# Patient Record
Sex: Male | Born: 1950 | Race: Black or African American | Hispanic: No | Marital: Single | Smoking: Former smoker
Health system: Southern US, Community
[De-identification: ages and names within clinical notes are randomized; demographics above are authoritative.]

## PROBLEM LIST (undated history)

## (undated) DIAGNOSIS — J302 Other seasonal allergic rhinitis: Secondary | ICD-10-CM

## (undated) DIAGNOSIS — R208 Other disturbances of skin sensation: Secondary | ICD-10-CM

## (undated) DIAGNOSIS — J301 Allergic rhinitis due to pollen: Secondary | ICD-10-CM

## (undated) DIAGNOSIS — M069 Rheumatoid arthritis, unspecified: Secondary | ICD-10-CM

## (undated) DIAGNOSIS — J939 Pneumothorax, unspecified: Secondary | ICD-10-CM

## (undated) DIAGNOSIS — Z823 Family history of stroke: Secondary | ICD-10-CM

## (undated) DIAGNOSIS — J3089 Other allergic rhinitis: Secondary | ICD-10-CM

## (undated) DIAGNOSIS — Z833 Family history of diabetes mellitus: Secondary | ICD-10-CM

## (undated) DIAGNOSIS — F17220 Nicotine dependence, chewing tobacco, uncomplicated: Secondary | ICD-10-CM

## (undated) DIAGNOSIS — E538 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: Secondary | ICD-10-CM

## (undated) DIAGNOSIS — J449 Chronic obstructive pulmonary disease, unspecified: Secondary | ICD-10-CM

## (undated) DIAGNOSIS — I509 Heart failure, unspecified: Secondary | ICD-10-CM

## (undated) HISTORY — DX: Family history of diabetes mellitus: Z83.3

## (undated) HISTORY — DX: Family history of stroke: Z82.3

## (undated) HISTORY — DX: Nicotine dependence, chewing tobacco, uncomplicated: F17.220

## (undated) HISTORY — DX: Rheumatoid arthritis, unspecified: M06.9

## (undated) HISTORY — DX: Other disturbances of skin sensation: R20.8

## (undated) HISTORY — DX: Other allergic rhinitis: J30.89

## (undated) HISTORY — DX: Allergic rhinitis due to pollen: J30.1

## (undated) HISTORY — DX: 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: E53.8

## (undated) HISTORY — DX: Chronic obstructive pulmonary disease, unspecified: J44.9

## (undated) HISTORY — DX: Other seasonal allergic rhinitis: J30.2

## (undated) HISTORY — PX: DENTAL SURGERY: SHX609

---
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
## 2007-01-12 ENCOUNTER — Emergency Department (HOSPITAL_COMMUNITY): Admission: EM | Admit: 2007-01-12 | Discharge: 2007-01-12 | Payer: Self-pay | Admitting: Emergency Medicine

## 2009-09-08 DIAGNOSIS — Z833 Family history of diabetes mellitus: Secondary | ICD-10-CM

## 2009-09-08 DIAGNOSIS — M069 Rheumatoid arthritis, unspecified: Secondary | ICD-10-CM

## 2009-09-08 DIAGNOSIS — Z823 Family history of stroke: Secondary | ICD-10-CM

## 2009-09-08 DIAGNOSIS — J301 Allergic rhinitis due to pollen: Secondary | ICD-10-CM

## 2009-09-08 HISTORY — DX: Rheumatoid arthritis, unspecified: M06.9

## 2009-09-08 HISTORY — DX: Family history of stroke: Z82.3

## 2009-09-08 HISTORY — DX: Allergic rhinitis due to pollen: J30.1

## 2009-09-08 HISTORY — DX: Family history of diabetes mellitus: Z83.3

## 2011-11-07 DIAGNOSIS — E538 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: Secondary | ICD-10-CM

## 2011-11-07 HISTORY — DX: 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: E53.8

## 2012-05-10 ENCOUNTER — Emergency Department (HOSPITAL_COMMUNITY)
Admission: EM | Admit: 2012-05-10 | Discharge: 2012-05-10 | Disposition: A | Discharge status: Home / Self Care | Payer: Managed Care, Other (non HMO) | Attending: Emergency Medicine | Admitting: Emergency Medicine

## 2012-05-10 ENCOUNTER — Emergency Department (HOSPITAL_COMMUNITY): Payer: Managed Care, Other (non HMO)

## 2012-05-10 ENCOUNTER — Encounter (HOSPITAL_COMMUNITY): Payer: Self-pay | Admitting: *Deleted

## 2012-05-10 DIAGNOSIS — S61412A Laceration without foreign body of left hand, initial encounter: Secondary | ICD-10-CM

## 2012-05-10 DIAGNOSIS — S61409A Unspecified open wound of unspecified hand, initial encounter: Secondary | ICD-10-CM | POA: Insufficient documentation

## 2012-05-10 DIAGNOSIS — W298XXA Contact with other powered powered hand tools and household machinery, initial encounter: Secondary | ICD-10-CM | POA: Insufficient documentation

## 2012-05-10 DIAGNOSIS — Z23 Encounter for immunization: Secondary | ICD-10-CM | POA: Insufficient documentation

## 2012-05-10 MED ORDER — TETANUS-DIPHTH-ACELL PERTUSSIS 5-2.5-18.5 LF-MCG/0.5 IM SUSP
0.5000 mL | Freq: Once | INTRAMUSCULAR | Status: AC
  Administered 2012-05-10: 0.5 mL via INTRAMUSCULAR

## 2012-05-10 MED ORDER — TETANUS-DIPHTH-ACELL PERTUSSIS 5-2.5-18.5 LF-MCG/0.5 IM SUSP
INTRAMUSCULAR | Status: AC
  Filled 2012-05-10: qty 0.5

## 2012-05-10 MED ORDER — LIDOCAINE HCL (PF) 1 % IJ SOLN
5.0000 mL | Freq: Once | INTRAMUSCULAR | Status: AC
  Administered 2012-05-10: 20:00:00

## 2012-05-10 MED ORDER — LIDOCAINE HCL (PF) 1 % IJ SOLN
INTRAMUSCULAR | Status: AC
  Filled 2012-05-10: qty 5

## 2012-05-10 MED ORDER — DOUBLE ANTIBIOTIC 500-10000 UNIT/GM EX OINT: TOPICAL_OINTMENT | Freq: Once | CUTANEOUS | Status: DC

## 2012-05-10 MED ORDER — BACITRACIN ZINC 500 UNIT/GM EX OINT
TOPICAL_OINTMENT | CUTANEOUS | Status: AC
  Administered 2012-05-10: 1
  Filled 2012-05-10: qty 0.9

## 2012-05-10 NOTE — ED Notes (Signed)
Lt hand cut with circular saw.

## 2012-05-10 NOTE — ED Provider Notes (Signed)
History     CSN: [id number]  Arrival date & time 05/10/12  4696   None     Chief Complaint  Patient presents with  . Hand Injury    (Consider location/radiation/quality/duration/timing/severity/associated sxs/prior treatment) HPI Comments: R hand dominant.  ? Last dT.  No other injuries or complaints.  Patient is a 61 y.o. male presenting with hand injury. The history is provided by the patient. No language interpreter was used.  Hand Injury  The incident occurred 1 to 2 hours ago. The incident occurred at home. Injury mechanism: fine-toothed circular saw. The pain is present in the left hand. The quality of the pain is described as sharp. The pain is at a severity of 4/10. The pain has been constant since the incident. Pertinent negatives include no fever. He reports no foreign bodies present. The symptoms are aggravated by movement and palpation. He has tried nothing for the symptoms.    History reviewed. No pertinent past medical history.  History reviewed. No pertinent past surgical history.  History reviewed. No pertinent family history.  History  Substance Use Topics  . Smoking status: Never Smoker   . Smokeless tobacco: Not on file  . Alcohol Use: No      Review of Systems  Constitutional: Negative for fever.  Skin: Positive for wound.  Neurological: Negative for weakness and numbness.  All other systems reviewed and are negative.    Allergies  Review of patient's allergies indicates no known allergies.  Home Medications   Current Outpatient Rx  Name Route Sig Dispense Refill  . CHLORPHEN-PHENYLEPH-IBUPROFEN 4-10-200 MG PO TABS Oral Take 1 tablet by mouth daily as needed. For congestion    . DAYQUIL PO Oral Take 1 capsule by mouth daily as needed. For congestion      BP 165/98  Pulse 90  Temp 98.4 F (36.9 C) (Oral)  Resp 20  Ht 5' 11.5" (1.816 m)  Wt 171 lb (77.565 kg)  BMI 23.52 kg/m2  SpO2 97%  Physical Exam  Nursing note and vitals  reviewed. Constitutional: He is oriented to person, place, and time. He appears well-developed and well-nourished.  HENT:  Head: Normocephalic and atraumatic.  Eyes: EOM are normal.  Neck: Normal range of motion.  Cardiovascular: Normal rate, regular rhythm, normal heart sounds and intact distal pulses.   Pulmonary/Chest: Effort normal and breath sounds normal. No respiratory distress.  Abdominal: Soft. He exhibits no distension. There is no tenderness.  Musculoskeletal: Normal range of motion. He exhibits tenderness.       Left hand: He exhibits tenderness and laceration. He exhibits normal range of motion, no bony tenderness, normal capillary refill, no deformity and no swelling. normal sensation noted. Normal strength noted.       Hands: Neurological: He is alert and oriented to person, place, and time.  Skin: Skin is warm and dry.  Psychiatric: He has a normal mood and affect. Judgment normal.    ED Course  LACERATION REPAIR Date/Time: 05/10/2012 8:15 PM Performed by: Evalina Field Authorized by: Evalina Field Consent: Verbal consent obtained. Written consent not obtained. Risks and benefits: risks, benefits and alternatives were discussed Consent given by: patient Patient understanding: patient states understanding of the procedure being performed Patient consent: the patient's understanding of the procedure matches consent given Site marked: the operative site was not marked Imaging studies: imaging studies available Patient identity confirmed: verbally with patient Body area: upper extremity Location details: left hand Laceration length: 3 cm Foreign bodies: no foreign bodies Tendon  involvement: none Nerve involvement: none Vascular damage: no Anesthesia: local infiltration Local anesthetic: lidocaine 1% without epinephrine Anesthetic total: 3 ml Patient sedated: no Irrigation solution: saline Irrigation method: syringe Amount of cleaning: standard Debridement:  none Degree of undermining: none Skin closure: 4-0 Prolene Number of sutures: 6 Technique: simple Approximation: close Approximation difficulty: simple Dressing: antibiotic ointment Patient tolerance: Patient tolerated the procedure well with no immediate complications.   (including critical care time)  Labs Reviewed - No data to display Dg Hand Complete Left  05/10/2012  *RADIOLOGY REPORT*  Clinical Data: Laceration.  Pain in second finger.  LEFT HAND - COMPLETE 3+ VIEW  Comparison: None.  Findings: Bandaging noted.  Degenerative arthropathy noted at the first carpometacarpal articulation.  Mild spurring along the distal ulna noted.  No fracture or metal foreign body observed.  IMPRESSION:  1.  No acute bony findings are metal foreign body observed. 2.  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   Original Report Authenticated By: Dellia Cloud, M.D.      1. Laceration of left hand       MDM  No fx's  Suture removal in 8-10 days.        Evalina Field, [place] 05/10/12 2045

## 2012-05-10 NOTE — ED Notes (Signed)
Pt with app. Inch laceration to left hand from circular saw, unknown of tetanus shot, sterile gauze soaked with saline to wound and wrapped with kling

## 2012-05-11 NOTE — ED Provider Notes (Signed)
Medical screening examination/treatment/procedure(s) were performed by non-physician practitioner and as supervising physician I was immediately available for consultation/collaboration.  Brittnei Jagiello, MD 05/11/12 0107 

## 2012-11-08 DIAGNOSIS — R208 Other disturbances of skin sensation: Secondary | ICD-10-CM

## 2012-11-08 DIAGNOSIS — J3089 Other allergic rhinitis: Secondary | ICD-10-CM

## 2012-11-08 DIAGNOSIS — J302 Other seasonal allergic rhinitis: Secondary | ICD-10-CM

## 2012-11-08 HISTORY — DX: Other disturbances of skin sensation: R20.8

## 2012-11-08 HISTORY — DX: Other seasonal allergic rhinitis: J30.2

## 2012-11-08 HISTORY — DX: Other allergic rhinitis: J30.89

## 2012-12-03 ENCOUNTER — Ambulatory Visit (HOSPITAL_COMMUNITY)
Admission: RE | Admit: 2012-12-03 | Discharge: 2012-12-03 | Disposition: A | Discharge status: Home / Self Care | Payer: 59 | Source: Ambulatory Visit | Attending: Family Medicine | Admitting: Family Medicine

## 2012-12-03 ENCOUNTER — Other Ambulatory Visit (HOSPITAL_COMMUNITY): Payer: Self-pay | Admitting: Family Medicine

## 2012-12-03 DIAGNOSIS — Z8701 Personal history of pneumonia (recurrent): Secondary | ICD-10-CM | POA: Insufficient documentation

## 2012-12-03 DIAGNOSIS — R0602 Shortness of breath: Secondary | ICD-10-CM

## 2013-03-14 DIAGNOSIS — J449 Chronic obstructive pulmonary disease, unspecified: Secondary | ICD-10-CM | POA: Diagnosis present

## 2013-03-14 HISTORY — DX: Chronic obstructive pulmonary disease, unspecified: J44.9

## 2015-10-29 ENCOUNTER — Encounter (HOSPITAL_COMMUNITY): Payer: Self-pay | Admitting: Emergency Medicine

## 2015-10-29 ENCOUNTER — Emergency Department (HOSPITAL_COMMUNITY)
Admission: EM | Admit: 2015-10-29 | Discharge: 2015-10-29 | Disposition: A | Discharge status: Home / Self Care | Payer: Worker's Compensation | Attending: Emergency Medicine | Admitting: Emergency Medicine

## 2015-10-29 DIAGNOSIS — Y999 Unspecified external cause status: Secondary | ICD-10-CM | POA: Insufficient documentation

## 2015-10-29 DIAGNOSIS — Z79899 Other long term (current) drug therapy: Secondary | ICD-10-CM | POA: Insufficient documentation

## 2015-10-29 DIAGNOSIS — Z23 Encounter for immunization: Secondary | ICD-10-CM | POA: Diagnosis not present

## 2015-10-29 DIAGNOSIS — Y9389 Activity, other specified: Secondary | ICD-10-CM | POA: Diagnosis not present

## 2015-10-29 DIAGNOSIS — IMO0002 Reserved for concepts with insufficient information to code with codable children: Secondary | ICD-10-CM

## 2015-10-29 DIAGNOSIS — Y9289 Other specified places as the place of occurrence of the external cause: Secondary | ICD-10-CM | POA: Diagnosis not present

## 2015-10-29 DIAGNOSIS — W268XXA Contact with other sharp object(s), not elsewhere classified, initial encounter: Secondary | ICD-10-CM | POA: Diagnosis not present

## 2015-10-29 DIAGNOSIS — S61215A Laceration without foreign body of left ring finger without damage to nail, initial encounter: Secondary | ICD-10-CM | POA: Diagnosis not present

## 2015-10-29 MED ORDER — LIDOCAINE HCL (PF) 2 % IJ SOLN
10.0000 mL | Freq: Once | INTRAMUSCULAR | Status: DC
  Filled 2015-10-29 (×2): qty 10

## 2015-10-29 MED ORDER — TETANUS-DIPHTH-ACELL PERTUSSIS 5-2.5-18.5 LF-MCG/0.5 IM SUSP
0.5000 mL | Freq: Once | INTRAMUSCULAR | Status: AC
  Administered 2015-10-29: 0.5 mL via INTRAMUSCULAR
  Filled 2015-10-29: qty 0.5

## 2015-10-29 NOTE — ED Notes (Signed)
PT c/o laceration to left hand ring finger on a metal broom handle per pt. Bleeding controlled at this time and pad of finger noted to have laceration.

## 2015-10-31 NOTE — ED Provider Notes (Signed)
CSN: [id number]     Arrival date & time 10/29/15  1059 History   First MD Initiated Contact with Patient 10/29/15 1231     Chief Complaint  Patient presents with  . Extremity Laceration     (Consider location/radiation/quality/duration/timing/severity/associated sxs/prior Treatment) The history is provided by the patient.   Derrick [name] is a 65 y.o. male presenting with laceration to his left ring finger, distal volar phalanx which he caught on a sharp metal edge of a broom he was using to sweep at work this am.  He has applied direct pressure to the wound which continues to bleed when not held.  He denies significant pain at the site.  He is not on any blood thinning medicines.  His tetanus is out of date.    History reviewed. No pertinent past medical history. Past Surgical History  Procedure Laterality Date  . Dental surgery     History reviewed. No pertinent family history. Social History  Substance Use Topics  . Smoking status: Never Smoker   . Smokeless tobacco: None  . Alcohol Use: No    Review of Systems  Constitutional: Negative for fever and chills.  Respiratory: Negative.   Cardiovascular: Negative.   Skin: Positive for wound.  Neurological: Negative for numbness.      Allergies  Review of patient's allergies indicates no known allergies.  Home Medications   Prior to Admission medications   Medication Sig Start Date End Date Taking? Authorizing Provider  furosemide (LASIX) 40 MG tablet TAKE ONE TABLET BY MOUTH DAILY IF NEEDED FOR FLUID. 09/28/15  Yes Historical Provider, MD  KLOR-CON M20 20 MEQ tablet TAKE ONE TABLET BY MOUTH DAILY TO REPLACE POTASSIUM. 09/28/15  Yes Historical Provider, MD  Pseudoephedrine-APAP-DM (DAYQUIL PO) Take 1 capsule by mouth daily.   Yes Historical Provider, MD   BP 148/104 mmHg  Pulse 84  Temp(Src) 98.7 F (37.1 C) (Temporal)  Resp 18  SpO2 94% Physical Exam  Constitutional: He is oriented to person, place, and time. He  appears well-developed and well-nourished.  HENT:  Head: Normocephalic.  Cardiovascular: Normal rate.   Pulmonary/Chest: Effort normal.  Neurological: He is alert and oriented to person, place, and time. No sensory deficit.  Skin: Laceration noted.  2 cm laceration left distal ring finger, irregular wound, not hemostatic, no arterial bleeding.  Clean edges.     ED Course  Procedures (including critical care time)  LACERATION REPAIR Performed by: Burgess Amor Authorized by: Burgess Amor Consent: Verbal consent obtained. Risks and benefits: risks, benefits and alternatives were discussed Consent given by: patient Patient identity confirmed: provided demographic data Prepped and Draped in normal sterile fashion Wound explored - no fb, ring tourniquet used to visualize base,  No deep structures involved, lac through fatty layer only.  Laceration Location: left ring finger  Laceration Length: 2cm  No Foreign Bodies seen or palpated  Anesthesia: digital block  Local anesthetic: lidocaine 2% without epinephrine  Anesthetic total: 2 ml  Irrigation method: syringe using saline after saf clens wound spray Amount of cleaning: standard  Skin closure: ethilon 4-0  Number of sutures: 5  Technique: simple interrupted.  Patient tolerance: Patient tolerated the procedure well with no immediate complications.  Labs Review Labs Reviewed - No data to display  Imaging Review No results found. I have personally reviewed and evaluated these images and lab results as part of my medical decision-making.   EKG Interpretation None      MDM   Final diagnoses:  Laceration    Wound care instructions given.  Pt advised to have sutures removed in 10 days,  Return here sooner for any signs of infection including redness, swelling, worse pain or drainage of pus. Tetanus updated.       Burgess Amor, PA-C 10/31/15 1610  Azalia Bilis, MD 10/31/15 [phone]

## 2016-05-30 ENCOUNTER — Ambulatory Visit (HOSPITAL_COMMUNITY)
Admission: RE | Admit: 2016-05-30 | Discharge: 2016-05-30 | Disposition: A | Discharge status: Home / Self Care | Payer: Self-pay | Source: Ambulatory Visit | Attending: Family Medicine | Admitting: Family Medicine

## 2016-05-30 ENCOUNTER — Other Ambulatory Visit (HOSPITAL_COMMUNITY): Payer: Self-pay | Admitting: Family Medicine

## 2016-05-30 DIAGNOSIS — J449 Chronic obstructive pulmonary disease, unspecified: Secondary | ICD-10-CM

## 2016-05-30 DIAGNOSIS — I517 Cardiomegaly: Secondary | ICD-10-CM | POA: Insufficient documentation

## 2016-06-14 ENCOUNTER — Other Ambulatory Visit: Payer: Self-pay

## 2016-06-15 ENCOUNTER — Ambulatory Visit (HOSPITAL_COMMUNITY)
Admission: RE | Admit: 2016-06-15 | Discharge: 2016-06-15 | Disposition: A | Discharge status: Home / Self Care | Payer: BLUE CROSS/BLUE SHIELD | Source: Ambulatory Visit | Attending: Family Medicine | Admitting: Family Medicine

## 2016-06-19 NOTE — Progress Notes (Signed)
Cardiology Office Note   Date:  06/20/2016   ID:  Magnum, Lunde 1951/02/10, MRN [id number]  PCP:  Milana Obey, MD  Cardiologist:   [name], MD   No chief complaint on file.     History of Present Illness: Derrick [name] is a 65 y.o. male who presents for evaluation of dyspnea . History of COPD.  Seen by Dr Sudie Bailey 05/29/16 complained of more dyspnea and LE edema Had been on PRN lasix 40 mg  Some improvement when started taking it He continues to work at [employer] 7 days a week Diet Way too much salt including barbecue Not much change in weight 155-158 but 145 range a year ago  Also given augmentin with diuretic   CXR done 05/30/16: FINDINGS: Extensive bullous emphysematous disease is present. There is marked cardiomegaly without edema. No pneumothorax or pleural effusion. Aortic atherosclerosis is noted.  Labs Reviewed:  05/30/16  LDL 77 CR .93 BNP 2724.  Hct 50.7 Hb 17.2 TSH 1.96  ANA positive speckled 1:80   Apparently he has been [facility] twice to get echo but they told him he did n't have appt I arranged for him to have one now   We were able to get echo today and EF 45-50% severe LVH Moderate AR and mild MR Some concern for amyloid.   Patient was very frustrated getting echo. Discussed possible need for MRI  For now will optimize meds  Past Medical History:  Diagnosis Date  . Allergic rhinitis due to pollen 09/08/2009  . COPD (chronic obstructive pulmonary disease) (HCC) 03/14/2013  . 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 Family history of diabetes mellitus 09/08/2009   father  . Family history of stroke (cerebrovascular) 09/08/2009   father  . Nicotine dependence, chewing tobacco, uncomplicated 08/07/1968  . Other disturbances of skin sensation 11/08/2012  . Perennial allergic rhinitis with seasonal variation 11/08/2012  . RA (rheumatoid arthritis) (HCC) 09/08/2009    Past Surgical History:  Procedure  Laterality Date  . DENTAL SURGERY       Current Outpatient Prescriptions  Medication Sig Dispense Refill  . albuterol (PROVENTIL) (2.5 MG/3ML) 0.083% nebulizer solution INHALATION USE ONE AMPULE IN YOUR NEBULIZER UP TO 4 TIMES A DAY FOR BREATHING.  11  . [facility] VITAMIN B12 1000 MCG tablet TAKE 2 TABLETS EACH DAY FOR VITAMIN B12 DEFICIENCY.  11  . fluticasone (FLONASE) 50 MCG/ACT nasal spray USE 2 SPRAYS IN EACH NOSTRIL ONCE A DAY TO PREVENT NASAL ALLERGIES.  11  . furosemide (LASIX) 40 MG tablet TAKE ONE TABLET BY MOUTH DAILY IF NEEDED FOR FLUID. 30 tablet 3  . KLOR-CON M20 20 MEQ tablet TAKE ONE TABLET BY MOUTH DAILY TO REPLACE POTASSIUM. 30 tablet 3  . Pseudoephedrine-APAP-DM (DAYQUIL PO) Take 1 capsule by mouth daily.    Marland Kitchen QVAR 80 MCG/ACT inhaler INHALE 2 PUFFS ONCE A DAY TO PREVENT BREATHING PROBLEMS.  11  . carvedilol (COREG) 3.125 MG tablet Take 1 tablet (3.125 mg total) by mouth 2 (two) times daily. 60 tablet 3  . lisinopril (PRINIVIL,ZESTRIL) 2.5 MG tablet Take 1 tablet (2.5 mg total) by mouth daily. 90 tablet 3   No current facility-administered medications for this visit.     Allergies:   Patient has no known allergies.    Social History:  The patient  reports that he has never smoked. He has never used smokeless tobacco. He reports that he does not drink alcohol or  use drugs.   Family History:  The patient's Family history is unknown by patient.    ROS:  Please see the history of present illness.   Otherwise, review of systems are positive for none.   All other systems are reviewed and negative.    PHYSICAL EXAM: VS:  BP 122/64   Pulse (!) 58   Ht 5\' 9"  (1.753 m)   Wt 68 kg (150 lb)   SpO2 93%   BMI 22.15 kg/m  , BMI Body mass index is 22.15 kg/m. Affect appropriate Thin black male  HEENT: normal Neck supple with no adenopathy JVP normal no bruits no thyromegaly Lungs clear with no wheezing and good diaphragmatic motion Heart:  S1/S2 AR  murmur, no rub, gallop or  click PMI normal Abdomen: benighn, BS positve, no tenderness, no AAA no bruit.  No HSM or HJR Distal pulses intact with no bruits Plus one bilateral  edema Neuro non-focal Skin warm and dry No muscular weakness    EKG:      Recent Labs: No results found for requested labs within last 8760 hours.    Lipid Panel No results found for: CHOL, TRIG, HDL, CHOLHDL, VLDL, LDLCALC, LDLDIRECT    Wt Readings from Last 3 Encounters:  06/20/16 68 kg (150 lb)  05/10/12 77.6 kg (171 lb)      Other studies Reviewed: Additional studies/ records that were reviewed today include: Echo notes primary CXR labs .    ASSESSMENT AND PLAN:  1. CHF:  Echo with EF 45050% severe LVH cannot r/o infiltrative DCM like amyloid. Add coreg 3.125 bid and lisinopril 2.5 Refill lasix and K Don't have much BP to work with. F/U BMET BNP in 3 weeks f/u clinic 6-8 weeks further discuss cardiac MRI r/o amyloid / HOCM since he has severe septal thickness and not severe HTN in setting of new onset CHF 2. 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  The patient does not have concerns regarding medicines.  The following changes have been made:  Coreg 3.125 bid lisinoprol 2.5 daily   Labs/ tests ordered today include: BMET BNP   Orders Placed This Encounter  Procedures  . Basic Metabolic Panel (BMET)  . Basic Metabolic Panel (BMET)  . ECHOCARDIOGRAM COMPLETE     Disposition:   FU with 6-8 weeks      Signed, 07/10/12, MD  06/20/2016 5:00 PM    [facility] [address] [place], [place], [place]  [place] Phone: [phone]; Fax: [phone]

## 2016-06-20 ENCOUNTER — Ambulatory Visit (INDEPENDENT_AMBULATORY_CARE_PROVIDER_SITE_OTHER): Payer: BLUE CROSS/BLUE SHIELD | Admitting: Cardiovascular Disease

## 2016-06-20 ENCOUNTER — Ambulatory Visit (HOSPITAL_COMMUNITY)
Admission: RE | Admit: 2016-06-20 | Discharge: 2016-06-20 | Disposition: A | Discharge status: Home / Self Care | Payer: BLUE CROSS/BLUE SHIELD | Source: Ambulatory Visit | Attending: Cardiovascular Disease | Admitting: Cardiovascular Disease

## 2016-06-20 ENCOUNTER — Encounter: Payer: Self-pay | Admitting: *Deleted

## 2016-06-20 ENCOUNTER — Encounter: Payer: Self-pay | Admitting: Cardiovascular Disease

## 2016-06-20 VITALS — BP 122/64 | HR 58 | Ht 69.0 in | Wt 150.0 lb

## 2016-06-20 DIAGNOSIS — I340 Nonrheumatic mitral (valve) insufficiency: Secondary | ICD-10-CM | POA: Diagnosis not present

## 2016-06-20 DIAGNOSIS — I351 Nonrheumatic aortic (valve) insufficiency: Secondary | ICD-10-CM | POA: Insufficient documentation

## 2016-06-20 DIAGNOSIS — Z79899 Other long term (current) drug therapy: Secondary | ICD-10-CM

## 2016-06-20 DIAGNOSIS — R079 Chest pain, unspecified: Secondary | ICD-10-CM | POA: Insufficient documentation

## 2016-06-20 DIAGNOSIS — I517 Cardiomegaly: Secondary | ICD-10-CM | POA: Insufficient documentation

## 2016-06-20 MED ORDER — KLOR-CON M20 20 MEQ PO TBCR: EXTENDED_RELEASE_TABLET | ORAL | 3 refills | Status: DC

## 2016-06-20 MED ORDER — LISINOPRIL 2.5 MG PO TABS: 2.5000 mg | ORAL_TABLET | Freq: Every day | ORAL | 3 refills | Status: DC

## 2016-06-20 MED ORDER — FUROSEMIDE 40 MG PO TABS: ORAL_TABLET | ORAL | 3 refills | Status: DC

## 2016-06-20 MED ORDER — CARVEDILOL 3.125 MG PO TABS: 3.1250 mg | ORAL_TABLET | Freq: Two times a day (BID) | ORAL | 3 refills | Status: DC

## 2016-06-20 NOTE — Progress Notes (Signed)
*  PRELIMINARY RESULTS* Echocardiogram 2D Echocardiogram has been performed.  Stacey Drain 06/20/2016, 4:02 PM

## 2016-06-20 NOTE — Patient Instructions (Signed)
Medication Instructions:  START LISINOPRIL 2.5 MG DAILY START COREG 3.125 MG - TAKE 2 TIMES DAILY  I REFILLED LASIX AND POTASSIUM   Labwork: Your physician recommends that you return for lab work in: 3 WEEKS BMET BMP   Testing/Procedures: NONE  Follow-Up: Your physician recommends that you schedule a follow-up appointment in: 6-8 WEEKS    Any Other Special Instructions Will Be Listed Below (If Applicable).     If you need a refill on your cardiac medications before your next appointment, please call your pharmacy.

## 2016-06-28 ENCOUNTER — Other Ambulatory Visit (HOSPITAL_COMMUNITY): Payer: Self-pay

## 2016-07-04 LAB — BASIC METABOLIC PANEL
BUN: 22 mg/dL (ref 7–25)
CHLORIDE: 100 mmol/L (ref 98–110)
CO2: 34 mmol/L — AB (ref 20–31)
CREATININE: 1.1 mg/dL (ref 0.70–1.25)
Calcium: 8.9 mg/dL (ref 8.6–10.3)
Glucose, Bld: 88 mg/dL (ref 65–99)
Potassium: 4.9 mmol/L (ref 3.5–5.3)
Sodium: 141 mmol/L (ref 135–146)

## 2016-07-05 ENCOUNTER — Other Ambulatory Visit (HOSPITAL_COMMUNITY): Payer: Self-pay

## 2016-07-18 LAB — BASIC METABOLIC PANEL
BUN: 27 mg/dL — AB (ref 7–25)
CALCIUM: 9.1 mg/dL (ref 8.6–10.3)
CO2: 33 mmol/L — ABNORMAL HIGH (ref 20–31)
CREATININE: 1.24 mg/dL (ref 0.70–1.25)
Chloride: 100 mmol/L (ref 98–110)
GLUCOSE: 73 mg/dL (ref 65–99)
POTASSIUM: 4.9 mmol/L (ref 3.5–5.3)
Sodium: 144 mmol/L (ref 135–146)

## 2016-07-19 ENCOUNTER — Telehealth: Payer: Self-pay | Admitting: Cardiovascular Disease

## 2016-07-19 NOTE — Telephone Encounter (Signed)
Follow-up   Pt returning call, if not at home call [phone]

## 2016-07-20 NOTE — Telephone Encounter (Signed)
Left message for patient to call back  

## 2016-07-21 NOTE — Telephone Encounter (Signed)
Derrick [name] is returning a call .Marland Kitchen Thanks

## 2016-07-21 NOTE — Telephone Encounter (Signed)
Called patient back with lab results. 

## 2016-08-10 ENCOUNTER — Ambulatory Visit: Payer: BLUE CROSS/BLUE SHIELD | Admitting: Cardiology

## 2016-09-18 ENCOUNTER — Other Ambulatory Visit: Payer: Self-pay | Admitting: *Deleted

## 2016-09-18 MED ORDER — CARVEDILOL 3.125 MG PO TABS: 3.1250 mg | ORAL_TABLET | Freq: Two times a day (BID) | ORAL | 2 refills | Status: DC

## 2016-09-29 ENCOUNTER — Ambulatory Visit (INDEPENDENT_AMBULATORY_CARE_PROVIDER_SITE_OTHER): Payer: BLUE CROSS/BLUE SHIELD | Admitting: Cardiology

## 2016-09-29 ENCOUNTER — Encounter: Payer: Self-pay | Admitting: Cardiology

## 2016-09-29 VITALS — BP 104/60 | HR 75 | Ht 71.0 in | Wt 145.0 lb

## 2016-09-29 DIAGNOSIS — I517 Cardiomegaly: Secondary | ICD-10-CM

## 2016-09-29 DIAGNOSIS — R0600 Dyspnea, unspecified: Secondary | ICD-10-CM | POA: Diagnosis not present

## 2016-09-29 DIAGNOSIS — I5022 Chronic systolic (congestive) heart failure: Secondary | ICD-10-CM

## 2016-09-29 NOTE — Progress Notes (Signed)
Clinical Summary Mr. Derrick [name] is a 66 y.o.male last seen by Dr Eden Emms, this is our first visit together.  1. Dyspnea - echo 06/2016 LVEF 45-50%, diffuse hypokinesis, cannot eval diastolic function. Moderate AI, mild MR. PASP 50 (not 150) - severe LVH. Septum 20 mm, posterior wall 18 mm - DOE with 1 flight of stairs. Varaible depending on his feeling of chest congestions.  - mixed compliance with Qvar - no FH of heart conditions.  - no chest pain. No palpitations.     Past Medical History:  Diagnosis Date  . Allergic rhinitis due to pollen 09/08/2009  . COPD (chronic obstructive pulmonary disease) (HCC) 03/14/2013  . 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 Family history of diabetes mellitus 09/08/2009   father  . Family history of stroke (cerebrovascular) 09/08/2009   father  . Nicotine dependence, chewing tobacco, uncomplicated 08/07/1968  . Other disturbances of skin sensation 11/08/2012  . Perennial allergic rhinitis with seasonal variation 11/08/2012  . RA (rheumatoid arthritis) (HCC) 09/08/2009     No Known Allergies   Current Outpatient Prescriptions  Medication Sig Dispense Refill  . albuterol (PROVENTIL) (2.5 MG/3ML) 0.083% nebulizer solution INHALATION USE ONE AMPULE IN YOUR NEBULIZER UP TO 4 TIMES A DAY FOR BREATHING.  11  . carvedilol (COREG) 3.125 MG tablet Take 1 tablet (3.125 mg total) by mouth 2 (two) times daily. 180 tablet 2  . [facility]S VITAMIN B12 1000 MCG tablet TAKE 2 TABLETS EACH DAY FOR VITAMIN B12 DEFICIENCY.  11  . fluticasone (FLONASE) 50 MCG/ACT nasal spray USE 2 SPRAYS IN EACH NOSTRIL ONCE A DAY TO PREVENT NASAL ALLERGIES.  11  . furosemide (LASIX) 40 MG tablet TAKE ONE TABLET BY MOUTH DAILY IF NEEDED FOR FLUID. 30 tablet 3  . KLOR-CON M20 20 MEQ tablet TAKE ONE TABLET BY MOUTH DAILY TO REPLACE POTASSIUM. 30 tablet 3  . lisinopril (PRINIVIL,ZESTRIL) 2.5 MG tablet Take 1 tablet (2.5 mg total) by mouth daily. 90 tablet 3  .  Pseudoephedrine-APAP-DM (DAYQUIL PO) Take 1 capsule by mouth daily.    Marland Kitchen QVAR 80 MCG/ACT inhaler INHALE 2 PUFFS ONCE A DAY TO PREVENT BREATHING PROBLEMS.  11   No current facility-administered medications for this visit.      Past Surgical History:  Procedure Laterality Date  . DENTAL SURGERY       No Known Allergies    Family History  Problem Relation Age of Onset  . Family history unknown: Yes     Social History Mr. Tubbs reports that he has never smoked. He has never used smokeless tobacco. Mr. Warwick reports that he does not drink alcohol.   Review of Systems CONSTITUTIONAL: No weight loss, fever, chills, weakness or fatigue.  HEENT: Eyes: No visual loss, blurred vision, double vision or yellow sclerae.No hearing loss, sneezing, congestion, runny nose or sore throat.  SKIN: No rash or itching.  CARDIOVASCULAR: per HPI RESPIRATORY: No shortness of breath, cough or sputum.  GASTROINTESTINAL: No anorexia, nausea, vomiting or diarrhea. No abdominal pain or blood.  GENITOURINARY: No burning on urination, no polyuria NEUROLOGICAL: No headache, dizziness, syncope, paralysis, ataxia, numbness or tingling in the extremities. No change in bowel or bladder control.  MUSCULOSKELETAL: No muscle, back pain, joint pain or stiffness.  LYMPHATICS: No enlarged nodes. No history of splenectomy.  PSYCHIATRIC: No history of depression or anxiety.  ENDOCRINOLOGIC: No reports of sweating, cold or heat intolerance. No polyuria or polydipsia.  Marland Kitchen   Physical Examination Vitals:  09/29/16 1620  BP: 104/60  Pulse: 75   Vitals:   09/29/16 1620  Weight: 145 lb (65.8 kg)  Height: 5\' 11"  (1.803 m)    Gen: resting comfortably, no acute distress HEENT: no scleral icterus, pupils equal round and reactive, no palptable cervical adenopathy,  CV: RRR, no m/r/g, no jvd Resp: Clear to auscultation bilaterally GI: abdomen is soft, non-tender, non-distended, normal bowel sounds, no  hepatosplenomegaly MSK: extremities are warm, no edema.  Skin: warm, no rash Neuro:  no focal deficits Psych: appropriate affect    Assessment and Plan  1. LVH/Mild LV systolic dysfunction/Chronic systolic HF - echo with severe LVH septal 20 mm and posterior wall 18 mm in the absence of history of HTN along with LVH by EKG - concern for possible hypertrophic CM vs other infiltrative disease given degree of hypertrophy without underlying HTN. Will obtain cardiac MRI - continue medical therapy, limited by soft bp's  2. SOB - likely multifactorial. Encouraged increased compliance with inhalers. Furtther cardaic workup as described above  3. Pulmonary HTN - typo on echo, PASP should be reported as . I think the RV looks to be enlarged with dysfunction as well. This will be better evaluated by MRI as described above - may warrant RHC in the future depending on initial workup and symptoms. PMH history mentions history of rheumatoid arthritis that is unclear. Other causes for pulm HTN would include COPD, left sided heart disease.       , M.D.

## 2016-09-29 NOTE — Patient Instructions (Signed)
Your physician recommends that you schedule a follow-up appointment in: 1 month   Your physician recommends that you continue on your current medications as directed. Please refer to the Current Medication list given to you today.    Your physician has requested that you have a cardiac MRI. Cardiac MRI uses a computer to create images of your heart as its beating, producing both still and moving pictures of your heart and major blood vessels. For further information please visit [email or web address]. Please follow the instruction sheet given to you today for more information.       Thank you for choosing [facility] !

## 2016-10-05 ENCOUNTER — Encounter: Payer: Self-pay | Admitting: Cardiology

## 2016-10-10 ENCOUNTER — Ambulatory Visit (HOSPITAL_COMMUNITY): Admission: RE | Admit: 2016-10-10 | Payer: BLUE CROSS/BLUE SHIELD | Source: Ambulatory Visit

## 2016-10-13 ENCOUNTER — Other Ambulatory Visit: Payer: Self-pay | Admitting: Cardiovascular Disease

## 2016-11-07 ENCOUNTER — Ambulatory Visit (HOSPITAL_COMMUNITY): Admission: RE | Admit: 2016-11-07 | Payer: BLUE CROSS/BLUE SHIELD | Source: Ambulatory Visit

## 2016-11-17 ENCOUNTER — Ambulatory Visit: Payer: BLUE CROSS/BLUE SHIELD | Admitting: Cardiology

## 2016-11-17 NOTE — Progress Notes (Deleted)
Clinical Summary Derrick [name] is a 66 y.o.male last seen by Dr Eden Emms, this is our first visit together.  1. Dyspnea - echo 06/2016 LVEF 45-50%, diffuse hypokinesis, cannot eval diastolic function. Moderate AI, mild MR. PASP 50 (not 150) - severe LVH. Septum 20 mm, posterior wall 18 mm - DOE with 1 flight of stairs. Varaible depending on his feeling of chest congestions.  - mixed compliance with Qvar - no FH of heart conditions.  - no chest pain. No palpitations.    Past Medical History:  Diagnosis Date  . Allergic rhinitis due to pollen 09/08/2009  . COPD (chronic obstructive pulmonary disease) (HCC) 03/14/2013  . 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 Family history of diabetes mellitus 09/08/2009   father  . Family history of stroke (cerebrovascular) 09/08/2009   father  . Nicotine dependence, chewing tobacco, uncomplicated 08/07/1968  . Other disturbances of skin sensation 11/08/2012  . Perennial allergic rhinitis with seasonal variation 11/08/2012  . RA (rheumatoid arthritis) (HCC) 09/08/2009     No Known Allergies   Current Outpatient Prescriptions  Medication Sig Dispense Refill  . albuterol (PROVENTIL) (2.5 MG/3ML) 0.083% nebulizer solution INHALATION USE ONE AMPULE IN YOUR NEBULIZER UP TO 4 TIMES A DAY FOR BREATHING.  11  . carvedilol (COREG) 3.125 MG tablet Take 1 tablet (3.125 mg total) by mouth 2 (two) times daily. 180 tablet 2  . [facility] VITAMIN B12 1000 MCG tablet TAKE 2 TABLETS EACH DAY FOR VITAMIN B12 DEFICIENCY.  11  . fluticasone (FLONASE) 50 MCG/ACT nasal spray USE 2 SPRAYS IN EACH NOSTRIL ONCE A DAY TO PREVENT NASAL ALLERGIES.  11  . furosemide (LASIX) 40 MG tablet TAKE ONE TABLET BY MOUTH DAILY IF NEEDED FOR FLUID. 30 tablet 3  . KLOR-CON M20 20 MEQ tablet TAKE ONE TABLET BY MOUTH DAILY TO REPLACE POTASSIUM. 30 tablet 3  . lisinopril (PRINIVIL,ZESTRIL) 2.5 MG tablet Take 2.5 mg by mouth daily.    . Pseudoephedrine-APAP-DM  (DAYQUIL PO) Take 1 capsule by mouth daily.    Marland Kitchen QVAR 80 MCG/ACT inhaler INHALE 2 PUFFS ONCE A DAY TO PREVENT BREATHING PROBLEMS.  11   No current facility-administered medications for this visit.      Past Surgical History:  Procedure Laterality Date  . DENTAL SURGERY       No Known Allergies    Family History  Problem Relation Age of Onset  . Hypertension Mother   . CVA Father   . Heart attack Brother      Social History Mr. Lina reports that he has never smoked. He has never used smokeless tobacco. Mr. Heckmann reports that he does not drink alcohol.   Review of Systems CONSTITUTIONAL: No weight loss, fever, chills, weakness or fatigue.  HEENT: Eyes: No visual loss, blurred vision, double vision or yellow sclerae.No hearing loss, sneezing, congestion, runny nose or sore throat.  SKIN: No rash or itching.  CARDIOVASCULAR:  RESPIRATORY: No shortness of breath, cough or sputum.  GASTROINTESTINAL: No anorexia, nausea, vomiting or diarrhea. No abdominal pain or blood.  GENITOURINARY: No burning on urination, no polyuria NEUROLOGICAL: No headache, dizziness, syncope, paralysis, ataxia, numbness or tingling in the extremities. No change in bowel or bladder control.  MUSCULOSKELETAL: No muscle, back pain, joint pain or stiffness.  LYMPHATICS: No enlarged nodes. No history of splenectomy.  PSYCHIATRIC: No history of depression or anxiety.  ENDOCRINOLOGIC: No reports of sweating, cold or heat intolerance. No polyuria or polydipsia.  Marland Kitchen  Physical Examination There were no vitals filed for this visit. There were no vitals filed for this visit.  Gen: resting comfortably, no acute distress HEENT: no scleral icterus, pupils equal round and reactive, no palptable cervical adenopathy,  CV Resp: Clear to auscultation bilaterally GI: abdomen is soft, non-tender, non-distended, normal bowel sounds, no hepatosplenomegaly MSK: extremities are warm, no edema.  Skin: warm, no  rash Neuro:  no focal deficits Psych: appropriate affect   Diagnostic Studies     Assessment and Plan  1. LVH/Mild LV systolic dysfunction/Chronic systolic HF - echo with severe LVH septal 20 mm and posterior wall 18 mm in the absence of history of HTN along with LVH by EKG - concern for possible hypertrophic CM vs other infiltrative disease given degree of hypertrophy without underlying HTN. Will obtain cardiac MRI - continue medical therapy, limited by soft bp's  2. SOB - likely multifactorial. Encouraged increased compliance with inhalers. Furtther cardaic workup as described above  3. Pulmonary HTN - typo on echo, PASP should be reported as . I think the RV looks to be enlarged with dysfunction as well. This will be better evaluated by MRI as described above - may warrant RHC in the future depending on initial workup and symptoms. PMH history mentions history of rheumatoid arthritis that is unclear. Other causes for pulm HTN would include COPD, left sided heart disease.      Antoine Poche, M.D., F.A.C.C.

## 2016-11-24 ENCOUNTER — Other Ambulatory Visit: Payer: Self-pay

## 2016-11-24 MED ORDER — FUROSEMIDE 40 MG PO TABS: ORAL_TABLET | ORAL | 3 refills | Status: DC

## 2016-11-24 MED ORDER — KLOR-CON M20 20 MEQ PO TBCR: EXTENDED_RELEASE_TABLET | ORAL | 3 refills | Status: DC

## 2016-12-05 ENCOUNTER — Ambulatory Visit (HOSPITAL_COMMUNITY)
Admission: RE | Admit: 2016-12-05 | Discharge: 2016-12-05 | Disposition: A | Discharge status: Home / Self Care | Payer: BLUE CROSS/BLUE SHIELD | Source: Ambulatory Visit | Attending: Cardiology | Admitting: Cardiology

## 2016-12-05 DIAGNOSIS — I517 Cardiomegaly: Secondary | ICD-10-CM | POA: Diagnosis not present

## 2016-12-05 DIAGNOSIS — I7789 Other specified disorders of arteries and arterioles: Secondary | ICD-10-CM | POA: Insufficient documentation

## 2016-12-05 DIAGNOSIS — R0600 Dyspnea, unspecified: Secondary | ICD-10-CM | POA: Diagnosis not present

## 2016-12-05 DIAGNOSIS — I421 Obstructive hypertrophic cardiomyopathy: Secondary | ICD-10-CM | POA: Diagnosis not present

## 2016-12-05 DIAGNOSIS — I351 Nonrheumatic aortic (valve) insufficiency: Secondary | ICD-10-CM | POA: Insufficient documentation

## 2016-12-05 LAB — ECHOCARDIOGRAM COMPLETE
AV Mean grad: 7 mmHg
AV Peak grad: 13 mmHg
AV VEL mean LVOT/AV: 0.71
AV pk vel: 180 cm/s
AV vel: 3.13
AVAREAMEANV: 2.94 cm2
AVAREAMEANVIN: 1.62 cm2/m2
AVAREAVTI: 2.91 cm2
AVAREAVTIIND: 1.72 cm2/m2
AVLVOTPG: 6 mmHg
Ao pk vel: 0.7 m/s
CHL CUP AV PEAK INDEX: 1.6
CHL CUP DOP CALC LVOT VTI: 20 cm
CHL CUP MV DEC (S): 109
DOP CAL AO MEAN VELOCITY: 116 cm/s
EWDT: 109 ms
FS: 26 % — AB (ref 28–44)
Height: 69 in
IV/PV OW: 1.1
LA ID, A-P, ES: 38 mm
LA diam end sys: 38 mm
LA diam index: 2.09 cm/m2
LA vol A4C: 46.4 ml
LA vol index: 24.3 mL/m2
LAVOL: 44.2 mL
LDCA: 4.15 cm2
LV PW d: 18.1 mm — AB (ref 0.6–1.1)
LV SIMPSON'S DISK: 43
LV dias vol index: 80 mL/m2
LV dias vol: 145 mL (ref 62–150)
LV sys vol index: 46 mL/m2
LVOT SV: 83 mL
LVOT peak VTI: 0.75 cm
LVOTD: 23 mm
LVOTPV: 126 cm/s
LVSYSVOL: 83 mL — AB (ref 21–61)
MV pk E vel: 116 m/s
MVPG: 5 mmHg
P 1/2 time: 556 ms
RV LATERAL S' VELOCITY: 8.7 cm/s
RV sys press: 150 mmHg
Reg peak vel: 341 cm/s
Stroke v: 62 ml
TAPSE: 15.1 mm
TRMAXVEL: 341 cm/s
VTI: 26.5 cm
Valve area index: 1.72
Valve area: 3.13 cm2
Weight: 2400 oz

## 2016-12-05 LAB — CREATININE, SERUM
Creatinine, Ser: 1.01 mg/dL (ref 0.61–1.24)
GFR calc Af Amer: >60 mL/min (ref >60)

## 2016-12-05 MED ORDER — GADOBENATE DIMEGLUMINE 529 MG/ML IV SOLN
21.0000 mL | Freq: Once | INTRAVENOUS | Status: AC | PRN
  Administered 2016-12-05: 21 mL via INTRAVENOUS

## 2016-12-11 ENCOUNTER — Telehealth: Payer: Self-pay

## 2016-12-11 DIAGNOSIS — R9389 Abnormal findings on diagnostic imaging of other specified body structures: Secondary | ICD-10-CM

## 2016-12-11 NOTE — Telephone Encounter (Signed)
Labs ordered,lm for pt to cb-cc

## 2016-12-11 NOTE — Telephone Encounter (Signed)
-----   Message from Albertine Patricia, CMA sent at 12/11/2016  3:34 PM EDT -----   ----- Message ----- From: Antoine Poche, MD Sent: 12/11/2016   3:25 PM To: Staci T Ashworth, CMA  Cardiac MRI showed normal pumping function, along with thickened heart muscle. As additional workup please order a SPEP (serum protein electophoresis) and UPEP (urine protein electrophoresis).    Dina Rich MD

## 2016-12-12 ENCOUNTER — Telehealth: Payer: Self-pay

## 2016-12-12 NOTE — Telephone Encounter (Signed)
Called pt., no answer. Left message for pt to return call.  

## 2016-12-20 LAB — PROTEIN ELECTROPHORESIS, SERUM
ALBUMIN ELP: 3.7 g/dL — AB (ref 3.8–4.8)
ALPHA-1-GLOBULIN: 0.4 g/dL — AB (ref 0.2–0.3)
ALPHA-2-GLOBULIN: 0.7 g/dL (ref 0.5–0.9)
BETA GLOBULIN: 0.5 g/dL (ref 0.4–0.6)
Beta 2: 0.4 g/dL (ref 0.2–0.5)
Gamma Globulin: 0.9 g/dL (ref 0.8–1.7)
Total Protein, Serum Electrophoresis: 6.5 g/dL (ref 6.1–8.1)

## 2016-12-26 ENCOUNTER — Ambulatory Visit (INDEPENDENT_AMBULATORY_CARE_PROVIDER_SITE_OTHER): Payer: BLUE CROSS/BLUE SHIELD | Admitting: Cardiology

## 2016-12-26 ENCOUNTER — Encounter: Payer: Self-pay | Admitting: Cardiology

## 2016-12-26 VITALS — BP 130/80 | HR 72 | Ht 69.5 in | Wt 141.0 lb

## 2016-12-26 DIAGNOSIS — R0600 Dyspnea, unspecified: Secondary | ICD-10-CM | POA: Diagnosis not present

## 2016-12-26 DIAGNOSIS — I272 Pulmonary hypertension, unspecified: Secondary | ICD-10-CM | POA: Diagnosis not present

## 2016-12-26 DIAGNOSIS — I5022 Chronic systolic (congestive) heart failure: Secondary | ICD-10-CM | POA: Diagnosis not present

## 2016-12-26 DIAGNOSIS — I517 Cardiomegaly: Secondary | ICD-10-CM | POA: Diagnosis not present

## 2016-12-26 NOTE — Progress Notes (Signed)
Clinical Summary Derrick [name] is a 66 y.o.male seen today for follow up of the following medical problems.   1. Dyspnea - echo 06/2016 LVEF 45-50%, diffuse hypokinesis, cannot eval diastolic function. Moderate AI, mild MR. PASP 50 (not 150) - severe LVH. Septum 20 mm, posterior wall 18 mm - DOE with 1 flight of stairs. Varaible depending on his feeling of chest congestions.  - mixed compliance with Qvar - no FH of heart conditions.  - no chest pain. No palpitations.   - cardiac MRI 12/2016: moderate LV septal thickness 15 mm, posterior wall 9 mm. No SAM or LVOT gradient, gadolinium pattern not consistent with hypertrophic CM but consider amyloid. Moderate AI. Normal RV size and function. Normal atria.    - breathing is improved. No recent edema. Takes lasix 40mg  daily.   2. COPD - compliant with inhalers   Past Medical History:  Diagnosis Date  . Allergic rhinitis due to pollen 09/08/2009  . COPD (chronic obstructive pulmonary disease) (HCC) 03/14/2013  . 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 Family history of diabetes mellitus 09/08/2009   father  . Family history of stroke (cerebrovascular) 09/08/2009   father  . Nicotine dependence, chewing tobacco, uncomplicated 08/07/1968  . Other disturbances of skin sensation 11/08/2012  . Perennial allergic rhinitis with seasonal variation 11/08/2012  . RA (rheumatoid arthritis) (HCC) 09/08/2009     No Known Allergies   Current Outpatient Prescriptions  Medication Sig Dispense Refill  . albuterol (PROVENTIL) (2.5 MG/3ML) 0.083% nebulizer solution INHALATION USE ONE AMPULE IN YOUR NEBULIZER UP TO 4 TIMES A DAY FOR BREATHING.  11  . carvedilol (COREG) 3.125 MG tablet Take 1 tablet (3.125 mg total) by mouth 2 (two) times daily. 180 tablet 2  . CVS VITAMIN B12 1000 MCG tablet TAKE 2 TABLETS EACH DAY FOR VITAMIN B12 DEFICIENCY.  11  . fluticasone (FLONASE) 50 MCG/ACT nasal spray USE 2 SPRAYS IN EACH  NOSTRIL ONCE A DAY TO PREVENT NASAL ALLERGIES.  11  . furosemide (LASIX) 40 MG tablet TAKE ONE TABLET BY MOUTH DAILY IF NEEDED FOR FLUID. 30 tablet 3  . KLOR-CON M20 20 MEQ tablet TAKE ONE TABLET BY MOUTH DAILY TO REPLACE POTASSIUM. 30 tablet 3  . lisinopril (PRINIVIL,ZESTRIL) 2.5 MG tablet Take 2.5 mg by mouth daily.    . Pseudoephedrine-APAP-DM (DAYQUIL PO) Take 1 capsule by mouth daily.    11/06/2009 QVAR 80 MCG/ACT inhaler INHALE 2 PUFFS ONCE A DAY TO PREVENT BREATHING PROBLEMS.  11   No current facility-administered medications for this visit.      Past Surgical History:  Procedure Laterality Date  . DENTAL SURGERY       No Known Allergies    Family History  Problem Relation Age of Onset  . Hypertension Mother   . CVA Father   . Heart attack Brother      Social History Derrick [name] reports that he has never smoked. He has never used smokeless tobacco. Derrick [name] reports that he does not drink alcohol.   Review of Systems CONSTITUTIONAL: No weight loss, fever, chills, weakness or fatigue.  HEENT: Eyes: No visual loss, blurred vision, double vision or yellow sclerae.No hearing loss, sneezing, congestion, runny nose or sore throat.  SKIN: No rash or itching.  CARDIOVASCULAR: per HPI RESPIRATORY: No shortness of breath, cough or sputum.  GASTROINTESTINAL: No anorexia, nausea, vomiting or diarrhea. No abdominal pain or blood.  GENITOURINARY: No burning on urination, no polyuria NEUROLOGICAL: No  headache, dizziness, syncope, paralysis, ataxia, numbness or tingling in the extremities. No change in bowel or bladder control.  MUSCULOSKELETAL: No muscle, back pain, joint pain or stiffness.  LYMPHATICS: No enlarged nodes. No history of splenectomy.  PSYCHIATRIC: No history of depression or anxiety.  ENDOCRINOLOGIC: No reports of sweating, cold or heat intolerance. No polyuria or polydipsia.  Marland Kitchen   Physical Examination Vitals:   12/26/16 1618  BP: 130/80  Pulse: 72   Vitals:    12/26/16 1618  Weight: 141 lb (64 kg)  Height: 5' 9.5" (1.765 m)    Gen: resting comfortably, no acute distress HEENT: no scleral icterus, pupils equal round and reactive, no palptable cervical adenopathy,  CV: RRR, no m/r/g, no jvd Resp: Clear to auscultation bilaterally GI: abdomen is soft, non-tender, non-distended, normal bowel sounds, no hepatosplenomegaly MSK: extremities are warm, no edema.  Skin: warm, no rash Neuro:  no focal deficits Psych: appropriate affect   Diagnostic Studies  12/2016 Cardiac MRI FINDINGS: Both atria were normal in size. RV was normal in size and function. There was no ASD/VSD or pericardial effusion. The aortic root was mildly dilatated at 3.9 cm The LV was normal in size The septum was 15 mm in thickness with the posterior wall being 9 mm. There was no SAM or LVOT gradient. There was moderate appearing AR. The RV was normal in size and function. The quantitative EF was 61% (EDV 120 cc ESV 46 cc SV 74 cc) Delayed enhancement images showed poor nulling of the myocardium suggesting possibility of amyloid or infiltrative DCM. There was no scar.  Note poor quality study due to motion and ectopy Most sequences done with free breathing  IMPRESSION: 1) Moderate LVH septal thickness 15 mm compared to posterior wall 9 mm. No SAM or LVOT gradient. No delayed hyperenhancement to suggest HOCM  2) Failure to null myocardium post gadolinium suggesting possibility of amyloid  3) Tri-leaflet aortic valve with moderate appearing AR  4) Mild aortic root enlargement 3.9 cm  5) Normal RV size and function  6) No pericardial effusion  70 Normal LV size quantitative EF 61% no infarct or scar on delayed post gadolinium inversion recovery sequences  Derrick [name]  06/2017 echo Study Conclusions  - Left ventricle: Abnormal septal motion The cavity size was mildly   dilated. Wall thickness was increased in a pattern of severe LVH.   Systolic  function was mildly reduced. The estimated ejection   fraction was in the range of 45% to 50%. Diffuse hypokinesis. The   study is not technically sufficient to allow evaluation of LV   diastolic function. - Aortic valve: There was moderate regurgitation. Valve area (VTI):   3.13 cm^2. Valve area (Vmax): 2.91 cm^2. Valve area (Vmean): 2.94   cm^2. - Mitral valve: There was mild regurgitation. - Left atrium: The atrium was mildly dilated. - Right atrium: The atrium was mildly dilated. - Atrial septum: No defect or patent foramen ovale was identified. - Pulmonary arteries: PA peak pressure: 62 mm Hg (S). - Impressions: Tech entered wrong estimate of CVP   Estimated PA pressure only 62 mmHg.  Impressions:  - Tech entered wrong estimate of CVP   Estimated PA pressure only 62 mmHg.  Assessment and Plan  1. LVH/Mild LV systolic dysfunction/Chronic systolic HF - moderate asymmetric hypertrophy, MRI findings not consistent with hypertrophic CM. Pattern questionable for amyloid, SPEP/UPEP are pending. I think unlikely, EKG with typical LVH patter, I suspect he just has asymmetric hypertophy - contniue diuretics,  symptoms significantly improved.    2. Pulmonary HTN -I suspect related to COPD as well as left sided heart disease - continue diuretics at this time.  - do not see strong indication for RHC at this time.     Antoine Poche, M.D.

## 2016-12-26 NOTE — Patient Instructions (Signed)
Medication Instructions:  Your physician recommends that you continue on your current medications as directed. Please refer to the Current Medication list given to you today.   Labwork: URINE ELECTROPHORESIS   Testing/Procedures: NONE Follow-Up: Your physician recommends that you schedule a follow-up appointment in: 3 MONTHS   Any Other Special Instructions Will Be Listed Below (If Applicable).     If you need a refill on your cardiac medications before your next appointment, please call your pharmacy.

## 2017-04-04 ENCOUNTER — Ambulatory Visit: Payer: BLUE CROSS/BLUE SHIELD | Admitting: Cardiology

## 2017-05-22 ENCOUNTER — Ambulatory Visit (INDEPENDENT_AMBULATORY_CARE_PROVIDER_SITE_OTHER): Payer: BLUE CROSS/BLUE SHIELD | Admitting: Cardiology

## 2017-05-22 ENCOUNTER — Encounter: Payer: Self-pay | Admitting: Cardiology

## 2017-05-22 VITALS — BP 140/84 | HR 67 | Ht 69.5 in | Wt 140.0 lb

## 2017-05-22 DIAGNOSIS — I5022 Chronic systolic (congestive) heart failure: Secondary | ICD-10-CM

## 2017-05-22 DIAGNOSIS — I272 Pulmonary hypertension, unspecified: Secondary | ICD-10-CM

## 2017-05-22 DIAGNOSIS — I517 Cardiomegaly: Secondary | ICD-10-CM | POA: Diagnosis not present

## 2017-05-22 NOTE — Patient Instructions (Signed)
Medication Instructions:  Your physician recommends that you continue on your current medications as directed. Please refer to the Current Medication list given to you today.   Labwork: ASAP  Testing/Procedures: Your physician has requested that you have an echocardiogram. Echocardiography is a painless test that uses sound waves to create images of your heart. It provides your doctor with information about the size and shape of your heart and how well your heart's chambers and valves are working. This procedure takes approximately one hour. There are no restrictions for this procedure.    Follow-Up: Your physician recommends that you schedule a follow-up appointment in: 4 MONTHS    Any Other Special Instructions Will Be Listed Below (If Applicable).     If you need a refill on your cardiac medications before your next appointment, please call your pharmacy.

## 2017-05-22 NOTE — Progress Notes (Signed)
Clinical Summary Mr. Seaney is a 66 y.o.male seen today for follow up of the following medical problems.   1. Dyspnea - echo 06/2016 LVEF 45-50%, diffuse hypokinesis, cannot eval diastolic function. Moderate AI, mild MR. PASP 50 (not 150) - severe LVH. Septum 20 mm, posterior wall 18 mm - DOE with 1 flight of stairs. Varaible depending on his feeling of chest congestions.  - mixed compliance with Qvar - no FH of heart conditions.  - no chest pain. No palpitations.   - cardiac MRI 12/2016: moderate LV septal thickness 15 mm, posterior wall 9 mm. No SAM or LVOT gradient, gadolinium pattern not consistent with hypertrophic CM but consider amyloid. Moderate AI. Normal RV size and function. Normal atria.    - breathing is improving, but still with symptoms at times - No recent LE edema, no abdominal distension. Takes lasix prn.   2. COPD - mixed compliance with inhalers.    Past Medical History:  Diagnosis Date  . Allergic rhinitis due to pollen 09/08/2009  . COPD (chronic obstructive pulmonary disease) (HCC) 03/14/2013  . 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 Family history of diabetes mellitus 09/08/2009   father  . Family history of stroke (cerebrovascular) 09/08/2009   father  . Nicotine dependence, chewing tobacco, uncomplicated 08/07/1968  . Other disturbances of skin sensation 11/08/2012  . Perennial allergic rhinitis with seasonal variation 11/08/2012  . RA (rheumatoid arthritis) (HCC) 09/08/2009     No Known Allergies   Current Outpatient Prescriptions  Medication Sig Dispense Refill  . albuterol (PROVENTIL) (2.5 MG/3ML) 0.083% nebulizer solution INHALATION USE ONE AMPULE IN YOUR NEBULIZER UP TO 4 TIMES A DAY FOR BREATHING.  11  . carvedilol (COREG) 3.125 MG tablet Take 1 tablet (3.125 mg total) by mouth 2 (two) times daily. 180 tablet 2  . CVS VITAMIN B12 1000 MCG tablet TAKE 2 TABLETS EACH DAY FOR VITAMIN B12 DEFICIENCY.  11  .  fluticasone (FLONASE) 50 MCG/ACT nasal spray USE 2 SPRAYS IN EACH NOSTRIL ONCE A DAY TO PREVENT NASAL ALLERGIES.  11  . furosemide (LASIX) 40 MG tablet TAKE ONE TABLET BY MOUTH DAILY IF NEEDED FOR FLUID. 30 tablet 3  . KLOR-CON M20 20 MEQ tablet TAKE ONE TABLET BY MOUTH DAILY TO REPLACE POTASSIUM. 30 tablet 3  . lisinopril (PRINIVIL,ZESTRIL) 2.5 MG tablet Take 2.5 mg by mouth daily.    . Pseudoephedrine-APAP-DM (DAYQUIL PO) Take 1 capsule by mouth daily.    Marland Kitchen QVAR 80 MCG/ACT inhaler INHALE 2 PUFFS ONCE A DAY TO PREVENT BREATHING PROBLEMS.  11   No current facility-administered medications for this visit.      Past Surgical History:  Procedure Laterality Date  . DENTAL SURGERY       No Known Allergies    Family History  Problem Relation Age of Onset  . Hypertension Mother   . CVA Father   . Heart attack Brother      Social History Mr. Woodhull reports that he has never smoked. He has never used smokeless tobacco. Mr. Laducer reports that he does not drink alcohol.   Review of Systems CONSTITUTIONAL: No weight loss, fever, chills, weakness or fatigue.  HEENT: Eyes: No visual loss, blurred vision, double vision or yellow sclerae.No hearing loss, sneezing, congestion, runny nose or sore throat.  SKIN: No rash or itching.  CARDIOVASCULAR: per hpi RESPIRATORY: per hpi GASTROINTESTINAL: No anorexia, nausea, vomiting or diarrhea. No abdominal pain or blood.  GENITOURINARY: No burning  on urination, no polyuria NEUROLOGICAL: No headache, dizziness, syncope, paralysis, ataxia, numbness or tingling in the extremities. No change in bowel or bladder control.  MUSCULOSKELETAL: No muscle, back pain, joint pain or stiffness.  LYMPHATICS: No enlarged nodes. No history of splenectomy.  PSYCHIATRIC: No history of depression or anxiety.  ENDOCRINOLOGIC: No reports of sweating, cold or heat intolerance. No polyuria or polydipsia.  Marland Kitchen   Physical Examination Vitals:   05/22/17 1619  BP: 140/84    Pulse: 67  SpO2: 97%   Vitals:   05/22/17 1619  Weight: 140 lb (63.5 kg)  Height: 5' 9.5" (1.765 m)    Gen: resting comfortably, no acute distress HEENT: no scleral icterus, pupils equal round and reactive, no palptable cervical adenopathy,  CV: RRR, no m/r/g, no jvd Resp: Clear to auscultation bilaterally GI: abdomen is soft, non-tender, non-distended, normal bowel sounds, no hepatosplenomegaly MSK: extremities are warm, no edema.  Skin: warm, no rash Neuro:  no focal deficits Psych: appropriate affect   Diagnostic Studies 12/2016 Cardiac MRI FINDINGS: Both atria were normal in size. RV was normal in size and function. There was no ASD/VSD or pericardial effusion. The aortic root was mildly dilatated at 3.9 cm The LV was normal in size The septum was 15 mm in thickness with the posterior wall being 9 mm. There was no SAM or LVOT gradient. There was moderate appearing AR. The RV was normal in size and function. The quantitative EF was 61% (EDV 120 cc ESV 46 cc SV 74 cc) Delayed enhancement images showed poor nulling of the myocardium suggesting possibility of amyloid or infiltrative DCM. There was no scar.  Note poor quality study due to motion and ectopy Most sequences done with free breathing  IMPRESSION: 1) Moderate LVH septal thickness 15 mm compared to posterior wall 9 mm. No SAM or LVOT gradient. 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 Wall thickness was increased in  a pattern of severe LVH. Systolic function was mildly reduced. The estimated ejection fraction was in the range of 45% to 50%. Diffuse hypokinesis. The study is not technically sufficient to allow evaluation of LV diastolic function. - Aortic valve: There was moderate regurgitation. Valve area (VTI): 3.13 cm^2. Valve area (Vmax): 2.91 cm^2. Valve area (Vmean): 2.94 cm^2. - Mitral valve: There was mild regurgitation. - Left atrium: The atrium was mildly dilated. - Right atrium: The atrium was mildly dilated. - Atrial septum: No defect or patent foramen ovale was identified. - Pulmonary arteries: PA peak pressure: 62 mm Hg (S). - Impressions: Tech entered wrong estimate of CVP Estimated PA pressure only 62 mmHg.  Impressions:  - Tech entered wrong estimate of CVP Estimated PA pressure only 62 mmHg.    Assessment and Plan   1. LVH/Mild LV systolic dysfunction/Chronic systolic HF - moderate asymmetric hypertrophy, MRI findings not consistent with hypertrophic CM. Pattern questionable for amyloid, SPEP normal with upep pending - symptoms improving, continue to monitor at this time. Continue current meds.  - repeat echo to reevaluate LVEF, if remains low  may need further medication titration.   2. Pulmonary HTN -I suspect related to COPD as well as left sided heart disease - repeat echo now that he is more euvolemic to reevaluate pulmonary pressures   F/u 6 months  Antoine Poche, M.D

## 2017-06-01 ENCOUNTER — Ambulatory Visit (HOSPITAL_COMMUNITY)
Admission: RE | Admit: 2017-06-01 | Discharge: 2017-06-01 | Disposition: A | Discharge status: Home / Self Care | Payer: BLUE CROSS/BLUE SHIELD | Source: Ambulatory Visit | Attending: Cardiology | Admitting: Cardiology

## 2017-06-01 ENCOUNTER — Other Ambulatory Visit: Payer: Self-pay | Admitting: Cardiology

## 2017-06-01 DIAGNOSIS — I272 Pulmonary hypertension, unspecified: Secondary | ICD-10-CM | POA: Diagnosis not present

## 2017-06-01 DIAGNOSIS — I083 Combined rheumatic disorders of mitral, aortic and tricuspid valves: Secondary | ICD-10-CM | POA: Diagnosis not present

## 2017-06-01 DIAGNOSIS — R0600 Dyspnea, unspecified: Secondary | ICD-10-CM | POA: Insufficient documentation

## 2017-06-01 DIAGNOSIS — J449 Chronic obstructive pulmonary disease, unspecified: Secondary | ICD-10-CM | POA: Diagnosis not present

## 2017-06-01 DIAGNOSIS — I5022 Chronic systolic (congestive) heart failure: Secondary | ICD-10-CM

## 2017-06-01 DIAGNOSIS — M069 Rheumatoid arthritis, unspecified: Secondary | ICD-10-CM | POA: Diagnosis not present

## 2017-06-01 NOTE — Progress Notes (Signed)
*  PRELIMINARY RESULTS* Echocardiogram 2D Echocardiogram has been performed.  Derrick [name] 06/01/2017, 3:29 PM

## 2017-06-04 LAB — PROTEIN ELECTROPHORESIS, URINE REFLEX
Albumin ELP, Urine: 32.7 %
Alpha-1-Globulin, U: 7.9 %
Alpha-2-Globulin, U: 9.4 %
BETA GLOBULIN, U: 37.9 %
GAMMA GLOBULIN, U: 12.2 %
Protein, Ur: 19 mg/dL

## 2017-06-06 ENCOUNTER — Telehealth: Payer: Self-pay

## 2017-06-06 NOTE — Telephone Encounter (Signed)
-----   Message from Jonathan F Branch, MD sent at 06/06/2017  1:13 PM EDT ----- Echo shows some pressures in his heart remain quite high. Id like to discuss some possible additional testing with him. Is he able to see me Fri Nov 9 at 940?   J BrancH MD 

## 2017-06-06 NOTE — Telephone Encounter (Signed)
Called pt. His mobile phone number is no longer in service, I left a message at his home number for him to return call.

## 2017-06-10 ENCOUNTER — Other Ambulatory Visit: Payer: Self-pay | Admitting: Cardiology

## 2017-06-10 ENCOUNTER — Other Ambulatory Visit: Payer: Self-pay | Admitting: Cardiovascular Disease

## 2017-06-12 ENCOUNTER — Telehealth: Payer: Self-pay

## 2017-06-12 NOTE — Telephone Encounter (Signed)
-----   Message from Antoine Poche, MD sent at 06/06/2017  1:13 PM EDT ----- Echo shows some pressures in his heart remain quite high. Id like to discuss some possible additional testing with him. Is he able to see me Fri Nov 9 at 940?   J BrancH MD

## 2017-06-12 NOTE — Telephone Encounter (Signed)
Called pt. No answer on mobile, left message for pt. to return call. Home phone is not in service at this time.

## 2017-06-13 ENCOUNTER — Telehealth: Payer: Self-pay

## 2017-06-13 NOTE — Telephone Encounter (Signed)
Pt finally came in office. He is scheduled for Tuesday November 13 @ 9:40 to see Dr. Wyline Mood.

## 2017-06-25 ENCOUNTER — Other Ambulatory Visit: Payer: Self-pay

## 2017-06-25 ENCOUNTER — Encounter: Payer: Self-pay | Admitting: Cardiology

## 2017-06-25 ENCOUNTER — Ambulatory Visit: Payer: BLUE CROSS/BLUE SHIELD | Admitting: Cardiology

## 2017-06-25 ENCOUNTER — Emergency Department (HOSPITAL_COMMUNITY): Payer: BLUE CROSS/BLUE SHIELD

## 2017-06-25 ENCOUNTER — Encounter (HOSPITAL_COMMUNITY): Payer: Self-pay | Admitting: Emergency Medicine

## 2017-06-25 ENCOUNTER — Emergency Department (HOSPITAL_COMMUNITY)
Admission: EM | Admit: 2017-06-25 | Discharge: 2017-06-25 | Disposition: A | Discharge status: Home / Self Care | Payer: BLUE CROSS/BLUE SHIELD | Attending: Emergency Medicine | Admitting: Emergency Medicine

## 2017-06-25 VITALS — BP 146/90 | HR 83 | Ht 69.5 in | Wt 140.0 lb

## 2017-06-25 DIAGNOSIS — I5022 Chronic systolic (congestive) heart failure: Secondary | ICD-10-CM

## 2017-06-25 DIAGNOSIS — R0902 Hypoxemia: Secondary | ICD-10-CM

## 2017-06-25 DIAGNOSIS — J449 Chronic obstructive pulmonary disease, unspecified: Secondary | ICD-10-CM

## 2017-06-25 DIAGNOSIS — I272 Pulmonary hypertension, unspecified: Secondary | ICD-10-CM | POA: Diagnosis not present

## 2017-06-25 DIAGNOSIS — I517 Cardiomegaly: Secondary | ICD-10-CM

## 2017-06-25 DIAGNOSIS — I509 Heart failure, unspecified: Secondary | ICD-10-CM | POA: Insufficient documentation

## 2017-06-25 DIAGNOSIS — R0602 Shortness of breath: Secondary | ICD-10-CM | POA: Diagnosis present

## 2017-06-25 DIAGNOSIS — J189 Pneumonia, unspecified organism: Secondary | ICD-10-CM | POA: Diagnosis not present

## 2017-06-25 DIAGNOSIS — Z87891 Personal history of nicotine dependence: Secondary | ICD-10-CM | POA: Insufficient documentation

## 2017-06-25 DIAGNOSIS — J9621 Acute and chronic respiratory failure with hypoxia: Secondary | ICD-10-CM

## 2017-06-25 DIAGNOSIS — J441 Chronic obstructive pulmonary disease with (acute) exacerbation: Secondary | ICD-10-CM | POA: Diagnosis not present

## 2017-06-25 LAB — CBC WITH DIFFERENTIAL/PLATELET
BASOS ABS: 0 10*3/uL (ref 0.0–0.1)
BASOS PCT: 1 %
EOS ABS: 0.1 10*3/uL (ref 0.0–0.7)
EOS PCT: 1 %
HCT: 55 % — ABNORMAL HIGH (ref 39.0–52.0)
Hemoglobin: 17.3 g/dL — ABNORMAL HIGH (ref 13.0–17.0)
Lymphocytes Relative: 19 %
Lymphs Abs: 0.7 10*3/uL (ref 0.7–4.0)
MCH: 33.4 pg (ref 26.0–34.0)
MCHC: 31.5 g/dL (ref 30.0–36.0)
MCV: 106.2 fL — ABNORMAL HIGH (ref 78.0–100.0)
MONO ABS: 0.5 10*3/uL (ref 0.1–1.0)
MONOS PCT: 12 %
Neutro Abs: 2.6 10*3/uL (ref 1.7–7.7)
Neutrophils Relative %: 68 %
PLATELETS: 153 10*3/uL (ref 150–400)
RBC: 5.18 MIL/uL (ref 4.22–5.81)
RDW: 13.3 % (ref 11.5–15.5)
WBC: 3.8 10*3/uL — ABNORMAL LOW (ref 4.0–10.5)

## 2017-06-25 LAB — BASIC METABOLIC PANEL
Anion gap: 4 — ABNORMAL LOW (ref 5–15)
BUN: 12 mg/dL (ref 6–20)
CALCIUM: 8.8 mg/dL — AB (ref 8.9–10.3)
CO2: 38 mmol/L — AB (ref 22–32)
CREATININE: 1.07 mg/dL (ref 0.61–1.24)
Chloride: 97 mmol/L — ABNORMAL LOW (ref 101–111)
Glucose, Bld: 81 mg/dL (ref 65–99)
Potassium: 4.1 mmol/L (ref 3.5–5.1)
Sodium: 139 mmol/L (ref 135–145)

## 2017-06-25 LAB — BRAIN NATRIURETIC PEPTIDE: B NATRIURETIC PEPTIDE 5: 799 pg/mL — AB (ref 0.0–100.0)

## 2017-06-25 LAB — TROPONIN I: Troponin I: 0.06 ng/mL (ref <0.03)

## 2017-06-25 LAB — D-DIMER, QUANTITATIVE: D-Dimer, Quant: 0.54 ug/mL-FEU — ABNORMAL HIGH (ref 0.00–0.50)

## 2017-06-25 MED ORDER — PREDNISONE 50 MG PO TABS
60.0000 mg | ORAL_TABLET | Freq: Once | ORAL | Status: AC
  Administered 2017-06-25: 60 mg via ORAL
  Filled 2017-06-25: qty 1

## 2017-06-25 MED ORDER — PREDNISONE 20 MG PO TABS: ORAL_TABLET | ORAL | 0 refills | Status: DC

## 2017-06-25 MED ORDER — ALBUTEROL (5 MG/ML) CONTINUOUS INHALATION SOLN
15.0000 mg/h | INHALATION_SOLUTION | RESPIRATORY_TRACT | Status: DC
  Administered 2017-06-25: 15 mg/h via RESPIRATORY_TRACT
  Filled 2017-06-25: qty 20

## 2017-06-25 MED ORDER — DOXYCYCLINE HYCLATE 100 MG PO TABS
100.0000 mg | ORAL_TABLET | Freq: Once | ORAL | Status: AC
  Administered 2017-06-25: 100 mg via ORAL
  Filled 2017-06-25: qty 1

## 2017-06-25 MED ORDER — DOXYCYCLINE HYCLATE 100 MG PO CAPS: 100.0000 mg | ORAL_CAPSULE | Freq: Two times a day (BID) | ORAL | 0 refills | Status: DC

## 2017-06-25 MED ORDER — FUROSEMIDE 10 MG/ML IJ SOLN
40.0000 mg | Freq: Once | INTRAMUSCULAR | Status: AC
  Administered 2017-06-25: 40 mg via INTRAVENOUS
  Filled 2017-06-25: qty 4

## 2017-06-25 MED ORDER — IPRATROPIUM BROMIDE 0.02 % IN SOLN
1.0000 mg | Freq: Once | RESPIRATORY_TRACT | Status: AC
  Administered 2017-06-25: 1 mg via RESPIRATORY_TRACT
  Filled 2017-06-25: qty 5

## 2017-06-25 NOTE — Progress Notes (Signed)
Talked with patient in the ER after discussing with Dr Clayborne Dana, patient wishes to leave the ER. I told the patient specifically that it appears he has a severe pneumonia that is causing his oxygen levels to be very low, and I speficically told him if he leaves the hospital he is putting himself in significant danger, including the risk of death. He acknowledges this risk, he is fully alert and oriented and fully competent. He refuses to stay in the hospital despite our discussion. I would give patient course of oral antibiotic and oral prednisone for suspected COPD exacerbation and pneumonia.    Dina Rich MD

## 2017-06-25 NOTE — ED Triage Notes (Signed)
Pt brought over from [place]. Per pt, pt was seen today to review results of recent tests. Per [place], pt o2 saturation 76% on room air. Pt denies pain. Pt alert and oriented. No dyspnea noted. Pt o2 saturation on room air at time of arrival to ED is 86%. Pt placed on 2liters. While obtaining EKG, pt saturation decreased to 76%. Pt now on 3 liters.

## 2017-06-25 NOTE — Discharge Instructions (Signed)
Please come back immediately if you change your mind about admission and further management. If not, please see one of your doctors tomorrow for recheck.  Please double your lasix (take 40 mg twice a day) for the next three days.

## 2017-06-25 NOTE — ED Notes (Signed)
Date and time results received: 06/25/17  (use smartphrase ".now" to insert current time)  Test: Troponin Critical Value: 0.06  Name of Provider Notified: Dr. Deretha Emory  Orders Received? Or Actions Taken?: No new orders at this time

## 2017-06-25 NOTE — ED Provider Notes (Signed)
Emergency Department Provider Note   I have reviewed the triage vital signs and the nursing notes.   HISTORY  Chief Complaint Shortness of Breath   HPI Derrick [name] is a 66 y.o. male with a history of COPD and CHF who presents the emergency department from cardiology clinic secondary to hypoxia.  Patient states he feels like he is in his normal state of health.  He says he does get dyspneic when going up the stairs but has been going on for quite some time he also states he does have a clear cough in the morning.  States his shortness of breath is no different than normal.  No fevers, chest pain, back pain.  Intermittently has some lower extremity swelling but that resolves on its own and he is on a diuretic of some sort.  That he went for a scheduled appointment with his cardiologist today where he was found to have sats in the mid 70s so was sent here for further evaluation.  Here he adamantly denies any change in his health status and really wants to go home.   Past Medical History:  Diagnosis Date  . Allergic rhinitis due to pollen 09/08/2009  . COPD (chronic obstructive pulmonary disease) (HCC) 03/14/2013  . 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 Family history of diabetes mellitus 09/08/2009   father  . Family history of stroke (cerebrovascular) 09/08/2009   father  . Nicotine dependence, chewing tobacco, uncomplicated 08/07/1968  . Other disturbances of skin sensation 11/08/2012  . Perennial allergic rhinitis with seasonal variation 11/08/2012  . RA (rheumatoid arthritis) (HCC) 09/08/2009    There are no active problems to display for this patient.   Past Surgical History:  Procedure Laterality Date  . DENTAL SURGERY      Current Outpatient Rx  . Order #: [id number] Class: Historical Med  . Order #: [id number] Class: Normal  . Order #: [id number] Class: Historical Med  . Order #: [id number] Class: Historical Med  . Order #: [id number] Class:  Normal  . Order #: [id number] Class: Normal  . Order #: [id number] Class: Normal  . Order #: [id number] Class: Historical Med  . Order #: [id number] Class: Print  . Order #: [id number] Class: Print    Allergies Patient has no known allergies.  Family History  Problem Relation Age of Onset  . Hypertension Mother   . CVA Father   . Heart attack Brother     Social History Social History   Tobacco Use  . Smoking status: Former Smoker    Types: Cigarettes  . Smokeless tobacco: Current User    Types: Chew  . Tobacco comment: has chewed tobacco for 40+ years  Substance Use Topics  . Alcohol use: No  . Drug use: No    Review of Systems  All other systems negative except as documented in the HPI. All pertinent positives and negatives as reviewed in the HPI. ____________________________________________   PHYSICAL EXAM:  VITAL SIGNS: ED Triage Vitals [06/25/17 1025]  Enc Vitals Group     BP (!) 164/93     Pulse Rate (!) 57     Resp 20     Temp 97.6 F (36.4 C)     Temp Source Oral     SpO2 96 %     Weight      Height      Head Circumference      Peak Flow      Pain Score      Pain Loc  Pain Edu?      Excl. in GC?     Constitutional: Alert and oriented. Well appearing and in no acute distress. Eyes: Conjunctivae are normal. PERRL. EOMI. Head: Atraumatic. Nose: No congestion/rhinnorhea. Mouth/Throat: Mucous membranes are moist.  Oropharynx non-erythematous. Neck: No stridor.  No meningeal signs.   Cardiovascular: Normal rate, regular rhythm. Good peripheral circulation. Grossly normal heart sounds.   Respiratory: tachypneic respiratory effort with hypoxia on room air.  No retractions. Lungs significantly diminished with wheezing. Gastrointestinal: Soft and nontender. No distention.  Musculoskeletal: No lower extremity tenderness nor edema. No gross deformities of extremities. Neurologic:  Normal speech and language. No gross focal neurologic deficits are appreciated.   Skin:  Skin is warm, dry and intact. No rash noted.   ____________________________________________   LABS (all labs ordered are listed, but only abnormal results are displayed)  Labs Reviewed  CBC WITH DIFFERENTIAL/PLATELET - Abnormal; Notable for the following components:      Result Value   WBC 3.8 (*)    Hemoglobin 17.3 (*)    HCT 55.0 (*)    MCV 106.2 (*)    All other components within normal limits  BASIC METABOLIC PANEL - Abnormal; Notable for the following components:   Chloride 97 (*)    CO2 38 (*)    Calcium 8.8 (*)    Anion gap 4 (*)    All other components within normal limits  TROPONIN I - Abnormal; Notable for the following components:   Troponin I 0.06 (*)    All other components within normal limits  BRAIN NATRIURETIC PEPTIDE - Abnormal; Notable for the following components:   B Natriuretic Peptide 799.0 (*)    All other components within normal limits  D-DIMER, QUANTITATIVE (NOT AT [facility]) - Abnormal; Notable for the following components:   D-Dimer, Quant 0.54 (*)    All other components within normal limits   ____________________________________________  EKG   EKG Interpretation  Date/Time:  Monday June 25 2017 10:24:07 EST Ventricular Rate:  59 PR Interval:  154 QRS Duration: 86 QT Interval:  428 QTC Calculation: 423 R Axis:   58 Text Interpretation:  Sinus bradycardia with marked sinus arrhythmia Anterior infarct , age undetermined T wave abnormality, consider inferior ischemia Abnormal ECG No previous ECGs available Confirmed by Vanetta Mulders [phone]) on 06/25/2017 11:23:11 AM       ____________________________________________  RADIOLOGY  Dg Chest 2 View  Result Date: 06/25/2017 CLINICAL DATA:  Cough, chest congestion, and shortness of breath. Low O2 saturation. COPD. EXAM: CHEST  2 VIEW COMPARISON:  05/30/2016 and 12/03/2012 FINDINGS: There is chronic cardiomegaly with aortic atherosclerosis. There is slight haziness in the left  perihilar region which could represent early infiltrate. Emphysematous changes bilaterally. No effusions. No acute bone abnormality. IMPRESSION: Emphysema with a faint area of haziness in the left perihilar region which may represent an early infiltrate. Electronically Signed   By: Francene Boyers M.D.   On: 06/25/2017 11:33    ____________________________________________   PROCEDURES  Procedure(s) performed:   Procedures  CRITICAL CARE Performed by: Marily Memos Total critical care time: 45 minutes Critical care time was exclusive of separately billable procedures and treating other patients. Critical care was necessary to treat or prevent imminent or life-threatening deterioration. Critical care was time spent personally by me on the following activities: development of treatment plan with patient and/or surrogate as well as nursing, discussions with consultants, evaluation of patient's response to treatment, examination of patient, obtaining history from patient or surrogate, ordering  and performing treatments and interventions, ordering and review of laboratory studies, ordering and review of radiographic studies, pulse oximetry and re-evaluation of patient's condition.  ____________________________________________   INITIAL IMPRESSION / ASSESSMENT AND PLAN / ED COURSE  Pertinent labs & imaging results that were available during my care of the patient were reviewed by me and considered in my medical decision making (see chart for details).   Here with concern for possible COPD exacerbation versus CHF versus pneumonia.  He is hypoxic with T wave inversions and a positive troponin and his EKG.  I discussed the need to stay in the hospital however he refuses.  He is alert and oriented and understands that this is going to get worse before he gets better.  He feels like he is at his baseline and he wants to go home.  I did compromise with him and he is going to stay for some breathing  treatments and repeat testing and I will attempt to convince him to stay at that time.  Multiple re-evaluations the patient still adamantly refuses admission.  Advised him of his high risk of death or worsening patient understands and comprehends that risk and still wants to leave.  The nurse also reiterated these risks and the patient still wants to leave.  I discussed with Dr. Wyline Mood to see if he can get him to be agreeable to admission however patient wants to leave AGAINST MEDICAL ADVICE.  He is conscious, alert and oriented understands risks and benefits of admission versus discharge and is competent to make this decision.  Patient will be started on antibiotics and asked to increase his Lasix and continue taken breathing treatments and steroids and to come back immediately if he changes his mind about admission.  Otherwise I recommend he follows up with his doctor as soon as possible, tomorrow if possible, for reevaluation and reconsideration of admission to the hospital.  ____________________________________________  FINAL CLINICAL IMPRESSION(S) / ED DIAGNOSES  Final diagnoses:  Acute on chronic respiratory failure with hypoxia (HCC)  Community acquired pneumonia of left lung, unspecified part of lung  COPD exacerbation (HCC)     MEDICATIONS GIVEN DURING THIS VISIT:  Medications  albuterol (PROVENTIL,VENTOLIN) solution continuous neb (15 mg/hr Nebulization New Bag/Given 06/25/17 1259)  predniSONE (DELTASONE) tablet 60 mg (60 mg Oral Given 06/25/17 1236)  ipratropium (ATROVENT) nebulizer solution 1 mg (1 mg Nebulization Given 06/25/17 1259)  doxycycline (VIBRA-TABS) tablet 100 mg (100 mg Oral Given 06/25/17 1236)  furosemide (LASIX) injection 40 mg (40 mg Intravenous Given 06/25/17 1410)     NEW OUTPATIENT MEDICATIONS STARTED DURING THIS VISIT:  This SmartLink is deprecated. Use AVSMEDLIST instead to display the medication list for a patient.  Note:  This document was prepared  using Dragon voice recognition software and may include unintentional dictation errors.   Marily Memos, MD 06/25/17 [phone]

## 2017-06-25 NOTE — ED Notes (Signed)
Patient stated he was ready to leave. Pt advised that MD planned to admit pt for further treatment.  Pt stated that he was not staying and wanted to leave.  Risks of leaving AMA was discussed with patient and pt verbalized understanding of risks.  Pt left AMA.

## 2017-06-25 NOTE — Progress Notes (Signed)
Clinical Summary Derrick [name] is a 66 y.o.male  1. Dyspnea - echo 06/2016 LVEF 45-50%, diffuse hypokinesis, cannot eval diastolic function. Moderate AI, mild MR. PASP 50 (not 150) - severe LVH. Septum 20 mm, posterior wall 18 mm - DOE with 1 flight of stairs. Varaible depending on his feeling of chest congestions.  - mixed compliance with Qvar - no FH of heart conditions.  - no chest pain. No palpitations.   - cardiac MRI 12/2016: moderate LV septal thickness 15 mm, posterior wall 9 mm. No SAM or LVOT gradient, gadolinium pattern not consistent with hypertrophic CM but consider amyloid. Moderate AI. Normal RV size and function. Normal atria.  - UPEP/SPEP overall unremarkable.   - no recent edema, no weight gain. No orthopnea or PND.  - compliant with meds   - breathing is improving, but still with symptoms at times - No recent LE edema, no abdominal distension. Takes lasix prn.   2. COPD - mixed compliance with inhalers.  - some increased SOB and cough this AM  3. Pulmonary hypertension - repeat echo 05/2017 with PASP 88. Could not evaluate diastolic function, normal LA size would argue against significant dysfunction - he does have history of COPD, rheumatoid arthritis  as well. +ANA previously  Past Medical History:  Diagnosis Date  . Allergic rhinitis due to pollen 09/08/2009  . COPD (chronic obstructive pulmonary disease) (HCC) 03/14/2013  . 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 Family history of diabetes mellitus 09/08/2009   father  . Family history of stroke (cerebrovascular) 09/08/2009   father  . Nicotine dependence, chewing tobacco, uncomplicated 08/07/1968  . Other disturbances of skin sensation 11/08/2012  . Perennial allergic rhinitis with seasonal variation 11/08/2012  . RA (rheumatoid arthritis) (HCC) 09/08/2009     No Known Allergies   Current Outpatient Medications  Medication Sig Dispense Refill  . albuterol  (PROVENTIL) (2.5 MG/3ML) 0.083% nebulizer solution INHALATION USE ONE AMPULE IN YOUR NEBULIZER UP TO 4 TIMES A DAY FOR BREATHING.  11  . carvedilol (COREG) 3.125 MG tablet Take 1 tablet (3.125 mg total) by mouth 2 (two) times daily. 180 tablet 2  . [employer] VITAMIN B12 1000 MCG tablet TAKE 2 TABLETS EACH DAY FOR VITAMIN B12 DEFICIENCY.  11  . fluticasone (FLONASE) 50 MCG/ACT nasal spray USE 2 SPRAYS IN EACH NOSTRIL ONCE A DAY TO PREVENT NASAL ALLERGIES.  11  . furosemide (LASIX) 40 MG tablet TAKE ONE TABLET BY MOUTH DAILY IF NEEDED FOR FLUID. 30 tablet 3  . KLOR-CON M20 20 MEQ tablet TAKE 1 TABLET BY MOUTH EVERY DAY 90 tablet 3  . lisinopril (PRINIVIL,ZESTRIL) 2.5 MG tablet Take 2.5 mg by mouth daily.    Marland Kitchen lisinopril (PRINIVIL,ZESTRIL) 2.5 MG tablet TAKE 1 TABLET (2.5 MG TOTAL) BY MOUTH DAILY. 90 tablet 1  . Pseudoephedrine-APAP-DM (DAYQUIL PO) Take 1 capsule by mouth daily.    Marland Kitchen QVAR 80 MCG/ACT inhaler INHALE 2 PUFFS ONCE A DAY TO PREVENT BREATHING PROBLEMS.  11   No current facility-administered medications for this visit.      Past Surgical History:  Procedure Laterality Date  . DENTAL SURGERY       No Known Allergies    Family History  Problem Relation Age of Onset  . Hypertension Mother   . CVA Father   . Heart attack Brother      Social History Mr. Kilcullen reports that  has never smoked. he has never used smokeless tobacco. Mr.  Wah reports that he does not drink alcohol.   Review of Systems CONSTITUTIONAL: No weight loss, fever, chills, weakness or fatigue.  HEENT: Eyes: No visual loss, blurred vision, double vision or yellow sclerae.No hearing loss, sneezing, congestion, runny nose or sore throat.  SKIN: No rash or itching.  CARDIOVASCULAR: per HPI RESPIRATORY: + SOB GASTROINTESTINAL: No anorexia, nausea, vomiting or diarrhea. No abdominal pain or blood.  GENITOURINARY: No burning on urination, no polyuria NEUROLOGICAL: No headache, dizziness, syncope, paralysis, ataxia,  numbness or tingling in the extremities. No change in bowel or bladder control.  MUSCULOSKELETAL: No muscle, back pain, joint pain or stiffness.  LYMPHATICS: No enlarged nodes. No history of splenectomy.  PSYCHIATRIC: No history of depression or anxiety.  ENDOCRINOLOGIC: No reports of sweating, cold or heat intolerance. No polyuria or polydipsia.  Marland Kitchen   Physical Examination Vitals:   06/25/17 0930  BP: (!) 146/90  Pulse: 83  SpO2: (!) 74%   Vitals:   06/25/17 0930  Weight: 140 lb (63.5 kg)  Height: 5' 9.5" (1.765 m)    Gen: resting comfortably, no acute distress HEENT: no scleral icterus, pupils equal round and reactive, no palptable cervical adenopathy,  CV: RRR, no m/r/g, no jvd Resp: Clear to auscultation bilaterally GI: abdomen is soft, non-tender, non-distended, normal bowel sounds, no hepatosplenomegaly MSK: extremities are warm, no edema.  Skin: warm, no rash Neuro:  no focal deficits Psych: appropriate affect   Diagnostic Studies 12/2016 Cardiac MRI FINDINGS: Both atria were normal in size. RV was normal in size and function. There was no ASD/VSD or pericardial effusion. The aortic root was mildly dilatated at 3.9 cm The LV was normal in size The septum was 15 mm in thickness with the posterior wall being 9 mm. There was no SAM or LVOT gradient. There was moderate appearing AR. The RV was normal in size and function. The quantitative EF was 61% (EDV 120 cc ESV 46 cc SV 74 cc) Delayed enhancement images showed poor nulling of the myocardium suggesting possibility of amyloid or infiltrative DCM. There was no scar.  Note poor quality study due to motion and ectopy Most sequences done with free breathing  IMPRESSION: 1) Moderate LVH septal thickness 15 mm compared to posterior wall 9 mm. No SAM or LVOT gradient. No delayed hyperenhancement to suggest HOCM  2) Failure to null myocardium post gadolinium suggesting possibility of amyloid  3) Tri-leaflet  aortic valve with moderate appearing AR  4) Mild aortic root enlargement 3.9 cm  5) Normal RV size and function  6) No pericardial effusion  70 Normal LV size quantitative EF 61% no infarct or scar on delayed post gadolinium inversion recovery sequences  [name]  06/2017 echo Study Conclusions  - Left ventricle: Abnormal septal motion The cavity size was mildly dilated. Wall thickness was increased in a pattern of severe LVH. Systolic function was mildly reduced. The estimated ejection fraction was in the range of 45% to 50%. Diffuse hypokinesis. The study is not technically sufficient to allow evaluation of LV diastolic function. - Aortic valve: There was moderate regurgitation. Valve area (VTI): 3.13 cm^2. Valve area (Vmax): 2.91 cm^2. Valve area (Vmean): 2.94 cm^2. - Mitral valve: There was mild regurgitation. - Left atrium: The atrium was mildly dilated. - Right atrium: The atrium was mildly dilated. - Atrial septum: No defect or patent foramen ovale was identified. - Pulmonary arteries: PA peak pressure: 62 mm Hg (S). - Impressions: Tech entered wrong estimate of CVP Estimated PA pressure only 62  mmHg.  Impressions:  - Tech entered wrong estimate of CVP Estimated PA pressure only 62 mmHg.    05/2017 echo Study Conclusions  - Left ventricle: The cavity size was normal. Wall thickness was   increased in a pattern of severe LVH. Systolic function was   normal. The estimated ejection fraction was in the range of 60%   to 65%. Wall motion was normal; there were no regional wall   motion abnormalities. Diastolic dysfunction, grade indeterminate.   Indeterminate filling pressures. - Aortic valve: Trileaflet; normal thickness leaflets. There was   moderate regurgitation. - Aorta: Mild aortic root dilatation. Aortic root dimension: 42 mm   (ED). - Mitral valve: There was mild regurgitation. - Right ventricle: The cavity size was  moderately dilated. Systolic   function was moderately reduced. - Right atrium: The atrium was moderately to severely dilated. - Atrial septum: No defect or patent foramen ovale was identified. - Tricuspid valve: There was mild-moderate regurgitation. - Pulmonary arteries: Systolic pressure was severely increased. PA   peak pressure: 88 mm Hg (S).   Assessment and Plan   1. LVH/Mild LV systolic dysfunction/Chronic systolic HF - moderate asymmetric hypertrophy, MRI findings not consistent with hypertrophic CM. Pattern questionable for amyloid, SPEP and UPEP overall unremarkable.  - by repeat echo his heart function has normalized.  - appears euvoelmic, continue current meds  2. Pulmonary HTN -severe by echo. History of COPD and rheumatoid arthtitis - I have strongly recommended RHC for patient, he is not in favor at this time  3. COPD - significant hypoxia in clinic today. Has some SOB and cough this AM - due to degree of hypoxia checked on multiple oximeters, we will refer to ER for evaluation.      Antoine Poche, M.D

## 2017-06-25 NOTE — Patient Instructions (Signed)
Medication Instructions:  Your physician recommends that you continue on your current medications as directed. Please refer to the Current Medication list given to you today.   Labwork: NONE  Testing/Procedures: NONE  Follow-Up: Your physician recommends that you schedule a follow-up appointment in: 4 MONTHS ( MARCH)    Any Other Special Instructions Will Be Listed Below (If Applicable).  DR. Wyline Mood ADVISES THAT YOU GO TO THE ED FOR EVALUATION FOR SOB   If you need a refill on your cardiac medications before your next appointment, please call your pharmacy.

## 2017-06-25 NOTE — ED Notes (Signed)
Pt taken to xray 

## 2017-07-06 ENCOUNTER — Observation Stay (HOSPITAL_COMMUNITY)
Admission: EM | Admit: 2017-07-06 | Discharge: 2017-07-08 | DRG: 190 | Disposition: A | Discharge status: Home / Self Care | Payer: BLUE CROSS/BLUE SHIELD | Attending: Internal Medicine | Admitting: Internal Medicine

## 2017-07-06 ENCOUNTER — Encounter (HOSPITAL_COMMUNITY): Payer: Self-pay | Admitting: Emergency Medicine

## 2017-07-06 ENCOUNTER — Emergency Department (HOSPITAL_COMMUNITY): Payer: BLUE CROSS/BLUE SHIELD

## 2017-07-06 ENCOUNTER — Other Ambulatory Visit: Payer: Self-pay

## 2017-07-06 DIAGNOSIS — J441 Chronic obstructive pulmonary disease with (acute) exacerbation: Principal | ICD-10-CM | POA: Diagnosis present

## 2017-07-06 DIAGNOSIS — J440 Chronic obstructive pulmonary disease with acute lower respiratory infection: Secondary | ICD-10-CM | POA: Diagnosis present

## 2017-07-06 DIAGNOSIS — I272 Pulmonary hypertension, unspecified: Secondary | ICD-10-CM | POA: Diagnosis present

## 2017-07-06 DIAGNOSIS — I472 Ventricular tachycardia: Secondary | ICD-10-CM | POA: Diagnosis not present

## 2017-07-06 DIAGNOSIS — I248 Other forms of acute ischemic heart disease: Secondary | ICD-10-CM | POA: Diagnosis not present

## 2017-07-06 DIAGNOSIS — J189 Pneumonia, unspecified organism: Secondary | ICD-10-CM | POA: Diagnosis not present

## 2017-07-06 DIAGNOSIS — R0602 Shortness of breath: Secondary | ICD-10-CM | POA: Diagnosis not present

## 2017-07-06 DIAGNOSIS — Z87891 Personal history of nicotine dependence: Secondary | ICD-10-CM

## 2017-07-06 DIAGNOSIS — M069 Rheumatoid arthritis, unspecified: Secondary | ICD-10-CM | POA: Diagnosis not present

## 2017-07-06 DIAGNOSIS — J9601 Acute respiratory failure with hypoxia: Secondary | ICD-10-CM | POA: Diagnosis present

## 2017-07-06 DIAGNOSIS — I4729 Other ventricular tachycardia: Secondary | ICD-10-CM

## 2017-07-06 LAB — COMPREHENSIVE METABOLIC PANEL
ALBUMIN: 4.3 g/dL (ref 3.5–5.0)
ALT: 17 U/L (ref 17–63)
ANION GAP: 9 (ref 5–15)
AST: 29 U/L (ref 15–41)
Alkaline Phosphatase: 78 U/L (ref 38–126)
BILIRUBIN TOTAL: 1.3 mg/dL — AB (ref 0.3–1.2)
BUN: 14 mg/dL (ref 6–20)
CO2: 33 mmol/L — AB (ref 22–32)
Calcium: 9 mg/dL (ref 8.9–10.3)
Chloride: 98 mmol/L — ABNORMAL LOW (ref 101–111)
Creatinine, Ser: 0.99 mg/dL (ref 0.61–1.24)
GFR calc Af Amer: >60 mL/min (ref >60)
GFR calc non Af Amer: >60 mL/min (ref >60)
GLUCOSE: 95 mg/dL (ref 65–99)
POTASSIUM: 4.4 mmol/L (ref 3.5–5.1)
SODIUM: 140 mmol/L (ref 135–145)
TOTAL PROTEIN: 7.8 g/dL (ref 6.5–8.1)

## 2017-07-06 LAB — CBC WITH DIFFERENTIAL/PLATELET
BASOS ABS: 0 10*3/uL (ref 0.0–0.1)
BASOS PCT: 1 %
EOS PCT: 1 %
Eosinophils Absolute: 0 10*3/uL (ref 0.0–0.7)
HEMATOCRIT: 60.5 % — AB (ref 39.0–52.0)
Hemoglobin: 19.3 g/dL — ABNORMAL HIGH (ref 13.0–17.0)
LYMPHS PCT: 12 %
Lymphs Abs: 0.7 10*3/uL (ref 0.7–4.0)
MCH: 33.6 pg (ref 26.0–34.0)
MCHC: 31.9 g/dL (ref 30.0–36.0)
MCV: 105.2 fL — AB (ref 78.0–100.0)
Monocytes Absolute: 0.7 10*3/uL (ref 0.1–1.0)
Monocytes Relative: 13 %
NEUTROS ABS: 4.1 10*3/uL (ref 1.7–7.7)
Neutrophils Relative %: 74 %
PLATELETS: 129 10*3/uL — AB (ref 150–400)
RBC: 5.75 MIL/uL (ref 4.22–5.81)
RDW: 13.2 % (ref 11.5–15.5)
WBC: 5.6 10*3/uL (ref 4.0–10.5)

## 2017-07-06 LAB — TROPONIN I
Troponin I: 0.06 ng/mL (ref <0.03)
Troponin I: 0.05 ng/mL (ref <0.03)

## 2017-07-06 LAB — PHOSPHORUS: Phosphorus: 3.6 mg/dL (ref 2.5–4.6)

## 2017-07-06 LAB — MAGNESIUM: Magnesium: 1.8 mg/dL (ref 1.7–2.4)

## 2017-07-06 MED ORDER — DEXTROSE 5 % IV SOLN
1.0000 g | Freq: Once | INTRAVENOUS | Status: AC
  Administered 2017-07-06: 1 g via INTRAVENOUS
  Filled 2017-07-06: qty 10

## 2017-07-06 MED ORDER — DEXAMETHASONE SODIUM PHOSPHATE 10 MG/ML IJ SOLN
10.0000 mg | Freq: Once | INTRAMUSCULAR | Status: AC
  Administered 2017-07-06: 10 mg via INTRAVENOUS
  Filled 2017-07-06: qty 1

## 2017-07-06 MED ORDER — METHYLPREDNISOLONE SODIUM SUCC 40 MG IJ SOLR
40.0000 mg | Freq: Two times a day (BID) | INTRAMUSCULAR | Status: DC
  Administered 2017-07-06 – 2017-07-08 (×4): 40 mg via INTRAVENOUS
  Filled 2017-07-06 (×4): qty 1

## 2017-07-06 MED ORDER — FUROSEMIDE 40 MG PO TABS: ORAL_TABLET | ORAL | 0 refills | Status: DC

## 2017-07-06 MED ORDER — ADULT MULTIVITAMIN W/MINERALS CH
1.0000 | ORAL_TABLET | Freq: Every day | ORAL | Status: DC
  Administered 2017-07-07 – 2017-07-08 (×2): 1 via ORAL
  Filled 2017-07-06 (×2): qty 1

## 2017-07-06 MED ORDER — IPRATROPIUM-ALBUTEROL 0.5-2.5 (3) MG/3ML IN SOLN: 3.0000 mL | Freq: Four times a day (QID) | RESPIRATORY_TRACT | Status: DC | PRN

## 2017-07-06 MED ORDER — LEVOFLOXACIN IN D5W 750 MG/150ML IV SOLN
750.0000 mg | INTRAVENOUS | Status: DC
  Administered 2017-07-07: 750 mg via INTRAVENOUS
  Filled 2017-07-06: qty 150

## 2017-07-06 MED ORDER — AZITHROMYCIN 250 MG PO TABS
500.0000 mg | ORAL_TABLET | Freq: Once | ORAL | Status: AC
  Administered 2017-07-06: 500 mg via ORAL
  Filled 2017-07-06: qty 2

## 2017-07-06 MED ORDER — IPRATROPIUM-ALBUTEROL 0.5-2.5 (3) MG/3ML IN SOLN
3.0000 mL | Freq: Four times a day (QID) | RESPIRATORY_TRACT | Status: DC
  Administered 2017-07-06 – 2017-07-08 (×6): 3 mL via RESPIRATORY_TRACT
  Filled 2017-07-06 (×6): qty 3

## 2017-07-06 MED ORDER — FLUTICASONE PROPIONATE 50 MCG/ACT NA SUSP
2.0000 | Freq: Every day | NASAL | Status: DC
  Administered 2017-07-06 – 2017-07-08 (×3): 2 via NASAL
  Filled 2017-07-06: qty 16

## 2017-07-06 MED ORDER — VITAMIN B-12 1000 MCG PO TABS
1000.0000 ug | ORAL_TABLET | Freq: Every day | ORAL | Status: DC
  Administered 2017-07-06 – 2017-07-08 (×3): 1000 ug via ORAL
  Filled 2017-07-06 (×3): qty 1

## 2017-07-06 MED ORDER — ALBUTEROL (5 MG/ML) CONTINUOUS INHALATION SOLN
20.0000 mg/h | INHALATION_SOLUTION | Freq: Once | RESPIRATORY_TRACT | Status: AC
  Administered 2017-07-06: 20 mg/h via RESPIRATORY_TRACT
  Filled 2017-07-06: qty 20

## 2017-07-06 MED ORDER — LISINOPRIL 5 MG PO TABS
2.5000 mg | ORAL_TABLET | Freq: Every day | ORAL | Status: DC
  Administered 2017-07-07 – 2017-07-08 (×2): 2.5 mg via ORAL
  Filled 2017-07-06 (×2): qty 1

## 2017-07-06 MED ORDER — ALBUTEROL SULFATE (2.5 MG/3ML) 0.083% IN NEBU
5.0000 mg | INHALATION_SOLUTION | Freq: Once | RESPIRATORY_TRACT | Status: AC
  Administered 2017-07-06: 5 mg via RESPIRATORY_TRACT

## 2017-07-06 MED ORDER — ENOXAPARIN SODIUM 40 MG/0.4ML ~~LOC~~ SOLN
40.0000 mg | SUBCUTANEOUS | Status: DC
  Administered 2017-07-06 – 2017-07-07 (×2): 40 mg via SUBCUTANEOUS
  Filled 2017-07-06 (×2): qty 0.4

## 2017-07-06 MED ORDER — CARVEDILOL 3.125 MG PO TABS
3.1250 mg | ORAL_TABLET | Freq: Two times a day (BID) | ORAL | Status: DC
  Administered 2017-07-06 – 2017-07-08 (×4): 3.125 mg via ORAL
  Filled 2017-07-06 (×4): qty 1

## 2017-07-06 NOTE — ED Triage Notes (Signed)
Patient c/o shortness of breath and wheezing that started "right before Thanksgiving" and is progressively gotten worse. Patient seen today by Dr Sudie Bailey and sent here to ER. Patient was seen here on 11/19 with O2 sat of 76% and was told he needed to be admitted but didn't stay. Patient was discharged with prednisone. Patient has nebulizer at home, last used this morning with little to no relief.

## 2017-07-06 NOTE — H&P (Signed)
History and Physical  Derrick [name]:[id number] DOB: 05/03/1951 DOA: 07/06/2017  Referring physician: ER Physician, Dr. Jodi Mourning PCP: Gareth Morgan, MD  Outpatient Specialists:    Patient coming from: Home  Chief Complaint: SOB  HPI: Patient is a 66 year old African American Male with history of COPD, said to have been seen in the ER recently for COPD exacerbation but refused admission. Patient was seen at the Physician's office today with SOB and was advised to come to the ER for further assessment and management and likely admission. Patient is still arguing the need to be admitted. Patient endorsed having SOB and wheezing since Thanksgiving, but had been treating himself with over the counter medications. No fever or chills. Cough is productive of clear sputum. CXR is suggestive of mild right basilar opacity/atelectasis versus pneumonia. No headache, no neck pain, no chest pain, no GI symptoms and no urinary symptoms.  ED Course: Nebulizer treatment  Pertinent labs: See CXR Finding above. Imaging: independently reviewed.   Review of Systems:  12 systems reviewed. Pertinent positives and negatives as in HPI.  Past Medical History:  Diagnosis Date  . Allergic rhinitis due to pollen 09/08/2009  . COPD (chronic obstructive pulmonary disease) (HCC) 03/14/2013  . 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 Family history of diabetes mellitus 09/08/2009   father  . Family history of stroke (cerebrovascular) 09/08/2009   father  . Nicotine dependence, chewing tobacco, uncomplicated 08/07/1968  . Other disturbances of skin sensation 11/08/2012  . Perennial allergic rhinitis with seasonal variation 11/08/2012  . RA (rheumatoid arthritis) (HCC) 09/08/2009    Past Surgical History:  Procedure Laterality Date  . DENTAL SURGERY       reports that he has quit smoking. His smoking use included cigarettes. His smokeless tobacco use includes chew. He reports that  he does not drink alcohol or use drugs.  No Known Allergies  Family History  Problem Relation Age of Onset  . Hypertension Mother   . CVA Father   . Heart attack Brother      Prior to Admission medications   Medication Sig Start Date End Date Taking? Authorizing Provider  albuterol (PROVENTIL HFA;VENTOLIN HFA) 108 (90 Base) MCG/ACT inhaler Inhale 1-2 puffs into the lungs every 6 (six) hours as needed for wheezing or shortness of breath.   Yes [provider]  albuterol (PROVENTIL) (2.5 MG/3ML) 0.083% nebulizer solution INHALATION USE ONE AMPULE IN YOUR NEBULIZER UP TO 4 TIMES A DAY FOR BREATHING. 06/02/16  Yes [provider]  carvedilol (COREG) 3.125 MG tablet Take 1 tablet (3.125 mg total) by mouth 2 (two) times daily. 09/18/16 07/06/17 Yes Wendall Stade, MD  [employer] VITAMIN B12 1000 MCG tablet TAKE 2 TABLETS EACH DAY FOR VITAMIN B12 DEFICIENCY. 05/29/16  Yes [provider]  fluticasone (FLONASE) 50 MCG/ACT nasal spray USE 2 SPRAYS IN EACH NOSTRIL ONCE A DAY TO PREVENT NASAL ALLERGIES. 05/29/16  Yes [provider]  furosemide (LASIX) 40 MG tablet TAKE ONE TABLET BY MOUTH DAILY IF NEEDED FOR FLUID. Patient taking differently: Take 40 mg by mouth daily. TAKE ONE TABLET BY MOUTH DAILY IF NEEDED FOR FLUID. 07/06/17  Yes Antoine Poche, MD  KLOR-CON M20 20 MEQ tablet TAKE 1 TABLET BY MOUTH EVERY DAY 06/11/17  Yes Branch, Dorothe Pea, MD  lisinopril (PRINIVIL,ZESTRIL) 2.5 MG tablet TAKE 1 TABLET (2.5 MG TOTAL) BY MOUTH DAILY. 06/11/17 09/09/17 Yes Wendall Stade, MD  Multiple Vitamin (MULTIVITAMIN WITH MINERALS) TABS tablet  Take 1 tablet by mouth daily.   Yes [provider]  QVAR 80 MCG/ACT inhaler INHALE 2 PUFFS ONCE A DAY TO PREVENT BREATHING PROBLEMS. 05/30/16  Yes [provider]    Physical Exam: Vitals:   07/06/17 1503 07/06/17 1506 07/06/17 1600 07/06/17 1630  BP: (!) 163/91  (!) 145/84 (!) 143/89  Pulse: 91  93 91  Resp: (!) 26   (!) 25 (!) 26  Temp:      TempSrc:      SpO2: (!) 86% 91% 91% 92%  Weight:      Height:         Constitutional:  . Cachectic Eyes:  . No pallor. No jaundice.  ENMT:  . external ears, nose appear normal Neck:  . Neck is supple. No JVD Respiratory:  . Decreased air entry globally (patient has received Neb treatment prior to my examination) Cardiovascular:  . S1S2 . No LE extremity edema   Abdomen:  . Abdomen is soft and non tender. Organs are difficult to assess. Neurologic:  . Awake and alert. . Moves all limbs.  Wt Readings from Last 3 Encounters:  07/06/17 63.5 kg (140 lb)  06/25/17 63.5 kg (140 lb)  05/22/17 63.5 kg (140 lb)    I have personally reviewed following labs and imaging studies  Labs on Admission:  CBC: Recent Labs  Lab 07/06/17 1410  WBC 5.6  NEUTROABS 4.1  HGB 19.3*  HCT 60.5*  MCV 105.2*  PLT 129*   Basic Metabolic Panel: Recent Labs  Lab 07/06/17 1410  NA 140  K 4.4  CL 98*  CO2 33*  GLUCOSE 95  BUN 14  CREATININE 0.99  CALCIUM 9.0   Liver Function Tests: Recent Labs  Lab 07/06/17 1410  AST 29  ALT 17  ALKPHOS 78  BILITOT 1.3*  PROT 7.8  ALBUMIN 4.3   No results for input(s): LIPASE, AMYLASE in the last 168 hours. No results for input(s): AMMONIA in the last 168 hours. Coagulation Profile: No results for input(s): INR, PROTIME in the last 168 hours. Cardiac Enzymes: Recent Labs  Lab 07/06/17 1410  TROPONINI 0.06*   BNP (last 3 results) No results for input(s): PROBNP in the last 8760 hours. HbA1C: No results for input(s): HGBA1C in the last 72 hours. CBG: No results for input(s): GLUCAP in the last 168 hours. Lipid Profile: No results for input(s): CHOL, HDL, LDLCALC, TRIG, CHOLHDL, LDLDIRECT in the last 72 hours. Thyroid Function Tests: No results for input(s): TSH, T4TOTAL, FREET4, T3FREE, THYROIDAB in the last 72 hours. Anemia Panel: No results for input(s): VITAMINB12, FOLATE, FERRITIN, TIBC, IRON,  RETICCTPCT in the last 72 hours. Urine analysis: No results found for: COLORURINE, APPEARANCEUR, LABSPEC, PHURINE, GLUCOSEU, HGBUR, BILIRUBINUR, KETONESUR, PROTEINUR, UROBILINOGEN, NITRITE, LEUKOCYTESUR Sepsis Labs: @LABRCNTIP (procalcitonin:4,lacticidven:4) )No results found for this or any previous visit (from the past 240 hour(s)).    Radiological Exams on Admission: Dg Chest Port 1 View  Result Date: 07/06/2017 CLINICAL DATA:  Shortness of breath, wheezing x1 week EXAM: PORTABLE CHEST 1 VIEW COMPARISON:  06/25/2017 FINDINGS: Mild right basilar opacity, atelectasis versus pneumonia. Left lung is essentially clear. No pleural effusion or pneumothorax. Heart is normal in size. Right perihilar prominence, likely vascular. IMPRESSION: Mild right basilar opacity, atelectasis versus pneumonia. Electronically Signed   By: Charline Bills M.D.   On: 07/06/2017 14:04    Active Problems:   COPD exacerbation (HCC)   Assessment/Plan 1. COPD with exacerbation 2. Acute respiratory failure 3. Possible atelectasis 4. Possible  pneumonia     Admit patient  IV Steroids  Antibiotics  Nebulizer treatment  Sputum culture  Further management will depend on hospital course  DVT prophylaxis:[place] Lovenox Code Status: Full Family Communication:  Disposition Plan: Home eventually   Consults called: None   Admission status: Inpatient    Time spent: 61 minutes  Berton Mount, MD  [facility] Pager #: [phone] 7PM-7AM contact night coverage as above   07/06/2017, 5:00 PM

## 2017-07-06 NOTE — Telephone Encounter (Signed)
Refilled lasix per fax request 

## 2017-07-06 NOTE — ED Provider Notes (Signed)
[facility] EMERGENCY DEPARTMENT Provider Note   CSN: [id number] Arrival date & time: 07/06/17  1223     History   Chief Complaint Chief Complaint  Patient presents with  . Shortness of Breath    HPI Derrick [name] is a 66 y.o. male.  Patient presents with worsening shortness of breath for the past week. Patient's had productive cough as well and history of pneumonia. Patient has possible COPD history. Patient was sent over by primary care doctor for worsening breathing. Patient left AGAINST MEDICAL ADVICE last visit. No current steroids. Patient denies heart attack or blood clot history.      Past Medical History:  Diagnosis Date  . Allergic rhinitis due to pollen 09/08/2009  . COPD (chronic obstructive pulmonary disease) (HCC) 03/14/2013  . 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 Family history of diabetes mellitus 09/08/2009   father  . Family history of stroke (cerebrovascular) 09/08/2009   father  . Nicotine dependence, chewing tobacco, uncomplicated 08/07/1968  . Other disturbances of skin sensation 11/08/2012  . Perennial allergic rhinitis with seasonal variation 11/08/2012  . RA (rheumatoid arthritis) (HCC) 09/08/2009    There are no active problems to display for this patient.   Past Surgical History:  Procedure Laterality Date  . DENTAL SURGERY         Home Medications    Prior to Admission medications   Medication Sig Start Date End Date Taking? Authorizing Provider  albuterol (PROVENTIL) (2.5 MG/3ML) 0.083% nebulizer solution INHALATION USE ONE AMPULE IN YOUR NEBULIZER UP TO 4 TIMES A DAY FOR BREATHING. 06/02/16   [provider]  carvedilol (COREG) 3.125 MG tablet Take 1 tablet (3.125 mg total) by mouth 2 (two) times daily. 09/18/16 06/25/17  Wendall Stade, MD  [employer] VITAMIN B12 1000 MCG tablet TAKE 2 TABLETS EACH DAY FOR VITAMIN B12 DEFICIENCY. 05/29/16   [provider]  doxycycline (VIBRAMYCIN) 100  MG capsule Take 1 capsule (100 mg total) 2 (two) times daily by mouth. One po bid x 7 days 06/25/17   Mesner, Barbara Cower, MD  fluticasone (FLONASE) 50 MCG/ACT nasal spray USE 2 SPRAYS IN EACH NOSTRIL ONCE A DAY TO PREVENT NASAL ALLERGIES. 05/29/16   [provider]  furosemide (LASIX) 40 MG tablet TAKE ONE TABLET BY MOUTH DAILY IF NEEDED FOR FLUID. 07/06/17   Antoine Poche, MD  KLOR-CON M20 20 MEQ tablet TAKE 1 TABLET BY MOUTH EVERY DAY 06/11/17   Antoine Poche, MD  lisinopril (PRINIVIL,ZESTRIL) 2.5 MG tablet TAKE 1 TABLET (2.5 MG TOTAL) BY MOUTH DAILY. 06/11/17 09/09/17  Wendall Stade, MD  predniSONE (DELTASONE) 20 MG tablet 2 tabs po daily x 4 days 06/25/17   Mesner, Barbara Cower, MD  QVAR 80 MCG/ACT inhaler INHALE 2 PUFFS ONCE A DAY TO PREVENT BREATHING PROBLEMS. 05/30/16   [provider]    Family History Family History  Problem Relation Age of Onset  . Hypertension Mother   . CVA Father   . Heart attack Brother     Social History Social History   Tobacco Use  . Smoking status: Former Smoker    Types: Cigarettes  . Smokeless tobacco: Current User    Types: Chew  . Tobacco comment: has chewed tobacco for 40+ years  Substance Use Topics  . Alcohol use: No  . Drug use: No     Allergies   Patient has no known allergies.   Review of Systems Review of Systems  Constitutional: Negative for  chills and fever.  HENT: Negative for congestion.   Eyes: Negative for visual disturbance.  Respiratory: Positive for cough and shortness of breath.   Cardiovascular: Negative for chest pain.  Gastrointestinal: Negative for abdominal pain and vomiting.  Genitourinary: Negative for dysuria and flank pain.  Musculoskeletal: Negative for back pain, neck pain and neck stiffness.  Skin: Negative for rash.  Neurological: Negative for light-headedness and headaches.     Physical Exam Updated Vital Signs BP (!) 137/114 (BP Location: Left Arm)   Pulse 86   Resp (!) 32   Ht  5' 9.5" (1.765 m)   Wt 63.5 kg (140 lb)   SpO2 100%   BMI 20.38 kg/m   Physical Exam  Constitutional: He is oriented to person, place, and time. He appears well-developed and well-nourished. He is not intubated.  HENT:  Head: Normocephalic and atraumatic.  Eyes: Conjunctivae are normal. Right eye exhibits no discharge. Left eye exhibits no discharge.  Neck: Normal range of motion. Neck supple. No tracheal deviation present.  Cardiovascular: Normal rate and regular rhythm.  Pulmonary/Chest: Accessory muscle usage present. Tachypnea noted. He is not intubated. He has wheezes. He has rales.  Abdominal: Soft. He exhibits no distension. There is no tenderness. There is no guarding.  Musculoskeletal: He exhibits no edema.  Neurological: He is alert and oriented to person, place, and time.  Skin: Skin is warm. No rash noted.  Psychiatric: He has a normal mood and affect.  Nursing note and vitals reviewed.    ED Treatments / Results  Labs (all labs ordered are listed, but only abnormal results are displayed) Labs Reviewed  CBC WITH DIFFERENTIAL/PLATELET  CBC WITH DIFFERENTIAL/PLATELET  TROPONIN I  COMPREHENSIVE METABOLIC PANEL    EKG  EKG Interpretation None       Radiology Dg Chest Port 1 View  Result Date: 07/06/2017 CLINICAL DATA:  Shortness of breath, wheezing x1 week EXAM: PORTABLE CHEST 1 VIEW COMPARISON:  06/25/2017 FINDINGS: Mild right basilar opacity, atelectasis versus pneumonia. Left lung is essentially clear. No pleural effusion or pneumothorax. Heart is normal in size. Right perihilar prominence, likely vascular. IMPRESSION: Mild right basilar opacity, atelectasis versus pneumonia. Electronically Signed   By: Charline Bills M.D.   On: 07/06/2017 14:04    Procedures Procedures (including critical care time)  Medications Ordered in ED Medications  cefTRIAXone (ROCEPHIN) 1 g in dextrose 5 % 50 mL IVPB (not administered)  azithromycin (ZITHROMAX) tablet 500 mg  (not administered)  albuterol (PROVENTIL) (2.5 MG/3ML) 0.083% nebulizer solution 5 mg (5 mg Nebulization Given 07/06/17 1253)  albuterol (PROVENTIL,VENTOLIN) solution continuous neb (20 mg/hr Nebulization Given 07/06/17 1345)  dexamethasone (DECADRON) injection 10 mg (10 mg Intravenous Given 07/06/17 1356)     Initial Impression / Assessment and Plan / ED Course  I have reviewed the triage vital signs and the nursing notes.  Pertinent labs & imaging results that were available during my care of the patient were reviewed by me and considered in my medical decision making (see chart for details).    Patient presents with worsening shortness of breath, hypoxia, wheezing and recent diagnosis of pneumonia. Plan for continuous neb, blood work, repeat chest x-ray and recommended admission to the patient. Clinical and chest x-ray concerning for pneumonia. Patient's vital signs and oxygenation improved in the ER. Plan for admission to telemetry for recurrent nebs, antibiotics and further assessment.  The patients results and plan were reviewed and discussed.   Any x-rays performed were independently reviewed by myself.  Differential diagnosis were considered with the presenting HPI.  Medications  cefTRIAXone (ROCEPHIN) 1 g in dextrose 5 % 50 mL IVPB (not administered)  azithromycin (ZITHROMAX) tablet 500 mg (not administered)  albuterol (PROVENTIL) (2.5 MG/3ML) 0.083% nebulizer solution 5 mg (5 mg Nebulization Given 07/06/17 1253)  albuterol (PROVENTIL,VENTOLIN) solution continuous neb (20 mg/hr Nebulization Given 07/06/17 1345)  dexamethasone (DECADRON) injection 10 mg (10 mg Intravenous Given 07/06/17 1356)    Vitals:   07/06/17 1253 07/06/17 1345 07/06/17 1357 07/06/17 1411  BP:   (!) 140/94 (!) 137/114  Pulse:   85 86  Resp:   19 (!) 32  SpO2: (!) 76% (!) 83% 91% 100%  Weight:      Height:        Final diagnoses:  Acute exacerbation of chronic obstructive pulmonary disease (COPD)  (HCC)  Community acquired pneumonia, unspecified laterality    Admission/ observation were discussed with the admitting physician, patient and/or family and they are comfortable with the plan.    Final Clinical Impressions(s) / ED Diagnoses   Final diagnoses:  Acute exacerbation of chronic obstructive pulmonary disease (COPD) (HCC)  Community acquired pneumonia, unspecified laterality    ED Discharge Orders    None       Blane Ohara, MD 07/09/17 0116

## 2017-07-07 DIAGNOSIS — J189 Pneumonia, unspecified organism: Secondary | ICD-10-CM | POA: Diagnosis not present

## 2017-07-07 DIAGNOSIS — J441 Chronic obstructive pulmonary disease with (acute) exacerbation: Secondary | ICD-10-CM | POA: Diagnosis not present

## 2017-07-07 LAB — BASIC METABOLIC PANEL WITH GFR
Anion gap: 6 (ref 5–15)
BUN: 11 mg/dL (ref 6–20)
CO2: 33 mmol/L — ABNORMAL HIGH (ref 22–32)
Calcium: 8.5 mg/dL — ABNORMAL LOW (ref 8.9–10.3)
Chloride: 101 mmol/L (ref 101–111)
Creatinine, Ser: 0.71 mg/dL (ref 0.61–1.24)
GFR calc Af Amer: >60 mL/min
GFR calc non Af Amer: >60 mL/min
Glucose, Bld: 108 mg/dL — ABNORMAL HIGH (ref 65–99)
Potassium: 5.1 mmol/L (ref 3.5–5.1)
Sodium: 140 mmol/L (ref 135–145)

## 2017-07-07 LAB — CBC
HCT: 51.5 % (ref 39.0–52.0)
Hemoglobin: 15.9 g/dL (ref 13.0–17.0)
MCH: 32.4 pg (ref 26.0–34.0)
MCHC: 30.9 g/dL (ref 30.0–36.0)
MCV: 105.1 fL — ABNORMAL HIGH (ref 78.0–100.0)
Platelets: 130 10*3/uL — ABNORMAL LOW (ref 150–400)
RBC: 4.9 MIL/uL (ref 4.22–5.81)
RDW: 13.1 % (ref 11.5–15.5)
WBC: 6.2 10*3/uL (ref 4.0–10.5)

## 2017-07-07 LAB — HIV ANTIBODY (ROUTINE TESTING W REFLEX): HIV Screen 4th Generation wRfx: NONREACTIVE

## 2017-07-07 NOTE — Progress Notes (Signed)
PROGRESS NOTE    Derrick [name]  IDP:[id number] DOB: 07-16-1951 DOA: 07/06/2017 PCP: Gareth Morgan, MD     Brief Narrative:  66 year old man admitted from home on 11/30 due to shortness of breath.  He has a history of COPD.  He was seen recently in the ED for COPD exacerbation but refused admission at that time.  He went to see his PCP on the day of admission and was advised to come to the ER for further assessment and management.  He continued to wheeze despite treatments given in the ED and admission was requested.   Assessment & Plan:   Active Problems:   COPD exacerbation (HCC)   Acute hypoxemic respiratory failure -On account of COPD with acute exacerbation as well as community-acquired pneumonia. -Plan to continue Levaquin, nebs, steroids at current dose without titrating.  Elevated troponin -Likely represents demand ischemia in the face of acute hypoxemic respiratory failure with COPD exacerbation and pneumonia. -Troponins have been flat with a max of 0.06, he denies chest pain. -Echo in October 2018 shows an ejection fraction of 60-65% and no wall motion abnormalities. -No need for further cardiac workup at present.   DVT prophylaxis: Lovenox Code Status: Full code Family Communication: Patient only Disposition Plan: Anticipate discharge home over the next 24-48 hours  Consultants:   None  Procedures:   None  Antimicrobials:  Anti-infectives (From admission, onward)   Start     Dose/Rate Route Frequency Ordered Stop   07/07/17 1500  levofloxacin (LEVAQUIN) IVPB 750 mg     750 mg 100 mL/hr over 90 Minutes Intravenous Every 24 hours 07/06/17 1732     07/06/17 1430  cefTRIAXone (ROCEPHIN) 1 g in dextrose 5 % 50 mL IVPB     1 g 100 mL/hr over 30 Minutes Intravenous  Once 07/06/17 1421 07/06/17 1546   07/06/17 1430  azithromycin (ZITHROMAX) tablet 500 mg     500 mg Oral  Once 07/06/17 1421 07/06/17 1459       Subjective: Feels less short of breath, still  coughing, denies chest pain  Objective: Vitals:   07/07/17 0918 07/07/17 0920 07/07/17 1307 07/07/17 1508  BP:    (!) 141/87  Pulse:    89  Resp:    18  Temp:    98.1 F (36.7 C)  TempSrc:      SpO2: (!) 88% 93% (!) 88% 94%  Weight:      Height:        Intake/Output Summary (Last 24 hours) at 07/07/2017 1700 Last data filed at 07/06/2017 1800 Gross per 24 hour  Intake 200 ml  Output -  Net 200 ml   Filed Weights   07/06/17 1242 07/06/17 1817  Weight: 63.5 kg (140 lb) 63.5 kg (140 lb)    Examination:  General exam: Alert, awake, oriented x 3 Respiratory system: Minimal expiratory wheezes Cardiovascular system:RRR. No murmurs, rubs, gallops. Gastrointestinal system: Abdomen is nondistended, soft and nontender. No organomegaly or masses felt. Normal bowel sounds heard. Central nervous system: Alert and oriented. No focal neurological deficits. Extremities: No C/C/E, +pedal pulses Skin: No rashes, lesions or ulcers Psychiatry: Judgement and insight appear normal. Mood & affect appropriate.     Data Reviewed: I have personally reviewed following labs and imaging studies  CBC: Recent Labs  Lab 07/06/17 1410 07/07/17 0625  WBC 5.6 6.2  NEUTROABS 4.1  --   HGB 19.3* 15.9  HCT 60.5* 51.5  MCV 105.2* 105.1*  PLT 129* 130*   Basic  Metabolic Panel: Recent Labs  Lab 07/06/17 1410 07/06/17 1815 07/07/17 0625  NA 140  --  140  K 4.4  --  5.1  CL 98*  --  101  CO2 33*  --  33*  GLUCOSE 95  --  108*  BUN 14  --  11  CREATININE 0.99  --  0.71  CALCIUM 9.0  --  8.5*  MG  --  1.8  --   PHOS  --  3.6  --    GFR: Estimated Creatinine Clearance: 83.8 mL/min (by C-G formula based on SCr of 0.71 mg/dL). Liver Function Tests: Recent Labs  Lab 07/06/17 1410  AST 29  ALT 17  ALKPHOS 78  BILITOT 1.3*  PROT 7.8  ALBUMIN 4.3   No results for input(s): LIPASE, AMYLASE in the last 168 hours. No results for input(s): AMMONIA in the last 168 hours. Coagulation  Profile: No results for input(s): INR, PROTIME in the last 168 hours. Cardiac Enzymes: Recent Labs  Lab 07/06/17 1410 07/06/17 1815  TROPONINI 0.06* 0.05*   BNP (last 3 results) No results for input(s): PROBNP in the last 8760 hours. HbA1C: No results for input(s): HGBA1C in the last 72 hours. CBG: No results for input(s): GLUCAP in the last 168 hours. Lipid Profile: No results for input(s): CHOL, HDL, LDLCALC, TRIG, CHOLHDL, LDLDIRECT in the last 72 hours. Thyroid Function Tests: No results for input(s): TSH, T4TOTAL, FREET4, T3FREE, THYROIDAB in the last 72 hours. Anemia Panel: No results for input(s): VITAMINB12, FOLATE, FERRITIN, TIBC, IRON, RETICCTPCT in the last 72 hours. Urine analysis: No results found for: COLORURINE, APPEARANCEUR, LABSPEC, PHURINE, GLUCOSEU, HGBUR, BILIRUBINUR, KETONESUR, PROTEINUR, UROBILINOGEN, NITRITE, LEUKOCYTESUR Sepsis Labs: @LABRCNTIP (procalcitonin:4,lacticidven:4)  )No results found for this or any previous visit (from the past 240 hour(s)).       Radiology Studies: Dg Chest Port 1 View  Result Date: 07/06/2017 CLINICAL DATA:  Shortness of breath, wheezing x1 week EXAM: PORTABLE CHEST 1 VIEW COMPARISON:  06/25/2017 FINDINGS: Mild right basilar opacity, atelectasis versus pneumonia. Left lung is essentially clear. No pleural effusion or pneumothorax. Heart is normal in size. Right perihilar prominence, likely vascular. IMPRESSION: Mild right basilar opacity, atelectasis versus pneumonia. Electronically Signed   By: 06/27/2017 M.D.   On: 07/06/2017 14:04        Scheduled Meds: . carvedilol  3.125 mg Oral BID WC  . enoxaparin (LOVENOX) injection  40 mg Subcutaneous Q24H  . fluticasone  2 spray Each Nare Daily  . ipratropium-albuterol  3 mL Nebulization Q6H  . lisinopril  2.5 mg Oral Daily  . methylPREDNISolone (SOLU-MEDROL) injection  40 mg Intravenous Q12H  . multivitamin with minerals  1 tablet Oral Daily  . cyanocobalamin   1,000 mcg Oral Daily   Continuous Infusions: . levofloxacin (LEVAQUIN) IV       LOS: 1 day    Time spent: 25 minutes. Greater than 50% of this time was spent in direct contact with the patient coordinating care.     07/08/2017, MD [facility] Pager [phone]  If 7PM-7AM, please contact night-coverage [email or web address] Password [email or web address] 07/07/2017, 5:00 PM

## 2017-07-08 DIAGNOSIS — I272 Pulmonary hypertension, unspecified: Secondary | ICD-10-CM

## 2017-07-08 DIAGNOSIS — I4729 Other ventricular tachycardia: Secondary | ICD-10-CM

## 2017-07-08 DIAGNOSIS — I472 Ventricular tachycardia: Secondary | ICD-10-CM | POA: Diagnosis not present

## 2017-07-08 DIAGNOSIS — J189 Pneumonia, unspecified organism: Secondary | ICD-10-CM | POA: Diagnosis not present

## 2017-07-08 DIAGNOSIS — J441 Chronic obstructive pulmonary disease with (acute) exacerbation: Secondary | ICD-10-CM | POA: Diagnosis not present

## 2017-07-08 LAB — BASIC METABOLIC PANEL
Anion gap: 8 (ref 5–15)
BUN: 13 mg/dL (ref 6–20)
CHLORIDE: 95 mmol/L — AB (ref 101–111)
CO2: 34 mmol/L — AB (ref 22–32)
CREATININE: 0.79 mg/dL (ref 0.61–1.24)
Calcium: 9.4 mg/dL (ref 8.9–10.3)
GFR calc Af Amer: >60 mL/min (ref >60)
GFR calc non Af Amer: >60 mL/min (ref >60)
Glucose, Bld: 104 mg/dL — ABNORMAL HIGH (ref 65–99)
Potassium: 5.3 mmol/L — ABNORMAL HIGH (ref 3.5–5.1)
Sodium: 137 mmol/L (ref 135–145)

## 2017-07-08 LAB — MAGNESIUM: Magnesium: 1.9 mg/dL (ref 1.7–2.4)

## 2017-07-08 MED ORDER — LEVOFLOXACIN 500 MG PO TABS: 500.0000 mg | ORAL_TABLET | Freq: Every day | ORAL | 0 refills | Status: AC

## 2017-07-08 MED ORDER — IPRATROPIUM-ALBUTEROL 0.5-2.5 (3) MG/3ML IN SOLN: 3.0000 mL | Freq: Two times a day (BID) | RESPIRATORY_TRACT | Status: DC

## 2017-07-08 MED ORDER — PREDNISONE 10 MG PO TABS: 10.0000 mg | ORAL_TABLET | Freq: Every day | ORAL | 0 refills | Status: DC

## 2017-07-08 MED ORDER — METOPROLOL TARTRATE 25 MG PO TABS: 12.5000 mg | ORAL_TABLET | Freq: Every day | ORAL | 3 refills | Status: DC

## 2017-07-08 MED ORDER — ALBUTEROL SULFATE HFA 108 (90 BASE) MCG/ACT IN AERS: 1.0000 | INHALATION_SPRAY | Freq: Four times a day (QID) | RESPIRATORY_TRACT | 3 refills | Status: DC | PRN

## 2017-07-08 NOTE — Discharge Summary (Signed)
Physician Discharge Summary  Derrick [name] QMV:[id number] DOB: 1951-01-11 DOA: 07/06/2017  PCP: Gareth Morgan, MD  Admit date: 07/06/2017 Discharge date: 07/08/2017  Time spent: 45 minutes  Recommendations for Outpatient Follow-up:  -To be discharged home today. -Advised to follow-up with PCP in 2 weeks. -Also advised to follow-up with his cardiologist in 2-3 weeks.  Discharge Diagnoses:  Active Problems:   COPD exacerbation (HCC)   NSVT (nonsustained ventricular tachycardia) (HCC)   Pulmonary HTN (HCC)   Discharge Condition: Stable and improved  Filed Weights   07/06/17 1242 07/06/17 1817  Weight: 63.5 kg (140 lb) 63.5 kg (140 lb)    History of present illness:  As per Dr. Dartha Lodge on 11/30:  Patient is a 66 year old African American Male with history of COPD, said to have been seen in the ER recently for COPD exacerbation but refused admission. Patient was seen at the Physician's office today with SOB and was advised to come to the ER for further assessment and management and likely admission. Patient is still arguing the need to be admitted. Patient endorsed having SOB and wheezing since Thanksgiving, but had been treating himself with over the counter medications. No fever or chills. Cough is productive of clear sputum. CXR is suggestive of mild right basilar opacity/atelectasis versus pneumonia. No headache, no neck pain, no chest pain, no GI symptoms and no urinary symptoms.    Hospital Course:   Acute hypoxemic respiratory failure -On account of COPD with acute exacerbation as well as community-acquired pneumonia. -Plan to continue Levaquin, nebs, steroids at current dose without titrating. -Currently has been weaned completely off oxygen.  Elevated troponin -Likely represents demand ischemia in the face of acute hypoxemic respiratory failure with COPD exacerbation and pneumonia. -Troponins have been flat with a max of 0.06, he denies chest pain. -Echo in  October 2018 shows an ejection fraction of 60-65% and no wall motion abnormalities. -No need for further cardiac workup at present.  Nonsustained ventricular tachycardia -Longest stretch was 30 beats. -Patient has remained completely asymptomatic through this. -Electrolytes are within normal limits. -Echocardiogram in October 2018 with results as above.  See no need to repeat. -Likely precipitated by beta agonists given for acute COPD. -Have started low-dose metoprolol and recommended outpatient follow-up with cardiology.    Procedures:  None   Consultations:  None  Discharge Instructions  Discharge Instructions    Diet - low sodium heart healthy   Complete by:  As directed    Increase activity slowly   Complete by:  As directed      Allergies as of 07/08/2017   No Known Allergies     Medication List    TAKE these medications   albuterol (2.5 MG/3ML) 0.083% nebulizer solution Commonly known as:  PROVENTIL INHALATION USE ONE AMPULE IN YOUR NEBULIZER UP TO 4 TIMES A DAY FOR BREATHING.   albuterol 108 (90 Base) MCG/ACT inhaler Commonly known as:  PROVENTIL HFA;VENTOLIN HFA Inhale 1-2 puffs into the lungs every 6 (six) hours as needed for wheezing or shortness of breath.   carvedilol 3.125 MG tablet Commonly known as:  COREG Take 1 tablet (3.125 mg total) by mouth 2 (two) times daily.   [employer] VITAMIN B12 1000 MCG tablet Generic drug:  cyanocobalamin TAKE 2 TABLETS EACH DAY FOR VITAMIN B12 DEFICIENCY.   fluticasone 50 MCG/ACT nasal spray Commonly known as:  FLONASE USE 2 SPRAYS IN EACH NOSTRIL ONCE A DAY TO PREVENT NASAL ALLERGIES.   furosemide 40 MG tablet  Commonly known as:  LASIX TAKE ONE TABLET BY MOUTH DAILY IF NEEDED FOR FLUID.   KLOR-CON M20 20 MEQ tablet Generic drug:  potassium chloride SA TAKE 1 TABLET BY MOUTH EVERY DAY   levofloxacin 500 MG tablet Commonly known as:  LEVAQUIN Take 1 tablet (500 mg total) by mouth daily for 5 days.   lisinopril  2.5 MG tablet Commonly known as:  PRINIVIL,ZESTRIL TAKE 1 TABLET (2.5 MG TOTAL) BY MOUTH DAILY.   metoprolol tartrate 25 MG tablet Commonly known as:  LOPRESSOR Take 0.5 tablets (12.5 mg total) by mouth daily.   multivitamin with minerals Tabs tablet Take 1 tablet by mouth daily.   predniSONE 10 MG tablet Commonly known as:  DELTASONE Take 1 tablet (10 mg total) by mouth daily with breakfast. Take 6 tablets today and then decrease by 1 tablet daily until none are left.   QVAR 80 MCG/ACT inhaler Generic drug:  beclomethasone INHALE 2 PUFFS ONCE A DAY TO PREVENT BREATHING PROBLEMS.      No Known Allergies Follow-up Information    Gareth Morgan, MD. Schedule an appointment as soon as possible for a visit in 2 week(s).   Specialty:  Family Medicine Contact information: [address] [place] [address] [phone]            The results of significant diagnostics from this hospitalization (including imaging, microbiology, ancillary and laboratory) are listed below for reference.    Significant Diagnostic Studies: Dg Chest 2 View  Result Date: 06/25/2017 CLINICAL DATA:  Cough, chest congestion, and shortness of breath. Low O2 saturation. COPD. EXAM: CHEST  2 VIEW COMPARISON:  05/30/2016 and 12/03/2012 FINDINGS: There is chronic cardiomegaly with aortic atherosclerosis. There is slight haziness in the left perihilar region which could represent early infiltrate. Emphysematous changes bilaterally. No effusions. No acute bone abnormality. IMPRESSION: Emphysema with a faint area of haziness in the left perihilar region which may represent an early infiltrate. Electronically Signed   By: Francene Boyers M.D.   On: 06/25/2017 11:33   Dg Chest Port 1 View  Result Date: 07/06/2017 CLINICAL DATA:  Shortness of breath, wheezing x1 week EXAM: PORTABLE CHEST 1 VIEW COMPARISON:  06/25/2017 FINDINGS: Mild right basilar opacity, atelectasis versus pneumonia. Left lung is  essentially clear. No pleural effusion or pneumothorax. Heart is normal in size. Right perihilar prominence, likely vascular. IMPRESSION: Mild right basilar opacity, atelectasis versus pneumonia. Electronically Signed   By: Charline Bills M.D.   On: 07/06/2017 14:04    Microbiology: No results found for this or any previous visit (from the past 240 hour(s)).   Labs: Basic Metabolic Panel: Recent Labs  Lab 07/06/17 1410 07/06/17 1815 07/07/17 0625 07/08/17 0806  NA 140  --  140 137  K 4.4  --  5.1 5.3*  CL 98*  --  101 95*  CO2 33*  --  33* 34*  GLUCOSE 95  --  108* 104*  BUN 14  --  11 13  CREATININE 0.99  --  0.71 0.79  CALCIUM 9.0  --  8.5* 9.4  MG  --  1.8  --  1.9  PHOS  --  3.6  --   --    Liver Function Tests: Recent Labs  Lab 07/06/17 1410  AST 29  ALT 17  ALKPHOS 78  BILITOT 1.3*  PROT 7.8  ALBUMIN 4.3   No results for input(s): LIPASE, AMYLASE in the last 168 hours. No results for input(s): AMMONIA in the last 168 hours.  CBC: Recent Labs  Lab 07/06/17 1410 07/07/17 0625  WBC 5.6 6.2  NEUTROABS 4.1  --   HGB 19.3* 15.9  HCT 60.5* 51.5  MCV 105.2* 105.1*  PLT 129* 130*   Cardiac Enzymes: Recent Labs  Lab 07/06/17 1410 07/06/17 1815  TROPONINI 0.06* 0.05*   BNP: BNP (last 3 results) Recent Labs    06/25/17 1213  BNP 799.0*    ProBNP (last 3 results) No results for input(s): PROBNP in the last 8760 hours.  CBG: No results for input(s): GLUCAP in the last 168 hours.     Signed:  Chaya Jan  [facility] Pager: [phone] 07/08/2017, 2:47 PM

## 2017-07-08 NOTE — Progress Notes (Addendum)
Alerted by tele that patient had seven beat run SVT, pt asymptomatic, all vitals stable. Currently in NSR. Some telemetry leads were off, these were replaced. MD made aware, will continue to monitor.  New orders placed.

## 2017-07-08 NOTE — Progress Notes (Signed)
IV removed, 2x2 gauze and paper tape applied to site, patient tolerated well, reviewed discharge information with patient who verbalized understanding. Patient awaiting family to arrive to transport home.

## 2017-07-08 NOTE — Progress Notes (Signed)
Received call from tele patient had over 30 beat run of V. Tach, notified Dr. Ardyth Harps, hospitalist, who is aware that patient has had V. Tach runs in the past and patient denies any chest pain or SOB during the episode of V. Tach.  Patient A&Ox 4.  Dr. Ardyth Harps okay with discharge of patient to home, states she will metoprolol for patient to go home with.

## 2017-07-23 ENCOUNTER — Other Ambulatory Visit: Payer: Self-pay | Admitting: Cardiovascular Disease

## 2017-08-06 ENCOUNTER — Other Ambulatory Visit: Payer: Self-pay | Admitting: Cardiology

## 2017-11-01 ENCOUNTER — Ambulatory Visit: Payer: BLUE CROSS/BLUE SHIELD | Admitting: Cardiology

## 2017-11-09 ENCOUNTER — Other Ambulatory Visit: Payer: Self-pay | Admitting: Cardiology

## 2017-12-03 ENCOUNTER — Encounter: Payer: Self-pay | Admitting: Cardiology

## 2017-12-03 ENCOUNTER — Ambulatory Visit (INDEPENDENT_AMBULATORY_CARE_PROVIDER_SITE_OTHER): Payer: Medicare HMO | Admitting: Cardiology

## 2017-12-03 VITALS — BP 126/88 | HR 67 | Ht 69.5 in | Wt 135.8 lb

## 2017-12-03 DIAGNOSIS — I272 Pulmonary hypertension, unspecified: Secondary | ICD-10-CM

## 2017-12-03 DIAGNOSIS — J441 Chronic obstructive pulmonary disease with (acute) exacerbation: Secondary | ICD-10-CM

## 2017-12-03 DIAGNOSIS — I5022 Chronic systolic (congestive) heart failure: Secondary | ICD-10-CM

## 2017-12-03 MED ORDER — PREDNISONE 20 MG PO TABS: 40.0000 mg | ORAL_TABLET | Freq: Every day | ORAL | 0 refills | Status: DC

## 2017-12-03 MED ORDER — AZITHROMYCIN 250 MG PO TABS: ORAL_TABLET | ORAL | 0 refills | Status: DC

## 2017-12-03 NOTE — Progress Notes (Signed)
Clinical Summary Derrick [name] is a 67 y.o.male seen today for follow up of the following medical problems.    1. Dyspnea - echo 06/2016 LVEF 45-50%, diffuse hypokinesis, cannot eval diastolic function. Moderate AI, mild MR. PASP 50 (not 150) - severe LVH. Septum 20 mm, posterior wall 18 mm - cardiac MRI 12/2016: moderate LV septal thickness 15 mm, posterior wall 9 mm. No SAM or LVOT gradient, gadolinium pattern not consistent with hypertrophic CM but consider amyloid. Moderate AI. Normal RV size and function. Normal atria.  - UPEP/SPEP overall unremarkable.   - No recent LE edema, no abdominal distension. Takes lasix prn.  2. COPD - poor compliance with inhalers.   - some recent cough, congestion, wheezing  3. Pulmonary hypertension - repeat echo 05/2017 with PASP 88. Could not evaluate diastolic function, normal LA size would argue against significant dysfunction - he does have history of COPD, rheumatoid arthritis  as well. +ANA previously - he has refused RHC    4. NSVT - wide complex rhythm during admission 07/2017 reported as NSVT, does not appear patient evaluated by cardiology at the time. Longest run reported at 30 beats. Thoughts due to use of beta agonists for his COPD exacberation. Started on metoprolol.   - no recent palpitaitons.      Past Medical History:  Diagnosis Date  . Allergic rhinitis due to pollen 09/08/2009  . COPD (chronic obstructive pulmonary disease) (HCC) 03/14/2013  . 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 Family history of diabetes mellitus 09/08/2009   father  . Family history of stroke (cerebrovascular) 09/08/2009   father  . Nicotine dependence, chewing tobacco, uncomplicated 08/07/1968  . Other disturbances of skin sensation 11/08/2012  . Perennial allergic rhinitis with seasonal variation 11/08/2012  . RA (rheumatoid arthritis) (HCC) 09/08/2009     No Known Allergies   Current Outpatient Medications   Medication Sig Dispense Refill  . albuterol (PROVENTIL HFA;VENTOLIN HFA) 108 (90 Base) MCG/ACT inhaler Inhale 1-2 puffs into the lungs every 6 (six) hours as needed for wheezing or shortness of breath. 1 Inhaler 3  . albuterol (PROVENTIL) (2.5 MG/3ML) 0.083% nebulizer solution INHALATION USE ONE AMPULE IN YOUR NEBULIZER UP TO 4 TIMES A DAY FOR BREATHING.  11  . carvedilol (COREG) 3.125 MG tablet TAKE 1 TABLET (3.125 MG TOTAL) BY MOUTH 2 (TWO) TIMES DAILY. 180 tablet 2  . [employer] VITAMIN B12 1000 MCG tablet TAKE 2 TABLETS EACH DAY FOR VITAMIN B12 DEFICIENCY.  11  . fluticasone (FLONASE) 50 MCG/ACT nasal spray USE 2 SPRAYS IN EACH NOSTRIL ONCE A DAY TO PREVENT NASAL ALLERGIES.  11  . furosemide (LASIX) 40 MG tablet TAKE ONE TABLET BY MOUTH DAILY IF NEEDED FOR FLUID. 90 tablet 1  . KLOR-CON M20 20 MEQ tablet TAKE 1 TABLET BY MOUTH EVERY DAY 90 tablet 3  . lisinopril (PRINIVIL,ZESTRIL) 2.5 MG tablet TAKE 1 TABLET (2.5 MG TOTAL) BY MOUTH DAILY. 90 tablet 1  . metoprolol tartrate (LOPRESSOR) 25 MG tablet Take 0.5 tablets (12.5 mg total) by mouth daily. 15 tablet 3  . Multiple Vitamin (MULTIVITAMIN WITH MINERALS) TABS tablet Take 1 tablet by mouth daily.    . predniSONE (DELTASONE) 10 MG tablet Take 1 tablet (10 mg total) by mouth daily with breakfast. Take 6 tablets today and then decrease by 1 tablet daily until none are left. 21 tablet 0  . QVAR 80 MCG/ACT inhaler INHALE 2 PUFFS ONCE A DAY TO PREVENT BREATHING PROBLEMS.  11   No current facility-administered medications for this visit.      Past Surgical History:  Procedure Laterality Date  . DENTAL SURGERY       No Known Allergies    Family History  Problem Relation Age of Onset  . Hypertension Mother   . CVA Father   . Heart attack Brother      Social History Derrick [name] reports that he has quit smoking. His smoking use included cigarettes. His smokeless tobacco use includes chew. Derrick [name] reports that he does not drink  alcohol.   Review of Systems CONSTITUTIONAL: No weight loss, fever, chills, weakness or fatigue.  HEENT: Eyes: No visual loss, blurred vision, double vision or yellow sclerae.No hearing loss, sneezing, congestion, runny nose or sore throat.  SKIN: No rash or itching.  CARDIOVASCULAR: per hpi RESPIRATORY: per hpi GASTROINTESTINAL: No anorexia, nausea, vomiting or diarrhea. No abdominal pain or blood.  GENITOURINARY: No burning on urination, no polyuria NEUROLOGICAL: No headache, dizziness, syncope, paralysis, ataxia, numbness or tingling in the extremities. No change in bowel or bladder control.  MUSCULOSKELETAL: No muscle, back pain, joint pain or stiffness.  LYMPHATICS: No enlarged nodes. No history of splenectomy.  PSYCHIATRIC: No history of depression or anxiety.  ENDOCRINOLOGIC: No reports of sweating, cold or heat intolerance. No polyuria or polydipsia.  Marland Kitchen   Physical Examination Vitals:   12/03/17 1406  BP: 126/88  Pulse: 67  SpO2: (!) 89%   Vitals:   12/03/17 1406  Weight: 135 lb 12.8 oz (61.6 kg)  Height: 5' 9.5" (1.765 m)    Gen: resting comfortably, no acute distress HEENT: no scleral icterus, pupils equal round and reactive, no palptable cervical adenopathy,  CV: RRR, no m/r/,g no jvd Resp: wheezing bilaterally GI: abdomen is soft, non-tender, non-distended, normal bowel sounds, no hepatosplenomegaly MSK: extremities are warm, no edema.  Skin: warm, no rash Neuro:  no focal deficits Psych: appropriate affect   Diagnostic Studies  12/2016 Cardiac MRI FINDINGS: Both atria were normal in size. RV was normal in size and function. There was no ASD/VSD or pericardial effusion. The aortic root was mildly dilatated at 3.9 cm The LV was normal in size The septum was 15 mm in thickness with the posterior wall being 9 mm. There was no SAM or LVOT gradient. There was moderate appearing AR. The RV was normal in size and function. The quantitative EF was 61% (EDV 120  cc ESV 46 cc SV 74 cc) Delayed enhancement images showed poor nulling of the myocardium suggesting possibility of amyloid or infiltrative DCM. There was no scar.  Note poor quality study due to motion and ectopy Most sequences done with free breathing  IMPRESSION: 1) Moderate LVH septal thickness 15 mm compared to posterior wall 9 mm. No SAM or LVOT gradient. 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 Wall thickness was increased in a pattern of severe LVH. Systolic function was mildly reduced. The estimated ejection fraction was in the range of 45% to 50%. Diffuse hypokinesis. The study is not  technically sufficient to allow evaluation of LV diastolic function. - Aortic valve: There was moderate regurgitation. Valve area (VTI): 3.13 cm^2. Valve area (Vmax): 2.91 cm^2. Valve area (Vmean): 2.94 cm^2. - Mitral valve: There was mild regurgitation. - Left atrium: The atrium was mildly dilated. - Right atrium: The atrium was mildly dilated. - Atrial septum: No defect or patent foramen ovale was identified. - Pulmonary arteries: PA peak pressure: 62 mm Hg (S). - Impressions: Tech entered wrong estimate of CVP Estimated PA pressure only 62 mmHg.  Impressions:  - Tech entered wrong estimate of CVP Estimated PA pressure only 62 mmHg.    05/2017 echo Study Conclusions  - Left ventricle: The cavity size was normal. Wall thickness was increased in a pattern of  severe LVH. Systolic function was normal. The estimated ejection fraction was in the range of 60% to 65%. Wall motion was normal; there were no regional wall motion abnormalities. Diastolic dysfunction, grade indeterminate. Indeterminate filling pressures. - Aortic valve: Trileaflet; normal thickness leaflets. There was moderate regurgitation. - Aorta: Mild aortic root dilatation. Aortic root dimension: 42 mm (ED). - Mitral valve: There was mild regurgitation. - Right ventricle: The cavity size was moderately dilated. Systolic function was moderately reduced. - Right atrium: The atrium was moderately to severely dilated. - Atrial septum: No defect or patent foramen ovale was identified. - Tricuspid valve: There was mild-moderate regurgitation. - Pulmonary arteries: Systolic pressure was severely increased. PA peak pressure: 88 mm Hg (S).     Assessment and Plan  1. LVH/Mild LV systolic dysfunction/Chronic systolic HF - moderate asymmetric hypertrophy, MRI findings not consistent with hypertrophic CM. Pattern questionable for amyloid, SPEPand UPEP overall unremarkable.  - by repeat echo  normalized.  - euvolemic, continue current meds  2. Pulmonary HTN -severe by echo. History of COPD and rheumatoid arthtitis - I have strongly recommended RHC for patient, - he continues to refuse any further workup. I specifically talked about pulm HTN and risk of right heart failure in detail today in clinic.    3. COPD with acute exacerbation - +cough, SOB, wheezing. Hypoxic in clinic. He refuse ER evaliuation - will give 5 day course of prednisone and azithromycin. Encouraged increased compliance with inhalers. He is not interested in seeing pulmonary   F/u 6 months.         Antoine Poche, M.D.

## 2017-12-03 NOTE — Patient Instructions (Signed)
Medication Instructions:  Your physician has recommended you make the following change in your medication:    START Prednisone 40 mg daily for 5 days   START Azithromycin 500 mg for 1 day then 250 mg for the next 4 days   Please continue all other medication as prescribed   Labwork: NONE  Testing/Procedures: NONE  Follow-Up: Your physician wants you to follow-up in: 6 MONTHS WITH DR. BRANCH  You will receive a reminder letter in the mail two months in advance. If you don't receive a letter, please call our office to schedule the follow-up appointment.  Any Other Special Instructions Will Be Listed Below (If Applicable).  If you need a refill on your cardiac medications before your next appointment, please call your pharmacy.

## 2017-12-08 ENCOUNTER — Other Ambulatory Visit: Payer: Self-pay | Admitting: Cardiovascular Disease

## 2017-12-08 ENCOUNTER — Encounter: Payer: Self-pay | Admitting: Cardiology

## 2018-04-19 ENCOUNTER — Other Ambulatory Visit: Payer: Self-pay | Admitting: Cardiology

## 2018-05-06 ENCOUNTER — Other Ambulatory Visit: Payer: Self-pay | Admitting: Cardiology

## 2018-06-03 ENCOUNTER — Other Ambulatory Visit: Payer: Self-pay | Admitting: Cardiology

## 2018-06-04 ENCOUNTER — Other Ambulatory Visit: Payer: Self-pay | Admitting: Cardiology

## 2018-06-23 ENCOUNTER — Other Ambulatory Visit: Payer: Self-pay | Admitting: Cardiology

## 2018-07-21 ENCOUNTER — Other Ambulatory Visit: Payer: Self-pay | Admitting: Cardiology

## 2018-10-04 ENCOUNTER — Inpatient Hospital Stay (HOSPITAL_COMMUNITY)
Admission: EM | Admit: 2018-10-04 | Discharge: 2018-10-10 | DRG: 291 | Disposition: A | Discharge status: Home / Self Care | Payer: Medicare HMO | Attending: Family Medicine | Admitting: Family Medicine

## 2018-10-04 ENCOUNTER — Emergency Department (HOSPITAL_COMMUNITY): Payer: Medicare HMO

## 2018-10-04 ENCOUNTER — Encounter (HOSPITAL_COMMUNITY): Payer: Self-pay | Admitting: Emergency Medicine

## 2018-10-04 ENCOUNTER — Inpatient Hospital Stay (HOSPITAL_COMMUNITY): Payer: Medicare HMO

## 2018-10-04 ENCOUNTER — Other Ambulatory Visit: Payer: Self-pay

## 2018-10-04 DIAGNOSIS — L03116 Cellulitis of left lower limb: Secondary | ICD-10-CM | POA: Diagnosis present

## 2018-10-04 DIAGNOSIS — I472 Ventricular tachycardia: Secondary | ICD-10-CM | POA: Diagnosis not present

## 2018-10-04 DIAGNOSIS — J9601 Acute respiratory failure with hypoxia: Secondary | ICD-10-CM | POA: Diagnosis not present

## 2018-10-04 DIAGNOSIS — I110 Hypertensive heart disease with heart failure: Principal | ICD-10-CM | POA: Diagnosis present

## 2018-10-04 DIAGNOSIS — I509 Heart failure, unspecified: Secondary | ICD-10-CM

## 2018-10-04 DIAGNOSIS — J441 Chronic obstructive pulmonary disease with (acute) exacerbation: Secondary | ICD-10-CM | POA: Diagnosis present

## 2018-10-04 DIAGNOSIS — E876 Hypokalemia: Secondary | ICD-10-CM | POA: Diagnosis not present

## 2018-10-04 DIAGNOSIS — D696 Thrombocytopenia, unspecified: Secondary | ICD-10-CM | POA: Diagnosis present

## 2018-10-04 DIAGNOSIS — E875 Hyperkalemia: Secondary | ICD-10-CM | POA: Diagnosis present

## 2018-10-04 DIAGNOSIS — I5033 Acute on chronic diastolic (congestive) heart failure: Secondary | ICD-10-CM | POA: Diagnosis present

## 2018-10-04 DIAGNOSIS — Z823 Family history of stroke: Secondary | ICD-10-CM | POA: Diagnosis not present

## 2018-10-04 DIAGNOSIS — R0902 Hypoxemia: Secondary | ICD-10-CM | POA: Diagnosis not present

## 2018-10-04 DIAGNOSIS — I272 Pulmonary hypertension, unspecified: Secondary | ICD-10-CM | POA: Diagnosis present

## 2018-10-04 DIAGNOSIS — I340 Nonrheumatic mitral (valve) insufficiency: Secondary | ICD-10-CM | POA: Diagnosis not present

## 2018-10-04 DIAGNOSIS — Z833 Family history of diabetes mellitus: Secondary | ICD-10-CM

## 2018-10-04 DIAGNOSIS — Z87891 Personal history of nicotine dependence: Secondary | ICD-10-CM | POA: Diagnosis not present

## 2018-10-04 DIAGNOSIS — L0390 Cellulitis, unspecified: Secondary | ICD-10-CM

## 2018-10-04 DIAGNOSIS — Z23 Encounter for immunization: Secondary | ICD-10-CM | POA: Diagnosis present

## 2018-10-04 DIAGNOSIS — I361 Nonrheumatic tricuspid (valve) insufficiency: Secondary | ICD-10-CM

## 2018-10-04 DIAGNOSIS — T502X5A Adverse effect of carbonic-anhydrase inhibitors, benzothiadiazides and other diuretics, initial encounter: Secondary | ICD-10-CM | POA: Diagnosis not present

## 2018-10-04 DIAGNOSIS — Z8249 Family history of ischemic heart disease and other diseases of the circulatory system: Secondary | ICD-10-CM

## 2018-10-04 DIAGNOSIS — I351 Nonrheumatic aortic (valve) insufficiency: Secondary | ICD-10-CM | POA: Diagnosis not present

## 2018-10-04 HISTORY — DX: Heart failure, unspecified: I50.9

## 2018-10-04 LAB — COMPREHENSIVE METABOLIC PANEL
ALT: 31 U/L (ref 0–44)
AST: 34 U/L (ref 15–41)
Albumin: 3.4 g/dL — ABNORMAL LOW (ref 3.5–5.0)
Alkaline Phosphatase: 59 U/L (ref 38–126)
Anion gap: 9 (ref 5–15)
BILIRUBIN TOTAL: 2 mg/dL — AB (ref 0.3–1.2)
BUN: 38 mg/dL — AB (ref 8–23)
CO2: 31 mmol/L (ref 22–32)
Calcium: 8.3 mg/dL — ABNORMAL LOW (ref 8.9–10.3)
Chloride: 99 mmol/L (ref 98–111)
Creatinine, Ser: 1.12 mg/dL (ref 0.61–1.24)
Glucose, Bld: 94 mg/dL (ref 70–99)
Potassium: 4.8 mmol/L (ref 3.5–5.1)
Sodium: 139 mmol/L (ref 135–145)
Total Protein: 6 g/dL — ABNORMAL LOW (ref 6.5–8.1)

## 2018-10-04 LAB — CBC WITH DIFFERENTIAL/PLATELET
Abs Immature Granulocytes: 0.04 10*3/uL (ref 0.00–0.07)
Basophils Absolute: 0 10*3/uL (ref 0.0–0.1)
Basophils Relative: 0 %
Eosinophils Absolute: 0 10*3/uL (ref 0.0–0.5)
Eosinophils Relative: 0 %
HCT: 56.1 % — ABNORMAL HIGH (ref 39.0–52.0)
Hemoglobin: 17.4 g/dL — ABNORMAL HIGH (ref 13.0–17.0)
Immature Granulocytes: 1 %
Lymphocytes Relative: 4 %
Lymphs Abs: 0.3 10*3/uL — ABNORMAL LOW (ref 0.7–4.0)
MCH: 32.2 pg (ref 26.0–34.0)
MCHC: 31 g/dL (ref 30.0–36.0)
MCV: 103.7 fL — ABNORMAL HIGH (ref 80.0–100.0)
Monocytes Absolute: 0.6 10*3/uL (ref 0.1–1.0)
Monocytes Relative: 7 %
NEUTROS ABS: 7.2 10*3/uL (ref 1.7–7.7)
Neutrophils Relative %: 88 %
Platelets: 101 10*3/uL — ABNORMAL LOW (ref 150–400)
RBC: 5.41 MIL/uL (ref 4.22–5.81)
RDW: 14.3 % (ref 11.5–15.5)
WBC: 8.1 10*3/uL (ref 4.0–10.5)
nRBC: 0 % (ref 0.0–0.2)

## 2018-10-04 LAB — ECHOCARDIOGRAM COMPLETE
Height: 71 in
WEIGHTICAEL: 2240 [oz_av]

## 2018-10-04 LAB — BRAIN NATRIURETIC PEPTIDE: B Natriuretic Peptide: 1874 pg/mL — ABNORMAL HIGH (ref 0.0–100.0)

## 2018-10-04 LAB — TROPONIN I: Troponin I: 0.09 ng/mL (ref <0.03)

## 2018-10-04 MED ORDER — CARVEDILOL 3.125 MG PO TABS
3.1250 mg | ORAL_TABLET | Freq: Two times a day (BID) | ORAL | Status: DC
  Administered 2018-10-04 (×2): 3.125 mg via ORAL
  Filled 2018-10-04 (×8): qty 1

## 2018-10-04 MED ORDER — ACETAMINOPHEN 650 MG RE SUPP: 650.0000 mg | Freq: Four times a day (QID) | RECTAL | Status: DC | PRN

## 2018-10-04 MED ORDER — HYDROCODONE-ACETAMINOPHEN 5-325 MG PO TABS
1.0000 | ORAL_TABLET | ORAL | Status: DC | PRN
  Administered 2018-10-04 (×2): 1 via ORAL
  Filled 2018-10-04 (×2): qty 1

## 2018-10-04 MED ORDER — POTASSIUM CHLORIDE CRYS ER 20 MEQ PO TBCR
20.0000 meq | EXTENDED_RELEASE_TABLET | Freq: Every day | ORAL | Status: DC
  Administered 2018-10-04: 20 meq via ORAL
  Filled 2018-10-04: qty 1

## 2018-10-04 MED ORDER — CEFAZOLIN SODIUM-DEXTROSE 1-4 GM/50ML-% IV SOLN
1.0000 g | Freq: Once | INTRAVENOUS | Status: AC
  Administered 2018-10-04: 1 g via INTRAVENOUS
  Filled 2018-10-04: qty 50

## 2018-10-04 MED ORDER — SODIUM CHLORIDE 0.9% FLUSH: 3.0000 mL | INTRAVENOUS | Status: DC | PRN

## 2018-10-04 MED ORDER — IPRATROPIUM-ALBUTEROL 0.5-2.5 (3) MG/3ML IN SOLN: 3.0000 mL | Freq: Once | RESPIRATORY_TRACT | Status: DC

## 2018-10-04 MED ORDER — SODIUM CHLORIDE 0.9 % IV SOLN: 250.0000 mL | INTRAVENOUS | Status: DC | PRN

## 2018-10-04 MED ORDER — FUROSEMIDE 10 MG/ML IJ SOLN
40.0000 mg | Freq: Once | INTRAMUSCULAR | Status: AC
  Administered 2018-10-04: 40 mg via INTRAVENOUS
  Filled 2018-10-04: qty 4

## 2018-10-04 MED ORDER — FUROSEMIDE 10 MG/ML IJ SOLN
40.0000 mg | Freq: Two times a day (BID) | INTRAMUSCULAR | Status: DC
  Administered 2018-10-04 – 2018-10-10 (×12): 40 mg via INTRAVENOUS
  Filled 2018-10-04 (×12): qty 4

## 2018-10-04 MED ORDER — IPRATROPIUM-ALBUTEROL 0.5-2.5 (3) MG/3ML IN SOLN: 3.0000 mL | Freq: Four times a day (QID) | RESPIRATORY_TRACT | Status: DC | PRN

## 2018-10-04 MED ORDER — FLUTICASONE PROPIONATE 50 MCG/ACT NA SUSP
2.0000 | Freq: Every day | NASAL | Status: DC
  Administered 2018-10-04 – 2018-10-10 (×7): 2 via NASAL
  Filled 2018-10-04 (×2): qty 16

## 2018-10-04 MED ORDER — INFLUENZA VAC SPLIT HIGH-DOSE 0.5 ML IM SUSY
0.5000 mL | PREFILLED_SYRINGE | INTRAMUSCULAR | Status: AC
  Administered 2018-10-05: 0.5 mL via INTRAMUSCULAR
  Filled 2018-10-04: qty 0.5

## 2018-10-04 MED ORDER — ONDANSETRON HCL 4 MG/2ML IJ SOLN: 4.0000 mg | Freq: Four times a day (QID) | INTRAMUSCULAR | Status: DC | PRN

## 2018-10-04 MED ORDER — ONDANSETRON HCL 4 MG PO TABS: 4.0000 mg | ORAL_TABLET | Freq: Four times a day (QID) | ORAL | Status: DC | PRN

## 2018-10-04 MED ORDER — CEFAZOLIN SODIUM-DEXTROSE 2-4 GM/100ML-% IV SOLN
2.0000 g | Freq: Three times a day (TID) | INTRAVENOUS | Status: DC
  Administered 2018-10-04 – 2018-10-07 (×8): 2 g via INTRAVENOUS
  Filled 2018-10-04 (×17): qty 100

## 2018-10-04 MED ORDER — IPRATROPIUM BROMIDE 0.02 % IN SOLN
1.0000 mg | Freq: Once | RESPIRATORY_TRACT | Status: AC
  Administered 2018-10-04: 1 mg via RESPIRATORY_TRACT
  Filled 2018-10-04: qty 5

## 2018-10-04 MED ORDER — HEPARIN SODIUM (PORCINE) 5000 UNIT/ML IJ SOLN
5000.0000 [IU] | Freq: Three times a day (TID) | INTRAMUSCULAR | Status: DC
  Administered 2018-10-04 – 2018-10-10 (×19): 5000 [IU] via SUBCUTANEOUS
  Filled 2018-10-04 (×18): qty 1

## 2018-10-04 MED ORDER — ACETAMINOPHEN 325 MG PO TABS: 650.0000 mg | ORAL_TABLET | Freq: Four times a day (QID) | ORAL | Status: DC | PRN

## 2018-10-04 MED ORDER — SODIUM CHLORIDE 0.9% FLUSH
3.0000 mL | Freq: Two times a day (BID) | INTRAVENOUS | Status: DC
  Administered 2018-10-04 – 2018-10-10 (×13): 3 mL via INTRAVENOUS

## 2018-10-04 MED ORDER — ALBUTEROL (5 MG/ML) CONTINUOUS INHALATION SOLN
10.0000 mg/h | INHALATION_SOLUTION | Freq: Once | RESPIRATORY_TRACT | Status: AC
  Administered 2018-10-04: 10 mg/h via RESPIRATORY_TRACT
  Filled 2018-10-04: qty 20

## 2018-10-04 MED ORDER — PNEUMOCOCCAL VAC POLYVALENT 25 MCG/0.5ML IJ INJ
0.5000 mL | INJECTION | INTRAMUSCULAR | Status: AC
  Administered 2018-10-05: 0.5 mL via INTRAMUSCULAR
  Filled 2018-10-04: qty 0.5

## 2018-10-04 NOTE — ED Notes (Signed)
Placed on hospital bed

## 2018-10-04 NOTE — Progress Notes (Signed)
Pharmacy Antibiotic Note  Derrick [name] is a 68 y.o. male admitted on 10/04/2018 with cellulitis.  Pharmacy has been consulted for cefazolin dosing.  Plan: cefazolin 2gm iv q8h  Height: 5\' 11"  (180.3 cm) Weight: 140 lb (63.5 kg) IBW/kg (Calculated) : 75.3  Temp (24hrs), Avg:97.6 F (36.4 C), Min:97.6 F (36.4 C), Max:97.6 F (36.4 C)  Recent Labs  Lab 10/04/18 1211  WBC 8.1  CREATININE 1.12    Estimated Creatinine Clearance: 59.1 mL/min (by C-G formula based on SCr of 1.12 mg/dL).    No Known Allergies  Antimicrobials this admission: 2/28 cefazolin >>    Thank you for allowing pharmacy to be a part of this patient's care.  Gerre Pebbles Zackary Mckeone 10/04/2018 3:14 PM

## 2018-10-04 NOTE — ED Notes (Signed)
Bilaterally edema to legs, left >right.  Left leg with redness and oozing.  Toes to right foot purple but blanches.

## 2018-10-04 NOTE — ED Triage Notes (Addendum)
Patient reports L leg swelling that started 1 week ago.  Leg swelling noted bilat. Redness and weeping to L lower leg. Patient with increased WOB, nail bed cyanosis noted.

## 2018-10-04 NOTE — Progress Notes (Signed)
*  PRELIMINARY RESULTS* Echocardiogram 2D Echocardiogram has been performed.  Stacey Drain 10/04/2018, 4:05 PM

## 2018-10-04 NOTE — ED Notes (Signed)
ECHO complete

## 2018-10-04 NOTE — H&P (Signed)
History and Physical    Derrick Lynchhomas W Lundblad ZOX:[id number] DOB: 11/14/1950 DOA: 10/04/2018  PCP: Gareth MorganKnowlton, Steve, MD   Patient coming from: Home  Chief Complaint: Bilateral lower extremity edema and pain with dyspnea  HPI: Derrick [name] is a 68 y.o. male with medical history significant for chronic diastolic heart failure, COPD, and hypertension who presented to the ED with worsening bilateral lower extremity edema over the past 1 week as well as weight gain.  He states he has been taking his home furosemide with no improvement.  He started to become short of breath and has also noted some erythema and warmth to his left foot.  He states that he probably wear a sock "too tight" and this may have caused the problem.  He denies any chest pain, cough, fevers, chills, abdominal pain, nausea, or vomiting.   ED Course: Vital signs are stable, but patient was noted to have oxygen saturation of 81% on room air for which she is now on 2 L nasal cannula with some improvement.  Laboratory data unremarkable aside from some thrombocytopenia with platelet count of 101.  Troponin of 0.09 and noted baseline elevation from previous.  Ultrasound of lower extremities performed with no DVTs noted.  BNP of 1874.  Chest x-ray with findings of COPD and some mild cardiomegaly and pulmonary edema.  Patient has been given albuterol neb treatment as well as IV Lasix and has been started on IV Ancef for cellulitis.  Review of Systems: All others reviewed and otherwise negative.  Past Medical History:  Diagnosis Date  . Allergic rhinitis due to pollen 09/08/2009  . CHF (congestive heart failure) (HCC)   . COPD (chronic obstructive pulmonary disease) (HCC) 03/14/2013  . 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 Family history of diabetes mellitus 09/08/2009   father  . Family history of stroke (cerebrovascular) 09/08/2009   father  . Nicotine dependence, chewing tobacco, uncomplicated 08/07/1968  .  Other disturbances of skin sensation 11/08/2012  . Perennial allergic rhinitis with seasonal variation 11/08/2012  . RA (rheumatoid arthritis) (HCC) 09/08/2009    Past Surgical History:  Procedure Laterality Date  . DENTAL SURGERY       reports that he has quit smoking. His smoking use included cigarettes. His smokeless tobacco use includes chew. He reports that he does not drink alcohol or use drugs.  No Known Allergies  Family History  Problem Relation Age of Onset  . Hypertension Mother   . CVA Father   . Heart attack Brother     Prior to Admission medications   Medication Sig Start Date End Date Taking? Authorizing Provider  albuterol (PROVENTIL) (2.5 MG/3ML) 0.083% nebulizer solution INHALATION USE ONE AMPULE IN YOUR NEBULIZER UP TO 4 TIMES A DAY FOR BREATHING. 06/02/16  Yes [provider]  carvedilol (COREG) 3.125 MG tablet TAKE 1 TABLET (3.125 MG TOTAL) BY MOUTH 2 (TWO) TIMES DAILY. 07/22/18 10/20/18 Yes Branch, Dorothe PeaJonathan F, MD  [facility] VITAMIN B12 1000 MCG tablet TAKE 2 TABLETS EACH DAY FOR VITAMIN B12 DEFICIENCY. 05/29/16  Yes [provider]  fluticasone (FLONASE) 50 MCG/ACT nasal spray USE 2 SPRAYS IN EACH NOSTRIL ONCE A DAY TO PREVENT NASAL ALLERGIES. 05/29/16  Yes [provider]  furosemide (LASIX) 40 MG tablet TAKE ONE TABLET BY MOUTH DAILY IF NEEDED FOR FLUID. 06/04/18  Yes Branch, Dorothe PeaJonathan F, MD  KLOR-CON M20 20 MEQ tablet TAKE 1 TABLET BY MOUTH EVERY DAY 06/24/18  Yes Branch, Dorothe PeaJonathan F, MD  lisinopril (PRINIVIL,ZESTRIL) 2.5 MG tablet TAKE 1 TABLET (2.5 MG TOTAL) BY MOUTH DAILY. 12/10/17 10/04/18 Yes Wendall Stade, MD  albuterol (PROVENTIL HFA;VENTOLIN HFA) 108 (90 Base) MCG/ACT inhaler Inhale 1-2 puffs into the lungs every 6 (six) hours as needed for wheezing or shortness of breath. Patient not taking: Reported on 10/04/2018 07/08/17   Philip Aspen, Limmie Patricia, MD  metoprolol tartrate (LOPRESSOR) 25 MG tablet Take 0.5 tablets (12.5 mg total) by  mouth daily. Patient not taking: Reported on 12/03/2017 07/08/17 07/08/18  Philip Aspen, Limmie Patricia, MD  QVAR 80 MCG/ACT inhaler INHALE 2 PUFFS ONCE A DAY TO PREVENT BREATHING PROBLEMS. 05/30/16   [provider]    Physical Exam: Vitals:   10/04/18 1230 10/04/18 1330 10/04/18 1340 10/04/18 1400  BP: 110/83 109/71  105/79  Pulse:  63  69  Resp: 19 17  15   Temp:   97.6 F (36.4 C)   TempSrc:   Oral   SpO2:  100%  99%  Weight:      Height:        Constitutional: NAD, calm, comfortable Vitals:   10/04/18 1230 10/04/18 1330 10/04/18 1340 10/04/18 1400  BP: 110/83 109/71  105/79  Pulse:  63  69  Resp: 19 17  15   Temp:   97.6 F (36.4 C)   TempSrc:   Oral   SpO2:  100%  99%  Weight:      Height:       Eyes: lids and conjunctivae normal ENMT: Mucous membranes are moist.  Poor dentition. Neck: normal, supple Respiratory: clear to auscultation bilaterally. Normal respiratory effort. No accessory muscle use.  Currently on 2 L nasal cannula. Cardiovascular: Regular rate and rhythm, no murmurs. No extremity edema. Abdomen: no tenderness, no distention. Bowel sounds positive.  Musculoskeletal: Bilateral lower extremity edema 1-2+ pitting.  Left mid shin to ankle with erythema and warmth along with some mild tenderness to palpation. Skin: no rashes, lesions, ulcers.  Psychiatric: Normal judgment and insight. Alert and oriented x 3. Normal mood.   Labs on Admission: I have personally reviewed following labs and imaging studies  CBC: Recent Labs  Lab 10/04/18 1211  WBC 8.1  NEUTROABS 7.2  HGB 17.4*  HCT 56.1*  MCV 103.7*  PLT 101*   Basic Metabolic Panel: Recent Labs  Lab 10/04/18 1211  NA 139  K 4.8  CL 99  CO2 31  GLUCOSE 94  BUN 38*  CREATININE 1.12  CALCIUM 8.3*   GFR: Estimated Creatinine Clearance: 59.1 mL/min (by C-G formula based on SCr of 1.12 mg/dL). Liver Function Tests: Recent Labs  Lab 10/04/18 1211  AST 34  ALT 31  ALKPHOS 59  BILITOT  2.0*  PROT 6.0*  ALBUMIN 3.4*   No results for input(s): LIPASE, AMYLASE in the last 168 hours. No results for input(s): AMMONIA in the last 168 hours. Coagulation Profile: No results for input(s): INR, PROTIME in the last 168 hours. Cardiac Enzymes: Recent Labs  Lab 10/04/18 1129  TROPONINI 0.09*   BNP (last 3 results) No results for input(s): PROBNP in the last 8760 hours. HbA1C: No results for input(s): HGBA1C in the last 72 hours. CBG: No results for input(s): GLUCAP in the last 168 hours. Lipid Profile: No results for input(s): CHOL, HDL, LDLCALC, TRIG, CHOLHDL, LDLDIRECT in the last 72 hours. Thyroid Function Tests: No results for input(s): TSH, T4TOTAL, FREET4, T3FREE, THYROIDAB in the last 72 hours. Anemia Panel: No results for input(s): VITAMINB12, FOLATE, FERRITIN, TIBC, IRON, RETICCTPCT  in the last 72 hours. Urine analysis: No results found for: COLORURINE, APPEARANCEUR, LABSPEC, PHURINE, GLUCOSEU, HGBUR, BILIRUBINUR, KETONESUR, PROTEINUR, UROBILINOGEN, NITRITE, LEUKOCYTESUR  Radiological Exams on Admission: US Venous Img Lower Bilateral  Result Date: 10/04/2018 CLINICAL DATA:  Bilateral leg swelling. EXAM: BILATERAL LOWER EXTREMITY VENOUS DOPPLER ULTRASOUND TECHNIQUE: Gray-scale sonography with graded compression, as well as color Doppler and duplex ultrasound were performed to evaluate the lower extremity deep venous systems from the level of the common femoral vein and including the common femoral, femoral, profunda femoral, popliteal and calf veins including the posterior tibial, peroneal and gastrocnemius veins when visible. The superficial great saphenous vein was also interrogated. Spectral Doppler was utilized to evaluate flow at rest and with distal augmentation maneuvers in the common femoral, femoral and popliteal veins. COMPARISON:  None. FINDINGS: RIGHT LOWER EXTREMITY Common Femoral Vein: No evidence of thrombus. Normal compressibility, respiratory phasicity  and response to augmentation. Saphenofemoral Junction: No evidence of thrombus. Normal compressibility and flow on color Doppler imaging. Profunda Femoral Vein: No evidence of thrombus. Normal compressibility and flow on color Doppler imaging. Femoral Vein: No evidence of thrombus. Normal compressibility, respiratory phasicity and response to augmentation. Popliteal Vein: No evidence of thrombus. Normal compressibility, respiratory phasicity and response to augmentation. Calf Veins: Visualized right deep calf veins are patent without thrombus. Other Findings:  Subcutaneous edema in the right lower extremity. LEFT LOWER EXTREMITY Common Femoral Vein: No evidence of thrombus. Normal compressibility, respiratory phasicity and response to augmentation. Saphenofemoral Junction: No evidence of thrombus. Normal compressibility and flow on color Doppler imaging. Profunda Femoral Vein: No evidence of thrombus. Normal compressibility and flow on color Doppler imaging. Femoral Vein: No evidence of thrombus. Normal compressibility, respiratory phasicity and response to augmentation. Popliteal Vein: No evidence of thrombus. Normal compressibility, respiratory phasicity and response to augmentation. Calf Veins: Visualized left deep calf veins are patent without thrombus. Superficial Great Saphenous Vein: No evidence of thrombus. Normal compressibility. Other Findings:  Subcutaneous edema. IMPRESSION: No evidence of deep venous thrombosis in the lower extremities. Electronically Signed   By: Richarda Overlie M.D.   On: 10/04/2018 12:21   Dg Chest Port 1 View  Result Date: 10/04/2018 CLINICAL DATA:  Bilateral leg swelling. Left lower leg redness and exudate. EXAM: PORTABLE CHEST 1 VIEW COMPARISON:  07/06/2017. FINDINGS: Stable enlarged cardiac silhouette. Mild decrease in prominence of probable central pulmonary arteries on the right. The upper lung zone pulmonary vasculature remains mildly prominent bilateral upper lobe bullous  changes. Unremarkable bones. IMPRESSION: 1. Stable cardiomegaly and mild pulmonary vascular congestion. 2. Mildly decreased prominence of probable central pulmonary arteries on the right. 3. Stable changes of COPD. Electronically Signed   By: Beckie Salts M.D.   On: 10/04/2018 13:29    EKG: Independently reviewed.  Sinus rhythm 77 bpm with right axis deviation.  Assessment/Plan Principal Problem:   Acute hypoxemic respiratory failure (HCC) Active Problems:   Pulmonary HTN (HCC)   Cellulitis   Acute on chronic diastolic CHF (congestive heart failure) (HCC)    1. Acute hypoxemic respiratory failure secondary to volume overload with suspected acute on chronic diastolic congestive heart failure exacerbation.  Continue on Lasix IV twice daily for diuresis and monitor weights as well as intake and output.  Will wean oxygen as tolerated.  Check 2D echocardiogram as prior was performed on 05/2017 with LVEF of 60 to 65% and indeterminate diastolic dysfunction at that time.  Continue on beta-blocker for now and monitor blood pressures. 2. Left lower extremity cellulitis.  Continue on  IV Ancef for now and monitor for improvement.  Will convert to oral antibiotics by a.m. if improved. 3. History of COPD.  Maintain on duo nebs as needed.  Patient does not appear to have any acute bronchospasms or wheezing currently noted.   DVT prophylaxis: Heparin Code Status: Full Family Communication: None at bedside  Disposition Plan: Admit for diuresis and CHF evaluation as well as treatment of cellulitis Consults called: None Admission status: Inpatient, telemetry   Pratik Hoover BrunetteD Shah DO [facility] Pager [phone]  If 7PM-7AM, please contact night-coverage [email or web address] Password [email or web address]  10/04/2018, 2:48 PM

## 2018-10-04 NOTE — ED Provider Notes (Signed)
[facility] EMERGENCY DEPARTMENT Provider Note   CSN: [id number] Arrival date & time: 10/04/18  1049    History   Chief Complaint Chief Complaint  Patient presents with  . Leg Swelling    HPI Derrick [name] is a 68 y.o. male.     HPI  Pt was seen at 1120. Per pt, c/o gradual onset and worsening of persistent SOB and pedal edema for the past 1 week. Endorses hx COPD and CHF. States he "had a sock on my left foot and think that's why it got red." Denies CP/palpitations, no cough, no fevers, no abd pain, no N/V/D, no back pain, no focal motor weakness, no tingling/numbness in extremities.   Past Medical History:  Diagnosis Date  . Allergic rhinitis due to pollen 09/08/2009  . CHF (congestive heart failure) (HCC)   . COPD (chronic obstructive pulmonary disease) (HCC) 03/14/2013  . 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 Family history of diabetes mellitus 09/08/2009   father  . Family history of stroke (cerebrovascular) 09/08/2009   father  . Nicotine dependence, chewing tobacco, uncomplicated 08/07/1968  . Other disturbances of skin sensation 11/08/2012  . Perennial allergic rhinitis with seasonal variation 11/08/2012  . RA (rheumatoid arthritis) (HCC) 09/08/2009    Patient Active Problem List   Diagnosis Date Noted  . NSVT (nonsustained ventricular tachycardia) (HCC) 07/08/2017  . Pulmonary HTN (HCC) 07/08/2017  . COPD exacerbation (HCC) 07/06/2017    Past Surgical History:  Procedure Laterality Date  . DENTAL SURGERY         Home Medications    Prior to Admission medications   Medication Sig Start Date End Date Taking? Authorizing Provider  albuterol (PROVENTIL HFA;VENTOLIN HFA) 108 (90 Base) MCG/ACT inhaler Inhale 1-2 puffs into the lungs every 6 (six) hours as needed for wheezing or shortness of breath. 07/08/17   Philip Aspen, Limmie Patricia, MD  albuterol (PROVENTIL) (2.5 MG/3ML) 0.083% nebulizer solution INHALATION USE ONE AMPULE  IN YOUR NEBULIZER UP TO 4 TIMES A DAY FOR BREATHING. 06/02/16   [provider]  carvedilol (COREG) 3.125 MG tablet TAKE 1 TABLET (3.125 MG TOTAL) BY MOUTH 2 (TWO) TIMES DAILY. 07/22/18 10/20/18  Antoine Poche, MD  [employer] VITAMIN B12 1000 MCG tablet TAKE 2 TABLETS EACH DAY FOR VITAMIN B12 DEFICIENCY. 05/29/16   [provider]  fluticasone (FLONASE) 50 MCG/ACT nasal spray USE 2 SPRAYS IN EACH NOSTRIL ONCE A DAY TO PREVENT NASAL ALLERGIES. 05/29/16   [provider]  furosemide (LASIX) 40 MG tablet TAKE ONE TABLET BY MOUTH DAILY IF NEEDED FOR FLUID. 06/04/18   Antoine Poche, MD  KLOR-CON M20 20 MEQ tablet TAKE 1 TABLET BY MOUTH EVERY DAY 06/24/18   Antoine Poche, MD  lisinopril (PRINIVIL,ZESTRIL) 2.5 MG tablet TAKE 1 TABLET (2.5 MG TOTAL) BY MOUTH DAILY. 12/10/17 03/10/18  Wendall Stade, MD  metoprolol tartrate (LOPRESSOR) 25 MG tablet Take 0.5 tablets (12.5 mg total) by mouth daily. Patient not taking: Reported on 12/03/2017 07/08/17 07/08/18  Philip Aspen, Limmie Patricia, MD  Multiple Vitamin (MULTIVITAMIN WITH MINERALS) TABS tablet Take 1 tablet by mouth daily.    [provider]  QVAR 80 MCG/ACT inhaler INHALE 2 PUFFS ONCE A DAY TO PREVENT BREATHING PROBLEMS. 05/30/16   [provider]    Family History Family History  Problem Relation Age of Onset  . Hypertension Mother   . CVA Father   . Heart attack Brother     Social History  Social History   Tobacco Use  . Smoking status: Former Smoker    Types: Cigarettes  . Smokeless tobacco: Current User    Types: Chew  . Tobacco comment: has chewed tobacco for 40+ years  Substance Use Topics  . Alcohol use: No  . Drug use: No     Allergies   Patient has no known allergies.   Review of Systems Review of Systems ROS: Statement: All systems negative except as marked or noted in the HPI; Constitutional: Negative for fever and chills. ; ; Eyes: Negative for eye pain, redness and  discharge. ; ; ENMT: Negative for ear pain, hoarseness, nasal congestion, sinus pressure and sore throat. ; ; Cardiovascular: Negative for chest pain, palpitations, diaphoresis, +dyspnea and peripheral edema. ; ; Respiratory: Negative for cough, wheezing and stridor. ; ; Gastrointestinal: Negative for nausea, vomiting, diarrhea, abdominal pain, blood in stool, hematemesis, jaundice and rectal bleeding. . ; ; Genitourinary: Negative for dysuria, flank pain and hematuria. ; ; Musculoskeletal: Negative for back pain and neck pain. Negative for swelling and trauma.; ; Skin: +rash. Negative for pruritus, abrasions, blisters, bruising and skin lesion.; ; Neuro: Negative for headache, lightheadedness and neck stiffness. Negative for weakness, altered level of consciousness, altered mental status, extremity weakness, paresthesias, involuntary movement, seizure and syncope.       Physical Exam Updated Vital Signs BP 104/67 (BP Location: Right Arm)   Pulse 91   Temp 97.6 F (36.4 C) (Oral)   Resp 20   Ht 5\' 11"  (1.803 m)   Wt 63.5 kg   SpO2 (!) 81%   BMI 19.53 kg/m   Patient Vitals for the past 24 hrs:  BP Temp Temp src Pulse Resp SpO2 Height Weight  10/04/18 1340 - 97.6 F (36.4 C) Oral - - - - -  10/04/18 1330 109/71 - - 63 17 100 % - -  10/04/18 1230 110/83 - - - 19 - - -  10/04/18 1215 - - - - (!) 21 - - -  10/04/18 1211 - - - - - 96 % - -  10/04/18 1200 101/75 - - - 15 - - -  10/04/18 1145 - - - - 18 - - -  10/04/18 1130 116/77 - - - (!) 25 - - -  10/04/18 1116 - - - - - - 5\' 11"  (1.803 m) 63.5 kg  10/04/18 1107 104/67 97.6 F (36.4 C) Oral 91 20 (!) 81 % - -      Physical Exam 1125: Physical examination:  Nursing notes reviewed; Vital signs and O2 SAT reviewed;  Constitutional: Well developed, Well nourished, Well hydrated, In no acute distress; Head:  Normocephalic, atraumatic; Eyes: EOMI, PERRL, No scleral icterus; ENMT: Mouth and pharynx normal, Mucous membranes moist; Neck:  Supple, Full range of motion, No lymphadenopathy; Cardiovascular: Regular rate and rhythm, No gallop; Respiratory: Breath sounds coarse & equal bilaterally, No wheezes.  Speaking full sentences with ease, Normal respiratory effort/excursion; Chest: Nontender, Movement normal; Abdomen: Soft, Nontender, Nondistended, Normal bowel sounds; Genitourinary: No CVA tenderness; Extremities: Peripheral pulses normal, No tenderness, +3 edema LLE feet to knee with oozing clear fluid, and erythema from foot to mid-tibia, +2 edema RLE foot to knee. +palp pedal pulses.; Neuro: AA&Ox3, Major CN grossly intact.  Speech clear. No gross focal motor or sensory deficits in extremities.; Skin: Color normal, Warm, Dry.   ED Treatments / Results  Labs (all labs ordered are listed, but only abnormal results are displayed)   EKG EKG Interpretation  Date/Time:  Friday October 04 2018 11:25:00 EST Ventricular Rate:  91 PR Interval:    QRS Duration: 98 QT Interval:  387 QTC Calculation: 458 R Axis:   -160 Text Interpretation:  Sinus rhythm Atrial premature complexes Probable left atrial enlargement Anterolateral infarct, age indeterminate Baseline wander Artifact When compared with ECG of 07/06/2017 Nonspecific T wave abnormality Inferior leads is now Present Repeat tracings suggested Confirmed by Samuel Jester [phone]) on 10/04/2018 11:52:56 AM   Radiology   Procedures Procedures (including critical care time)  Medications Ordered in ED Medications  albuterol (PROVENTIL,VENTOLIN) solution continuous neb (has no administration in time range)  ipratropium (ATROVENT) nebulizer solution 1 mg (has no administration in time range)     Initial Impression / Assessment and Plan / ED Course  I have reviewed the triage vital signs and the nursing notes.  Pertinent labs & imaging results that were available during my care of the patient were reviewed by me and considered in my medical decision making (see chart for  details).     MDM Reviewed: previous chart, vitals and nursing note Reviewed previous: labs and ECG Interpretation: labs, ECG, x-ray and ultrasound Total time providing critical care: 30-74 minutes. This excludes time spent performing separately reportable procedures and services. Consults: admitting MD   CRITICAL CARE Performed by: Samuel Jester Total critical care time: 35 minutes Critical care time was exclusive of separately billable procedures and treating other patients. Critical care was necessary to treat or prevent imminent or life-threatening deterioration. Critical care was time spent personally by me on the following activities: development of treatment plan with patient and/or surrogate as well as nursing, discussions with consultants, evaluation of patient's response to treatment, examination of patient, obtaining history from patient or surrogate, ordering and performing treatments and interventions, ordering and review of laboratory studies, ordering and review of radiographic studies, pulse oximetry and re-evaluation of patient's condition.   Results for orders placed or performed during the hospital encounter of 10/04/18  Brain natriuretic peptide  Result Value Ref Range   B Natriuretic Peptide 1,874.0 (H) 0.0 - 100.0 pg/mL  Troponin I - Once  Result Value Ref Range   Troponin I 0.09 (HH) <0.03 ng/mL  Comprehensive metabolic panel  Result Value Ref Range   Sodium 139 135 - 145 mmol/L   Potassium 4.8 3.5 - 5.1 mmol/L   Chloride 99 98 - 111 mmol/L   CO2 31 22 - 32 mmol/L   Glucose, Bld 94 70 - 99 mg/dL   BUN 38 (H) 8 - 23 mg/dL   Creatinine, Ser 6.04 0.61 - 1.24 mg/dL   Calcium 8.3 (L) 8.9 - 10.3 mg/dL   Total Protein 6.0 (L) 6.5 - 8.1 g/dL   Albumin 3.4 (L) 3.5 - 5.0 g/dL   AST 34 15 - 41 U/L   ALT 31 0 - 44 U/L   Alkaline Phosphatase 59 38 - 126 U/L   Total Bilirubin 2.0 (H) 0.3 - 1.2 mg/dL   GFR calc non Af Amer >60 >60 mL/min   GFR calc Af Amer >60  >60 mL/min   Anion gap 9 5 - 15  CBC with Differential/Platelet  Result Value Ref Range   WBC 8.1 4.0 - 10.5 K/uL   RBC 5.41 4.22 - 5.81 MIL/uL   Hemoglobin 17.4 (H) 13.0 - 17.0 g/dL   HCT 54.0 (H) 98.1 - 19.1 %   MCV 103.7 (H) 80.0 - 100.0 fL   MCH 32.2 26.0 - 34.0 pg   MCHC 31.0  30.0 - 36.0 g/dL   RDW 16.114.3 09.611.5 - 04.515.5 %   Platelets 101 (L) 150 - 400 K/uL   nRBC 0.0 0.0 - 0.2 %   Neutrophils Relative % 88 %   Neutro Abs 7.2 1.7 - 7.7 K/uL   Lymphocytes Relative 4 %   Lymphs Abs 0.3 (L) 0.7 - 4.0 K/uL   Monocytes Relative 7 %   Monocytes Absolute 0.6 0.1 - 1.0 K/uL   Eosinophils Relative 0 %   Eosinophils Absolute 0.0 0.0 - 0.5 K/uL   Basophils Relative 0 %   Basophils Absolute 0.0 0.0 - 0.1 K/uL   Immature Granulocytes 1 %   Abs Immature Granulocytes 0.04 0.00 - 0.07 K/uL   [place] Venous Img Lower Bilateral Result Date: 10/04/2018 CLINICAL DATA:  Bilateral leg swelling. EXAM: BILATERAL LOWER EXTREMITY VENOUS DOPPLER ULTRASOUND TECHNIQUE: Gray-scale sonography with graded compression, as well as color Doppler and duplex ultrasound were performed to evaluate the lower extremity deep venous systems from the level of the common femoral vein and including the common femoral, femoral, profunda femoral, popliteal and calf veins including the posterior tibial, peroneal and gastrocnemius veins when visible. The superficial great saphenous vein was also interrogated. Spectral Doppler was utilized to evaluate flow at rest and with distal augmentation maneuvers in the common femoral, femoral and popliteal veins. COMPARISON:  None. FINDINGS: RIGHT LOWER EXTREMITY Common Femoral Vein: No evidence of thrombus. Normal compressibility, respiratory phasicity and response to augmentation. Saphenofemoral Junction: No evidence of thrombus. Normal compressibility and flow on color Doppler imaging. Profunda Femoral Vein: No evidence of thrombus. Normal compressibility and flow on color Doppler imaging. Femoral  Vein: No evidence of thrombus. Normal compressibility, respiratory phasicity and response to augmentation. Popliteal Vein: No evidence of thrombus. Normal compressibility, respiratory phasicity and response to augmentation. Calf Veins: Visualized right deep calf veins are patent without thrombus. Other Findings:  Subcutaneous edema in the right lower extremity. LEFT LOWER EXTREMITY Common Femoral Vein: No evidence of thrombus. Normal compressibility, respiratory phasicity and response to augmentation. Saphenofemoral Junction: No evidence of thrombus. Normal compressibility and flow on color Doppler imaging. Profunda Femoral Vein: No evidence of thrombus. Normal compressibility and flow on color Doppler imaging. Femoral Vein: No evidence of thrombus. Normal compressibility, respiratory phasicity and response to augmentation. Popliteal Vein: No evidence of thrombus. Normal compressibility, respiratory phasicity and response to augmentation. Calf Veins: Visualized left deep calf veins are patent without thrombus. Superficial Great Saphenous Vein: No evidence of thrombus. Normal compressibility. Other Findings:  Subcutaneous edema. IMPRESSION: No evidence of deep venous thrombosis in the lower extremities. Electronically Signed   By: Richarda OverlieAdam  Henn M.D.   On: 10/04/2018 12:21   Dg Chest Port 1 View Result Date: 10/04/2018 CLINICAL DATA:  Bilateral leg swelling. Left lower leg redness and exudate. EXAM: PORTABLE CHEST 1 VIEW COMPARISON:  07/06/2017. FINDINGS: Stable enlarged cardiac silhouette. Mild decrease in prominence of probable central pulmonary arteries on the right. The upper lung zone pulmonary vasculature remains mildly prominent bilateral upper lobe bullous changes. Unremarkable bones. IMPRESSION: 1. Stable cardiomegaly and mild pulmonary vascular congestion. 2. Mildly decreased prominence of probable central pulmonary arteries on the right. 3. Stable changes of COPD. Electronically Signed   By: Beckie SaltsSteven  Reid  M.D.   On: 10/04/2018 13:29    1410:  Given hypoxia and hx COPD; hour long neb started on arrival. IV lasix given for elevated BNP and pulmonary vascular congestion on CXR. IV abx started for LLE cellulitis. T/C returned from [facility] Dr. Sherryll BurgerShah,  case discussed, including:  HPI, pertinent PM/SHx, VS/PE, dx testing, ED course and treatment:  Agreeable to admit.     Final Clinical Impressions(s) / ED Diagnoses   Final diagnoses:  None    ED Discharge Orders    None       Samuel JesterMcManus, Tim Corriher, DO 10/07/18 [id number]

## 2018-10-05 LAB — BASIC METABOLIC PANEL
Anion gap: 9 (ref 5–15)
BUN: 33 mg/dL — ABNORMAL HIGH (ref 8–23)
CALCIUM: 8.1 mg/dL — AB (ref 8.9–10.3)
CO2: 32 mmol/L (ref 22–32)
Chloride: 95 mmol/L — ABNORMAL LOW (ref 98–111)
Creatinine, Ser: 1 mg/dL (ref 0.61–1.24)
GFR calc Af Amer: >60 mL/min (ref >60)
GFR calc non Af Amer: >60 mL/min (ref >60)
Glucose, Bld: 84 mg/dL (ref 70–99)
Potassium: 5.3 mmol/L — ABNORMAL HIGH (ref 3.5–5.1)
Sodium: 136 mmol/L (ref 135–145)

## 2018-10-05 LAB — CBC
HCT: 53.3 % — ABNORMAL HIGH (ref 39.0–52.0)
Hemoglobin: 16.2 g/dL (ref 13.0–17.0)
MCH: 31.7 pg (ref 26.0–34.0)
MCHC: 30.4 g/dL (ref 30.0–36.0)
MCV: 104.3 fL — ABNORMAL HIGH (ref 80.0–100.0)
Platelets: 89 10*3/uL — ABNORMAL LOW (ref 150–400)
RBC: 5.11 MIL/uL (ref 4.22–5.81)
RDW: 14.2 % (ref 11.5–15.5)
WBC: 7.4 10*3/uL (ref 4.0–10.5)
nRBC: 0 % (ref 0.0–0.2)

## 2018-10-05 LAB — HIV ANTIBODY (ROUTINE TESTING W REFLEX): HIV Screen 4th Generation wRfx: NONREACTIVE

## 2018-10-05 LAB — MAGNESIUM: Magnesium: 1.5 mg/dL — ABNORMAL LOW (ref 1.7–2.4)

## 2018-10-05 NOTE — Plan of Care (Addendum)
Dr. Text to inform 30 beat run of VTAC( noted by tele.- Strip saved. Patient a symptomatic.

## 2018-10-05 NOTE — Progress Notes (Signed)
PROGRESS NOTE    Derrick [name]  YEB:[id number] DOB: Mar 17, 1951 DOA: 10/04/2018 PCP: Derrick Morgan, MD   Brief Narrative:  Per HPI:  Derrick [name] is a 68 y.o. male with medical history significant for chronic diastolic heart failure, COPD, and hypertension who presented to the ED with worsening bilateral lower extremity edema over the past 1 week as well as weight gain.  He states he has been taking his home furosemide with no improvement.  He started to become short of breath and has also noted some erythema and warmth to his left foot.  He states that he probably wear a sock "too tight" and this may have caused the problem.  He denies any chest pain, cough, fevers, chills, abdominal pain, nausea, or vomiting.  Patient was admitted with acute hypoxemic respiratory failure secondary to volume overload and acute on chronic diastolic CHF decompensation.  He is also noted to have left lower extremity cellulitis and has been started on IV Ancef.  Assessment & Plan:   Principal Problem:   Acute hypoxemic respiratory failure (HCC) Active Problems:   Pulmonary HTN (HCC)   Cellulitis   Acute on chronic diastolic CHF (congestive heart failure) (HCC)  1. Acute hypoxemic respiratory failure secondary to volume overload with suspected acute on chronic diastolic congestive heart failure exacerbation.  Continue on Lasix IV twice daily for diuresis and monitor weights as well as intake and output.  Thus far, he is down 2 L of fluid.  Will discontinue potassium supplementation due to elevation of levels this morning noted by pharmacy. Will wean oxygen as tolerated.  Check 2D echocardiogram as prior was performed on 05/2017 with LVEF of 60 to 65% and indeterminate diastolic dysfunction at that time.    Repeat 2D echocardiogram with similar findings, but greatly elevated right ventricular systolic pressures.  Hold beta-blockers due to soft blood pressure readings and lower heart rate at this time. 2. Left  lower extremity cellulitis.  Continue on IV Ancef for now and monitor for improvement.  Will convert to oral antibiotics by a.m. if further improved.  3. History of COPD.  Maintain on duo nebs as needed.  Patient does not appear to have any acute bronchospasms or wheezing currently noted.  DVT prophylaxis: Heparin Code Status: Full Family Communication: Brother at bedside Disposition Plan: Continue diuresis as well as treatment with IV Ancef.  Recheck a.m. labs.  Wean oxygen as tolerated.   Consultants:   None  Procedures:   2D echocardiogram performed on 2/28 with findings of LVEF 60 to 65% and significantly elevated right ventricular systolic pressures.  Antimicrobials:   Ancef 2/28->   Subjective: Patient seen and evaluated today with no new acute complaints or concerns. No acute concerns or events noted overnight.  He states that he is starting to feel better and has less lower extremity tenderness and pain.  He appears to be diuresing well with approximately 2 L off last night.  Objective: Vitals:   10/05/18 0500 10/05/18 0625 10/05/18 0627 10/05/18 1010  BP:  (!) 79/59 (!) 84/63 94/60  Pulse:  70 68 64  Resp:  16    Temp:  97.9 F (36.6 C)    TempSrc:  Oral    SpO2:  92% 92%   Weight: 65.2 kg     Height:        Intake/Output Summary (Last 24 hours) at 10/05/2018 1047 Last data filed at 10/05/2018 0700 Gross per 24 hour  Intake 263.92 ml  Output 2400 ml  Net -2136.08 ml   Filed Weights   10/04/18 1116 10/05/18 0500  Weight: 63.5 kg 65.2 kg    Examination:  General exam: Appears calm and comfortable  Respiratory system: Clear to auscultation. Respiratory effort normal.  Currently on 2 L nasal cannula. Cardiovascular system: S1 & S2 heard, RRR. No JVD, murmurs, rubs, gallops or clicks. No pedal edema. Gastrointestinal system: Abdomen is nondistended, soft and nontender. No organomegaly or masses felt. Normal bowel sounds heard. Central nervous system: Alert  and oriented. No focal neurological deficits. Extremities: 1-2+ pitting edema noted bilaterally along with tenderness to mid shin and foot on left side.  Erythema improved. Skin: No rashes, lesions or ulcers Psychiatry: Judgement and insight appear normal. Mood & affect appropriate.     Data Reviewed: I have personally reviewed following labs and imaging studies  CBC: Recent Labs  Lab 10/04/18 1211 10/05/18 0709  WBC 8.1 7.4  NEUTROABS 7.2  --   HGB 17.4* 16.2  HCT 56.1* 53.3*  MCV 103.7* 104.3*  PLT 101* 89*   Basic Metabolic Panel: Recent Labs  Lab 10/04/18 1211 10/05/18 0709  NA 139 136  K 4.8 5.3*  CL 99 95*  CO2 31 32  GLUCOSE 94 84  BUN 38* 33*  CREATININE 1.12 1.00  CALCIUM 8.3* 8.1*  MG  --  1.5*   GFR: Estimated Creatinine Clearance: 67.9 mL/min (by C-G formula based on SCr of 1 mg/dL). Liver Function Tests: Recent Labs  Lab 10/04/18 1211  AST 34  ALT 31  ALKPHOS 59  BILITOT 2.0*  PROT 6.0*  ALBUMIN 3.4*   No results for input(s): LIPASE, AMYLASE in the last 168 hours. No results for input(s): AMMONIA in the last 168 hours. Coagulation Profile: No results for input(s): INR, PROTIME in the last 168 hours. Cardiac Enzymes: Recent Labs  Lab 10/04/18 1129  TROPONINI 0.09*   BNP (last 3 results) No results for input(s): PROBNP in the last 8760 hours. HbA1C: No results for input(s): HGBA1C in the last 72 hours. CBG: No results for input(s): GLUCAP in the last 168 hours. Lipid Profile: No results for input(s): CHOL, HDL, LDLCALC, TRIG, CHOLHDL, LDLDIRECT in the last 72 hours. Thyroid Function Tests: No results for input(s): TSH, T4TOTAL, FREET4, T3FREE, THYROIDAB in the last 72 hours. Anemia Panel: No results for input(s): VITAMINB12, FOLATE, FERRITIN, TIBC, IRON, RETICCTPCT in the last 72 hours. Sepsis Labs: No results for input(s): PROCALCITON, LATICACIDVEN in the last 168 hours.  No results found for this or any previous visit (from the  past 240 hour(s)).       Radiology Studies: US Venous Img Lower Bilateral  Result Date: 10/04/2018 CLINICAL DATA:  Bilateral leg swelling. EXAM: BILATERAL LOWER EXTREMITY VENOUS DOPPLER ULTRASOUND TECHNIQUE: Gray-scale sonography with graded compression, as well as color Doppler and duplex ultrasound were performed to evaluate the lower extremity deep venous systems from the level of the common femoral vein and including the common femoral, femoral, profunda femoral, popliteal and calf veins including the posterior tibial, peroneal and gastrocnemius veins when visible. The superficial great saphenous vein was also interrogated. Spectral Doppler was utilized to evaluate flow at rest and with distal augmentation maneuvers in the common femoral, femoral and popliteal veins. COMPARISON:  None. FINDINGS: RIGHT LOWER EXTREMITY Common Femoral Vein: No evidence of thrombus. Normal compressibility, respiratory phasicity and response to augmentation. Saphenofemoral Junction: No evidence of thrombus. Normal compressibility and flow on color Doppler imaging. Profunda Femoral Vein: No evidence of thrombus. Normal compressibility and flow on  color Doppler imaging. Femoral Vein: No evidence of thrombus. Normal compressibility, respiratory phasicity and response to augmentation. Popliteal Vein: No evidence of thrombus. Normal compressibility, respiratory phasicity and response to augmentation. Calf Veins: Visualized right deep calf veins are patent without thrombus. Other Findings:  Subcutaneous edema in the right lower extremity. LEFT LOWER EXTREMITY Common Femoral Vein: No evidence of thrombus. Normal compressibility, respiratory phasicity and response to augmentation. Saphenofemoral Junction: No evidence of thrombus. Normal compressibility and flow on color Doppler imaging. Profunda Femoral Vein: No evidence of thrombus. Normal compressibility and flow on color Doppler imaging. Femoral Vein: No evidence of thrombus.  Normal compressibility, respiratory phasicity and response to augmentation. Popliteal Vein: No evidence of thrombus. Normal compressibility, respiratory phasicity and response to augmentation. Calf Veins: Visualized left deep calf veins are patent without thrombus. Superficial Great Saphenous Vein: No evidence of thrombus. Normal compressibility. Other Findings:  Subcutaneous edema. IMPRESSION: No evidence of deep venous thrombosis in the lower extremities. Electronically Signed   By: Richarda OverlieAdam  Henn M.D.   On: 10/04/2018 12:21   Dg Chest Port 1 View  Result Date: 10/04/2018 CLINICAL DATA:  Bilateral leg swelling. Left lower leg redness and exudate. EXAM: PORTABLE CHEST 1 VIEW COMPARISON:  07/06/2017. FINDINGS: Stable enlarged cardiac silhouette. Mild decrease in prominence of probable central pulmonary arteries on the right. The upper lung zone pulmonary vasculature remains mildly prominent bilateral upper lobe bullous changes. Unremarkable bones. IMPRESSION: 1. Stable cardiomegaly and mild pulmonary vascular congestion. 2. Mildly decreased prominence of probable central pulmonary arteries on the right. 3. Stable changes of COPD. Electronically Signed   By: Beckie SaltsSteven  Reid M.D.   On: 10/04/2018 13:29        Scheduled Meds: . fluticasone  2 spray Each Nare Daily  . furosemide  40 mg Intravenous Q12H  . heparin  5,000 Units Subcutaneous Q8H  . sodium chloride flush  3 mL Intravenous Q12H   Continuous Infusions: . sodium chloride    .  ceFAZolin (ANCEF) IV 2 g (10/05/18 0627)     LOS: 1 day    Time spent: 30 minutes    Katarzyna Wolven Hoover Brunette Orvie Caradine, DO [facility] Pager [phone]  If 7PM-7AM, please contact night-coverage [email or web address] Password [email or web address] 10/05/2018, 10:47 AM

## 2018-10-06 ENCOUNTER — Other Ambulatory Visit: Payer: Self-pay

## 2018-10-06 LAB — BASIC METABOLIC PANEL
Anion gap: 8 (ref 5–15)
BUN: 28 mg/dL — ABNORMAL HIGH (ref 8–23)
CO2: 35 mmol/L — ABNORMAL HIGH (ref 22–32)
Calcium: 8 mg/dL — ABNORMAL LOW (ref 8.9–10.3)
Chloride: 95 mmol/L — ABNORMAL LOW (ref 98–111)
Creatinine, Ser: 0.93 mg/dL (ref 0.61–1.24)
GFR calc Af Amer: >60 mL/min (ref >60)
Glucose, Bld: 77 mg/dL (ref 70–99)
Potassium: 4.4 mmol/L (ref 3.5–5.1)
Sodium: 138 mmol/L (ref 135–145)

## 2018-10-06 LAB — CBC
HCT: 52 % (ref 39.0–52.0)
Hemoglobin: 16.2 g/dL (ref 13.0–17.0)
MCH: 32.6 pg (ref 26.0–34.0)
MCHC: 31.2 g/dL (ref 30.0–36.0)
MCV: 104.6 fL — ABNORMAL HIGH (ref 80.0–100.0)
PLATELETS: 93 10*3/uL — AB (ref 150–400)
RBC: 4.97 MIL/uL (ref 4.22–5.81)
RDW: 14 % (ref 11.5–15.5)
WBC: 6.9 10*3/uL (ref 4.0–10.5)
nRBC: 0 % (ref 0.0–0.2)

## 2018-10-06 LAB — MAGNESIUM: Magnesium: 1.5 mg/dL — ABNORMAL LOW (ref 1.7–2.4)

## 2018-10-06 MED ORDER — MAGNESIUM SULFATE 2 GM/50ML IV SOLN
2.0000 g | Freq: Once | INTRAVENOUS | Status: AC
  Administered 2018-10-06: 2 g via INTRAVENOUS
  Filled 2018-10-06: qty 50

## 2018-10-06 NOTE — Progress Notes (Signed)
PROGRESS NOTE    Derrick [name]  UYQ:[id number] DOB: 07-11-1951 DOA: 10/04/2018 PCP: Gareth Morgan, MD   Brief Narrative:  Per HPI:  Derrick [name] a 68 y.o.malewith medical history significant forchronic diastolic heart failure, COPD, and hypertension who presented to the ED with worsening bilateral lower extremity edema over the past 1 week as well as weight gain. He states he has been taking his home furosemide with no improvement. He started to become short of breath and has also noted some erythema and warmth to his left foot. He states that he probably wear a sock "too tight" and this may have caused the problem. He denies any chest pain, cough, fevers, chills, abdominal pain, nausea, or vomiting.  Patient was admitted with acute hypoxemic respiratory failure secondary to volume overload and acute on chronic diastolic CHF decompensation and is diuresing well.  He is also noted to have left lower extremity cellulitis and has been started on IV Ancef.  Assessment & Plan:   Principal Problem:   Acute hypoxemic respiratory failure (HCC) Active Problems:   Pulmonary HTN (HCC)   Cellulitis   Acute on chronic diastolic CHF (congestive heart failure) (HCC)   1. Acute hypoxemic respiratory failure secondary to volume overload with suspected acute on chronic diastolic congestive heart failure exacerbation. Continue on Lasix IV twice daily for diuresis and monitor weights as well as intake and output.  Thus far, he is down 4.5 L of fluid.  Monitor a.m. labs and potassium levels.Will wean oxygen as tolerated to room air. Check 2D echocardiogram as prior was performed on 05/2017 with LVEF of 60 to 65% and indeterminate diastolic dysfunction at that time.   Repeat 2D echocardiogram with similar findings, but greatly elevated right ventricular systolic pressures.  Hold beta-blockers due to soft blood pressure readings and lower heart rate at this time. 2. Left lower extremity  cellulitis. Continue on IV Ancef for now and monitor for improvement. Will convert to oral antibiotics by a.m. if further improved.  3. History of COPD. Maintain on duo nebs as needed. Patient does not appear to have any acute bronchospasms or wheezing currently noted. 4. Hypomagnesemia.  Replete and reevaluate in a.m.  DVT prophylaxis: Heparin Code Status: Full Family Communication: Brother at bedside Disposition Plan: Continue diuresis as well as treatment with IV Ancef.  Recheck a.m. labs.  Wean oxygen as tolerated.   Consultants:   None  Procedures:   2D echocardiogram performed on 2/28 with findings of LVEF 60 to 65% and significantly elevated right ventricular systolic pressures.  Antimicrobials:   Ancef 2/28->   Subjective: Patient seen and evaluated today with no new acute complaints or concerns. No acute concerns or events noted overnight.  He appears to be diuresing well and has had 2.5 L of fluid diuresed overnight.  He was noted to have an episode of V. tach last night, but was asymptomatic.  We will continue to monitor on telemetry.  Redness and swelling to his left leg appears to be improving as well.  He continues to remain on 2 L nasal cannula.  Objective: Vitals:   10/05/18 1443 10/05/18 2132 10/06/18 0500 10/06/18 0520  BP: 97/75 101/64  101/72  Pulse: 81 80  83  Resp: 16 18  18   Temp: 98.6 F (37 C)   98.8 F (37.1 C)  TempSrc: Oral   Oral  SpO2: 91% 91%  91%  Weight:   62.6 kg   Height:        Intake/Output  Summary (Last 24 hours) at 10/06/2018 1156 Last data filed at 10/06/2018 0900 Gross per 24 hour  Intake 1020 ml  Output 2475 ml  Net -1455 ml   Filed Weights   10/04/18 1116 10/05/18 0500 10/06/18 0500  Weight: 63.5 kg 65.2 kg 62.6 kg    Examination:  General exam: Appears calm and comfortable  Respiratory system: Clear to auscultation. Respiratory effort normal.  Currently on 2 L nasal cannula. Cardiovascular system: S1 & S2  heard, RRR. No JVD, murmurs, rubs, gallops or clicks.  Pedal edema improving. Gastrointestinal system: Abdomen is nondistended, soft and nontender. No organomegaly or masses felt. Normal bowel sounds heard. Central nervous system: Alert and oriented. No focal neurological deficits. Extremities: Symmetric 5 x 5 power.  Left lower extremity erythema and warmth improving. Skin: No rashes, lesions or ulcers Psychiatry: Judgement and insight appear normal. Mood & affect appropriate.     Data Reviewed: I have personally reviewed following labs and imaging studies  CBC: Recent Labs  Lab 10/04/18 1211 10/05/18 0709 10/06/18 0714  WBC 8.1 7.4 6.9  NEUTROABS 7.2  --   --   HGB 17.4* 16.2 16.2  HCT 56.1* 53.3* 52.0  MCV 103.7* 104.3* 104.6*  PLT 101* 89* 93*   Basic Metabolic Panel: Recent Labs  Lab 10/04/18 1211 10/05/18 0709 10/06/18 0714  NA 139 136 138  K 4.8 5.3* 4.4  CL 99 95* 95*  CO2 31 32 35*  GLUCOSE 94 84 77  BUN 38* 33* 28*  CREATININE 1.12 1.00 0.93  CALCIUM 8.3* 8.1* 8.0*  MG  --  1.5* 1.5*   GFR: Estimated Creatinine Clearance: 70.1 mL/min (by C-G formula based on SCr of 0.93 mg/dL). Liver Function Tests: Recent Labs  Lab 10/04/18 1211  AST 34  ALT 31  ALKPHOS 59  BILITOT 2.0*  PROT 6.0*  ALBUMIN 3.4*   No results for input(s): LIPASE, AMYLASE in the last 168 hours. No results for input(s): AMMONIA in the last 168 hours. Coagulation Profile: No results for input(s): INR, PROTIME in the last 168 hours. Cardiac Enzymes: Recent Labs  Lab 10/04/18 1129  TROPONINI 0.09*   BNP (last 3 results) No results for input(s): PROBNP in the last 8760 hours. HbA1C: No results for input(s): HGBA1C in the last 72 hours. CBG: No results for input(s): GLUCAP in the last 168 hours. Lipid Profile: No results for input(s): CHOL, HDL, LDLCALC, TRIG, CHOLHDL, LDLDIRECT in the last 72 hours. Thyroid Function Tests: No results for input(s): TSH, T4TOTAL, FREET4,  T3FREE, THYROIDAB in the last 72 hours. Anemia Panel: No results for input(s): VITAMINB12, FOLATE, FERRITIN, TIBC, IRON, RETICCTPCT in the last 72 hours. Sepsis Labs: No results for input(s): PROCALCITON, LATICACIDVEN in the last 168 hours.  No results found for this or any previous visit (from the past 240 hour(s)).       Radiology Studies: US Venous Img Lower Bilateral  Result Date: 10/04/2018 CLINICAL DATA:  Bilateral leg swelling. EXAM: BILATERAL LOWER EXTREMITY VENOUS DOPPLER ULTRASOUND TECHNIQUE: Gray-scale sonography with graded compression, as well as color Doppler and duplex ultrasound were performed to evaluate the lower extremity deep venous systems from the level of the common femoral vein and including the common femoral, femoral, profunda femoral, popliteal and calf veins including the posterior tibial, peroneal and gastrocnemius veins when visible. The superficial great saphenous vein was also interrogated. Spectral Doppler was utilized to evaluate flow at rest and with distal augmentation maneuvers in the common femoral, femoral and popliteal veins. COMPARISON:  None. FINDINGS: RIGHT LOWER EXTREMITY Common Femoral Vein: No evidence of thrombus. Normal compressibility, respiratory phasicity and response to augmentation. Saphenofemoral Junction: No evidence of thrombus. Normal compressibility and flow on color Doppler imaging. Profunda Femoral Vein: No evidence of thrombus. Normal compressibility and flow on color Doppler imaging. Femoral Vein: No evidence of thrombus. Normal compressibility, respiratory phasicity and response to augmentation. Popliteal Vein: No evidence of thrombus. Normal compressibility, respiratory phasicity and response to augmentation. Calf Veins: Visualized right deep calf veins are patent without thrombus. Other Findings:  Subcutaneous edema in the right lower extremity. LEFT LOWER EXTREMITY Common Femoral Vein: No evidence of thrombus. Normal compressibility,  respiratory phasicity and response to augmentation. Saphenofemoral Junction: No evidence of thrombus. Normal compressibility and flow on color Doppler imaging. Profunda Femoral Vein: No evidence of thrombus. Normal compressibility and flow on color Doppler imaging. Femoral Vein: No evidence of thrombus. Normal compressibility, respiratory phasicity and response to augmentation. Popliteal Vein: No evidence of thrombus. Normal compressibility, respiratory phasicity and response to augmentation. Calf Veins: Visualized left deep calf veins are patent without thrombus. Superficial Great Saphenous Vein: No evidence of thrombus. Normal compressibility. Other Findings:  Subcutaneous edema. IMPRESSION: No evidence of deep venous thrombosis in the lower extremities. Electronically Signed   By: Richarda OverlieAdam  Henn M.D.   On: 10/04/2018 12:21   Dg Chest Port 1 View  Result Date: 10/04/2018 CLINICAL DATA:  Bilateral leg swelling. Left lower leg redness and exudate. EXAM: PORTABLE CHEST 1 VIEW COMPARISON:  07/06/2017. FINDINGS: Stable enlarged cardiac silhouette. Mild decrease in prominence of probable central pulmonary arteries on the right. The upper lung zone pulmonary vasculature remains mildly prominent bilateral upper lobe bullous changes. Unremarkable bones. IMPRESSION: 1. Stable cardiomegaly and mild pulmonary vascular congestion. 2. Mildly decreased prominence of probable central pulmonary arteries on the right. 3. Stable changes of COPD. Electronically Signed   By: Beckie SaltsSteven  Reid M.D.   On: 10/04/2018 13:29        Scheduled Meds: . fluticasone  2 spray Each Nare Daily  . furosemide  40 mg Intravenous Q12H  . heparin  5,000 Units Subcutaneous Q8H  . sodium chloride flush  3 mL Intravenous Q12H   Continuous Infusions: . sodium chloride    .  ceFAZolin (ANCEF) IV 2 g (10/06/18 0519)     LOS: 2 days    Time spent: 30 minutes    Edris Friedt Hoover Brunette Ayron Fillinger, DO [facility] Pager [phone]  If 7PM-7AM, please  contact night-coverage [email or web address] Password [email or web address] 10/06/2018, 11:56 AM

## 2018-10-07 LAB — CBC
HCT: 47.7 % (ref 39.0–52.0)
Hemoglobin: 15.2 g/dL (ref 13.0–17.0)
MCH: 33.1 pg (ref 26.0–34.0)
MCHC: 31.9 g/dL (ref 30.0–36.0)
MCV: 103.9 fL — ABNORMAL HIGH (ref 80.0–100.0)
Platelets: 97 10*3/uL — ABNORMAL LOW (ref 150–400)
RBC: 4.59 MIL/uL (ref 4.22–5.81)
RDW: 13.8 % (ref 11.5–15.5)
WBC: 5.6 10*3/uL (ref 4.0–10.5)
nRBC: 0 % (ref 0.0–0.2)

## 2018-10-07 LAB — BASIC METABOLIC PANEL
Anion gap: 8 (ref 5–15)
BUN: 25 mg/dL — ABNORMAL HIGH (ref 8–23)
CHLORIDE: 91 mmol/L — AB (ref 98–111)
CO2: 38 mmol/L — ABNORMAL HIGH (ref 22–32)
CREATININE: 0.85 mg/dL (ref 0.61–1.24)
Calcium: 7.6 mg/dL — ABNORMAL LOW (ref 8.9–10.3)
GFR calc Af Amer: >60 mL/min (ref >60)
GFR calc non Af Amer: >60 mL/min (ref >60)
Glucose, Bld: 87 mg/dL (ref 70–99)
Potassium: 3.6 mmol/L (ref 3.5–5.1)
Sodium: 137 mmol/L (ref 135–145)

## 2018-10-07 LAB — MAGNESIUM: Magnesium: 1.7 mg/dL (ref 1.7–2.4)

## 2018-10-07 MED ORDER — CEPHALEXIN 250 MG PO CAPS
250.0000 mg | ORAL_CAPSULE | Freq: Four times a day (QID) | ORAL | Status: DC
  Administered 2018-10-07 – 2018-10-10 (×13): 250 mg via ORAL
  Filled 2018-10-07 (×13): qty 1

## 2018-10-07 NOTE — Progress Notes (Signed)
PROGRESS NOTE    Derrick [name]  YOV:[id number] DOB: 1951/06/23 DOA: 10/04/2018 PCP: Gareth Morgan, MD   Brief Narrative:  Per HPI:  Derrick [name] a 68 y.o.malewith medical history significant forchronic diastolic heart failure, COPD, and hypertension who presented to the ED with worsening bilateral lower extremity edema over the past 1 week as well as weight gain. He states he has been taking his home furosemide with no improvement. He started to become short of breath and has also noted some erythema and warmth to his left foot. He states that he probably wear a sock "too tight" and this may have caused the problem. He denies any chest pain, cough, fevers, chills, abdominal pain, nausea, or vomiting.  Patient was admitted with acute hypoxemic respiratory failure secondary to volume overload and acute on chronic diastolic CHF decompensation and is diuresing well. He is also noted to have left lower extremity cellulitis and has been started on IV Ancef.   Assessment & Plan:   Principal Problem:   Acute hypoxemic respiratory failure (HCC) Active Problems:   Pulmonary HTN (HCC)   Cellulitis   Acute on chronic diastolic CHF (congestive heart failure) (HCC)  1. Acute hypoxemic respiratory failure secondary to volume overload with suspected acute on chronic diastolic congestive heart failure exacerbation. Continue on Lasix IV twice daily for diuresis and monitor weights as well as intake and output.Thus far, he is down 6 L of fluid.  Monitor a.m. labs and potassium levels.Will wean oxygen as tolerated to room air, although it appears he may need home oxygen at this point. Check 2D echocardiogram as prior was performed on 05/2017 with LVEF of 60 to 65% and indeterminate diastolic dysfunction at that time.Repeat 2D echocardiogram with similar findings, but greatly elevated right ventricular systolic pressures. Hold beta-blockers due to soft blood pressure readings and lower  heart rate at this time. 2. Left lower extremity cellulitis. IV Ancef switched to oral Keflex at this time as cellulitis is improved. 3. History of COPD. Maintain on duo nebs as needed. Patient does not appear to have any acute bronchospasms or wheezing currently noted. 4. Hypomagnesemia-resolved.  We will continue to follow in a.m.  DVT prophylaxis:Heparin Code Status:Full Family Communication:Brother at bedside Disposition Plan:Continue diuresis as well as treatment with IV Ancef. Recheck a.m. labs. Wean oxygen as tolerated.   Consultants:  None  Procedures:  2D echocardiogram performed on 2/28 with findings of LVEF 60 to 65% and significantly elevated right ventricular systolic pressures.  Antimicrobials:  Ancef 2/28-3/2  Keflex 3/2->   Subjective: Patient seen and evaluated today with no new acute complaints or concerns. No acute concerns or events noted overnight.  He has lower extremity edema appears to be improving as well as erythema to the left lower extremity.  Objective: Vitals:   10/06/18 0520 10/06/18 1432 10/06/18 2118 10/07/18 0521  BP: 101/72 95/62 111/82 100/71  Pulse: 83 92 90 70  Resp: 18 16 18 17   Temp: 98.8 F (37.1 C) 98.4 F (36.9 C) 98.7 F (37.1 C) 98.9 F (37.2 C)  TempSrc: Oral Oral Oral Oral  SpO2: 91% 92% 92% 91%  Weight:    61.6 kg  Height:        Intake/Output Summary (Last 24 hours) at 10/07/2018 1134 Last data filed at 10/07/2018 0500 Gross per 24 hour  Intake 630.7 ml  Output 2250 ml  Net -1619.3 ml   Filed Weights   10/05/18 0500 10/06/18 0500 10/07/18 0521  Weight: 65.2 kg 62.6 kg  61.6 kg    Examination:  General exam: Appears calm and comfortable  Respiratory system: Clear to auscultation. Respiratory effort normal.  Currently on 2 L nasal cannula. Cardiovascular system: S1 & S2 heard, RRR. No JVD, murmurs, rubs, gallops or clicks.  Pedal edema improving. Gastrointestinal system: Abdomen is nondistended,  soft and nontender. No organomegaly or masses felt. Normal bowel sounds heard. Central nervous system: Alert and oriented. No focal neurological deficits. Extremities: Symmetric 5 x 5 power. Skin: No rashes, lesions or ulcers, warmth and erythema to left lower extremity improved. Psychiatry: Judgement and insight appear normal. Mood & affect appropriate.     Data Reviewed: I have personally reviewed following labs and imaging studies  CBC: Recent Labs  Lab 10/04/18 1211 10/05/18 0709 10/06/18 0714 10/07/18 0557  WBC 8.1 7.4 6.9 5.6  NEUTROABS 7.2  --   --   --   HGB 17.4* 16.2 16.2 15.2  HCT 56.1* 53.3* 52.0 47.7  MCV 103.7* 104.3* 104.6* 103.9*  PLT 101* 89* 93* 97*   Basic Metabolic Panel: Recent Labs  Lab 10/04/18 1211 10/05/18 0709 10/06/18 0714 10/07/18 0557  NA 139 136 138 137  K 4.8 5.3* 4.4 3.6  CL 99 95* 95* 91*  CO2 31 32 35* 38*  GLUCOSE 94 84 77 87  BUN 38* 33* 28* 25*  CREATININE 1.12 1.00 0.93 0.85  CALCIUM 8.3* 8.1* 8.0* 7.6*  MG  --  1.5* 1.5* 1.7   GFR: Estimated Creatinine Clearance: 75.5 mL/min (by C-G formula based on SCr of 0.85 mg/dL). Liver Function Tests: Recent Labs  Lab 10/04/18 1211  AST 34  ALT 31  ALKPHOS 59  BILITOT 2.0*  PROT 6.0*  ALBUMIN 3.4*   No results for input(s): LIPASE, AMYLASE in the last 168 hours. No results for input(s): AMMONIA in the last 168 hours. Coagulation Profile: No results for input(s): INR, PROTIME in the last 168 hours. Cardiac Enzymes: Recent Labs  Lab 10/04/18 1129  TROPONINI 0.09*   BNP (last 3 results) No results for input(s): PROBNP in the last 8760 hours. HbA1C: No results for input(s): HGBA1C in the last 72 hours. CBG: No results for input(s): GLUCAP in the last 168 hours. Lipid Profile: No results for input(s): CHOL, HDL, LDLCALC, TRIG, CHOLHDL, LDLDIRECT in the last 72 hours. Thyroid Function Tests: No results for input(s): TSH, T4TOTAL, FREET4, T3FREE, THYROIDAB in the last 72  hours. Anemia Panel: No results for input(s): VITAMINB12, FOLATE, FERRITIN, TIBC, IRON, RETICCTPCT in the last 72 hours. Sepsis Labs: No results for input(s): PROCALCITON, LATICACIDVEN in the last 168 hours.  No results found for this or any previous visit (from the past 240 hour(s)).       Radiology Studies: No results found.      Scheduled Meds: . cephALEXin  250 mg Oral Q6H  . fluticasone  2 spray Each Nare Daily  . furosemide  40 mg Intravenous Q12H  . heparin  5,000 Units Subcutaneous Q8H  . sodium chloride flush  3 mL Intravenous Q12H   Continuous Infusions: . sodium chloride       LOS: 3 days    Time spent: 30 minutes    Elise Knobloch Hoover Brunette, DO [facility] Pager [phone]  If 7PM-7AM, please contact night-coverage [email or web address] Password [facility] 10/07/2018, 11:34 AM

## 2018-10-07 NOTE — Progress Notes (Signed)
Patient desatting to 37 when not wearing O2, unable to raise sats until O2 increased to 5L. Pt now satting at 95 on 3L. Will continue to monitor.

## 2018-10-07 NOTE — Care Management Important Message (Signed)
Important Message  Patient Details  Name: Derrick [name] MRN: [id number] Date of Birth: Jan 02, 1951   Medicare Important Message Given:  Yes    [name] 10/07/2018, 12:07 PM

## 2018-10-08 LAB — BASIC METABOLIC PANEL
Anion gap: 9 (ref 5–15)
BUN: 20 mg/dL (ref 8–23)
CO2: 39 mmol/L — ABNORMAL HIGH (ref 22–32)
Calcium: 7.8 mg/dL — ABNORMAL LOW (ref 8.9–10.3)
Chloride: 92 mmol/L — ABNORMAL LOW (ref 98–111)
Creatinine, Ser: 0.75 mg/dL (ref 0.61–1.24)
GFR calc Af Amer: >60 mL/min (ref >60)
GFR calc non Af Amer: >60 mL/min (ref >60)
Glucose, Bld: 89 mg/dL (ref 70–99)
Potassium: 3.4 mmol/L — ABNORMAL LOW (ref 3.5–5.1)
Sodium: 140 mmol/L (ref 135–145)

## 2018-10-08 LAB — MAGNESIUM: Magnesium: 1.6 mg/dL — ABNORMAL LOW (ref 1.7–2.4)

## 2018-10-08 MED ORDER — POTASSIUM CHLORIDE CRYS ER 20 MEQ PO TBCR
40.0000 meq | EXTENDED_RELEASE_TABLET | Freq: Once | ORAL | Status: AC
  Administered 2018-10-08: 40 meq via ORAL
  Filled 2018-10-08: qty 2

## 2018-10-08 MED ORDER — ZOLPIDEM TARTRATE 5 MG PO TABS
5.0000 mg | ORAL_TABLET | Freq: Once | ORAL | Status: AC
  Administered 2018-10-08: 5 mg via ORAL
  Filled 2018-10-08: qty 1

## 2018-10-08 MED ORDER — MAGNESIUM SULFATE 2 GM/50ML IV SOLN
2.0000 g | Freq: Once | INTRAVENOUS | Status: AC
  Administered 2018-10-08: 2 g via INTRAVENOUS
  Filled 2018-10-08: qty 50

## 2018-10-08 NOTE — Care Management Note (Signed)
Case Management Note  Patient Details  Name: Derrick [name] MRN: [id number] Date of Birth: 12-02-1950  Subjective/Objective:      Admitted for CHF. Pt from home, lives with mother. ind pta. Has insurance and PCP. Drives. Uses [facility] for pharmacy. Has a scale. His mom prepares meals.he has a neb machine pta. He does not have a walker pta. He is on oxygen acutely. He plans to DC home with self care.               Action/Plan: DC home with self care. Need to have home O2 assessment prior to DC if unable to wean. Has recommended [employer] be DME provider.  Expected Discharge Date:                  Expected Discharge Plan:  Home/Self Care  In-House Referral:  NA  Discharge planning Services  CM Consult  Post Acute Care Choice:    Choice offered to:  Patient  DME Arranged:    DME Agency:     HH Arranged:    HH Agency:     Status of Service:  In process, will continue to follow  If discussed at Long Length of Stay Meetings, dates discussed:    Additional Comments:  Malcolm Metro, RN 10/08/2018, 11:35 AM

## 2018-10-08 NOTE — Progress Notes (Signed)
PROGRESS NOTE    Derrick [name]  ZOX:[id number] DOB: 1951-01-28 DOA: 10/04/2018 PCP: Gareth MorganKnowlton, Steve, MD   Brief Narrative:  Per HPI:  Derrick [name] a 68 y.o.malewith medical history significant forchronic diastolic heart failure, COPD, and hypertension who presented to the ED with worsening bilateral lower extremity edema over the past 1 week as well as weight gain. He states he has been taking his home furosemide with no improvement. He started to become short of breath and has also noted some erythema and warmth to his left foot. He states that he probably wear a sock "too tight" and this may have caused the problem. He denies any chest pain, cough, fevers, chills, abdominal pain, nausea, or vomiting.  Patient was admitted with acute hypoxemic respiratory failure secondary to volume overload and acute on chronic diastolic CHF decompensationand is diuresing well. He is also noted to have left lower extremity cellulitis and has been started on IV Ancef, but now transitioned to Keflex.  Assessment & Plan:   Principal Problem:   Acute hypoxemic respiratory failure (HCC) Active Problems:   Pulmonary HTN (HCC)   Cellulitis   Acute on chronic diastolic CHF (congestive heart failure) (HCC)   1. Acute hypoxemic respiratory failure secondary to volume overload with suspected acute on chronic diastolic congestive heart failure exacerbation. Continue on Lasix IV twice daily for diuresis and monitor weights as well as intake and output.Thus far, he is down7.5L of fluid and continues to have volume overload and has not reached renal tolerance.Monitor a.m. labs and potassium levels.Will wean oxygen as toleratedto room air, although it appears he may need home oxygen at this point. Check 2D echocardiogram as prior was performed on 05/2017 with LVEF of 60 to 65% and indeterminate diastolic dysfunction at that time.Repeat 2D echocardiogram with similar findings, but greatly  elevated right ventricular systolic pressures. Hold beta-blockers due to soft blood pressure readings and lower heart rate at this time. 2. Left lower extremity cellulitis-improving. IV Ancef switched to oral Keflex on 3/2. 3. History of COPD. Maintain on duo nebs as needed. Patient does not appear to have any acute bronchospasms or wheezing currently noted. 4. Hypomagnesemia/hypokalemia.  Secondary to diuresis.  Will replete and recheck in a.m.  DVT prophylaxis:Heparin Code Status:Full Family Communication:None at bedside this morning Disposition Plan:Continue diuresis as well as treatment with Keflex. Recheck a.m. labs. Wean oxygen as tolerated, but will likely need this when ready for DC.   Consultants:  None  Procedures:  2D echocardiogram performed on 2/28 with findings of LVEF 60 to 65% and significantly elevated right ventricular systolic pressures.  Antimicrobials:  Ancef 2/28-3/2  Keflex 3/2->  Subjective: Patient seen and evaluated today with no new acute complaints or concerns. No acute concerns or events noted overnight.  His lower extremity swelling and erythema continues to improve gradually.  He continues to desaturate on room air and will more than likely require home oxygen on discharge.  Objective: Vitals:   10/07/18 0521 10/07/18 1352 10/07/18 2111 10/08/18 0452  BP: 100/71 104/82 114/80 107/75  Pulse: 70 (!) 49 79 73  Resp: 17 18 18 17   Temp: 98.9 F (37.2 C) 99.3 F (37.4 C) 98.7 F (37.1 C) 98.6 F (37 C)  TempSrc: Oral Oral Oral Oral  SpO2: 91% (!) 76% 95% 91%  Weight: 61.6 kg   60.3 kg  Height:        Intake/Output Summary (Last 24 hours) at 10/08/2018 1130 Last data filed at 10/08/2018 0900 Gross per 24  hour  Intake 480 ml  Output 2000 ml  Net -1520 ml   Filed Weights   10/06/18 0500 10/07/18 0521 10/08/18 0452  Weight: 62.6 kg 61.6 kg 60.3 kg    Examination:  General exam: Appears calm and comfortable  Respiratory  system: Clear to auscultation. Respiratory effort normal.  Currently on 2 L nasal cannula. Cardiovascular system: S1 & S2 heard, RRR. No JVD, murmurs, rubs, gallops or clicks. No pedal edema. Gastrointestinal system: Abdomen is nondistended, soft and nontender. No organomegaly or masses felt. Normal bowel sounds heard. Central nervous system: Alert and oriented. No focal neurological deficits. Extremities: Symmetric 5 x 5 power.  Erythema to left lower extremity now near ankle and improved.  Edema approximately 1+ pitting from mid shin to dorsum of foot. Skin: No rashes, lesions or ulcers Psychiatry: Judgement and insight appear normal. Mood & affect appropriate.     Data Reviewed: I have personally reviewed following labs and imaging studies  CBC: Recent Labs  Lab 10/04/18 1211 10/05/18 0709 10/06/18 0714 10/07/18 0557  WBC 8.1 7.4 6.9 5.6  NEUTROABS 7.2  --   --   --   HGB 17.4* 16.2 16.2 15.2  HCT 56.1* 53.3* 52.0 47.7  MCV 103.7* 104.3* 104.6* 103.9*  PLT 101* 89* 93* 97*   Basic Metabolic Panel: Recent Labs  Lab 10/04/18 1211 10/05/18 0709 10/06/18 0714 10/07/18 0557 10/08/18 0535  NA 139 136 138 137 140  K 4.8 5.3* 4.4 3.6 3.4*  CL 99 95* 95* 91* 92*  CO2 31 32 35* 38* 39*  GLUCOSE 94 84 77 87 89  BUN 38* 33* 28* 25* 20  CREATININE 1.12 1.00 0.93 0.85 0.75  CALCIUM 8.3* 8.1* 8.0* 7.6* 7.8*  MG  --  1.5* 1.5* 1.7 1.6*   GFR: Estimated Creatinine Clearance: 78.5 mL/min (by C-G formula based on SCr of 0.75 mg/dL). Liver Function Tests: Recent Labs  Lab 10/04/18 1211  AST 34  ALT 31  ALKPHOS 59  BILITOT 2.0*  PROT 6.0*  ALBUMIN 3.4*   No results for input(s): LIPASE, AMYLASE in the last 168 hours. No results for input(s): AMMONIA in the last 168 hours. Coagulation Profile: No results for input(s): INR, PROTIME in the last 168 hours. Cardiac Enzymes: Recent Labs  Lab 10/04/18 1129  TROPONINI 0.09*   BNP (last 3 results) No results for input(s):  PROBNP in the last 8760 hours. HbA1C: No results for input(s): HGBA1C in the last 72 hours. CBG: No results for input(s): GLUCAP in the last 168 hours. Lipid Profile: No results for input(s): CHOL, HDL, LDLCALC, TRIG, CHOLHDL, LDLDIRECT in the last 72 hours. Thyroid Function Tests: No results for input(s): TSH, T4TOTAL, FREET4, T3FREE, THYROIDAB in the last 72 hours. Anemia Panel: No results for input(s): VITAMINB12, FOLATE, FERRITIN, TIBC, IRON, RETICCTPCT in the last 72 hours. Sepsis Labs: No results for input(s): PROCALCITON, LATICACIDVEN in the last 168 hours.  No results found for this or any previous visit (from the past 240 hour(s)).       Radiology Studies: No results found.      Scheduled Meds: . cephALEXin  250 mg Oral Q6H  . fluticasone  2 spray Each Nare Daily  . furosemide  40 mg Intravenous Q12H  . heparin  5,000 Units Subcutaneous Q8H  . sodium chloride flush  3 mL Intravenous Q12H   Continuous Infusions: . sodium chloride    . magnesium sulfate 1 - 4 g bolus IVPB 2 g (10/08/18 1042)  LOS: 4 days    Time spent: 30 minutes    Blimie Vaness Hoover BrunetteD Aidee Latimore, DO [facility] Pager [phone]  If 7PM-7AM, please contact night-coverage [email or web address] Password [email or web address] 10/08/2018, 11:30 AM

## 2018-10-09 LAB — BASIC METABOLIC PANEL
Anion gap: 8 (ref 5–15)
BUN: 19 mg/dL (ref 8–23)
CO2: 42 mmol/L — ABNORMAL HIGH (ref 22–32)
Calcium: 7.8 mg/dL — ABNORMAL LOW (ref 8.9–10.3)
Chloride: 92 mmol/L — ABNORMAL LOW (ref 98–111)
Creatinine, Ser: 0.68 mg/dL (ref 0.61–1.24)
GFR calc Af Amer: >60 mL/min (ref >60)
GFR calc non Af Amer: >60 mL/min (ref >60)
Glucose, Bld: 75 mg/dL (ref 70–99)
Potassium: 3.7 mmol/L (ref 3.5–5.1)
Sodium: 142 mmol/L (ref 135–145)

## 2018-10-09 LAB — CBC
HCT: 49.7 % (ref 39.0–52.0)
Hemoglobin: 15.3 g/dL (ref 13.0–17.0)
MCH: 32.8 pg (ref 26.0–34.0)
MCHC: 30.8 g/dL (ref 30.0–36.0)
MCV: 106.7 fL — ABNORMAL HIGH (ref 80.0–100.0)
Platelets: 111 10*3/uL — ABNORMAL LOW (ref 150–400)
RBC: 4.66 MIL/uL (ref 4.22–5.81)
RDW: 13.5 % (ref 11.5–15.5)
WBC: 4.4 10*3/uL (ref 4.0–10.5)
nRBC: 0 % (ref 0.0–0.2)

## 2018-10-09 LAB — MAGNESIUM: Magnesium: 1.7 mg/dL (ref 1.7–2.4)

## 2018-10-09 NOTE — Care Management Important Message (Signed)
Important Message  Patient Details  Name: Derrick [name] MRN: [id number] Date of Birth: 05-04-1951   Medicare Important Message Given:  Yes    [name] 10/09/2018, 2:40 PM

## 2018-10-09 NOTE — Care Management (Signed)
Patient Information   Patient Name Derrick [name], Derrick [name] ([id number]) Sex Male DOB 06-28-1951  Room Bed  A332 A332-01  Patient Demographics   Address [address] [place] [address] Phone [phone] [facility]) [phone] (Mobile) *Preferred*  Patient Ethnicity & Race   Ethnic Group Patient Race  Not Hispanic or Latino Black or African American  Emergency Contact(s)   Name Relation Home Work Mobile  Lawson,Carol Friend [phone]    Pearcy,[place] Mother [phone]    Documents on File    Status Date Received Description  Documents for the Patient  [place] HIPAA NOTICE OF PRIVACY - Scanned Not Received    [facility] E-Signature HIPAA Notice of Privacy Received 05/10/12   Driver's License Not Received    Insurance Card Received 12/03/12   Advance Directives/Living Will/HCPOA/POA Not Received    Insurance Card Not Received  not active  Insurance Card Not Received    Insurance Card Not Received  not active  Other Photo ID Not Received    Insurance Card     HIM ROI Authorization  11/17/15   Insurance Card Received 06/15/16 BCBS  HIM ROI Authorization (Expired) 06/21/16 [facility]  AMB Correspondence  06/26/16 Ma Hillock MD, S  AMB Correspondence  07/28/16 02/17-10/17 CLINICAL NOTES [facility]  Insurance Card Received 12/03/17 AETNA MEDICARE 2019  Insurance Card     Jay E-Signature HIPAA Notice of Privacy Signed 10/04/18   Documents for the Encounter  AOB (Assignment of Insurance Benefits) Not Received    E-signature AOB Signed 10/04/18   MEDICARE RIGHTS Not Received    E-signature Medicare Rights Signed 10/04/18   Ultrasound Received 10/04/18   Cardiac Monitoring Strip Received 10/04/18   Cardiac Monitoring Strip Shift Summary Received 10/05/18   ED Patient Billing Extract   ED PB Billing Extract  EKG Received 10/07/18   Admission Information   Current Information   Attending Provider Admitting Provider Admission  Type Admission Status  Shon Hale, MD Erick Blinks, DO Emergency Admission (Confirmed)       Admission Date/Time Discharge Date Hospital Service Auth/Cert Status  10/04/18 11:11 AM  Internal Medicine Incomplete       Hospital Area Unit Room/Bed   [facility] AP-DEPT 300 I778/E423-53        Admission   Complaint  [facility] Account   Name Acct ID Class Status Primary Coverage  Derrick [name], Derrick [name] [id number] Inpatient Open AETNA MEDICARE - AETNA MEDICARE HMO/PPO      Guarantor Account (for Hospital Account [id number])   Name Relation to Pt Service Area Active? Acct Type  Derrick [name] Self [facility] Yes Personal/Family  Address Phone    [address] [place], [place] [address] [phone](H)        Coverage Information (for Hospital Account [id number])   F/O Payor/Plan Precert #  AETNA MEDICARE/AETNA MEDICARE HMO/PPO   Subscriber Subscriber #  [place], Babar [facility]  Address Phone  [address] [place], [place] [address] [phone]       Care Everywhere ID:  [id number]

## 2018-10-09 NOTE — Progress Notes (Signed)
PROGRESS NOTE    Derrick [name]  RFX:[id number] DOB: June 04, 1951 DOA: 10/04/2018 PCP: Gareth Morgan, MD   Brief Narrative:  Per HPI:  Derrick [name] a 68 y.o.malewith medical history significant forchronic diastolic heart failure, COPD, and hypertension who presented to the ED with worsening bilateral lower extremity edema over the past 1 week as well as weight gain. He states he has been taking his home furosemide with no improvement. He started to become short of breath and has also noted some erythema and warmth to his left foot. He states that he probably wear a sock "too tight" and this may have caused the problem. He denies any chest pain, cough, fevers, chills, abdominal pain, nausea, or vomiting.  Patient was admitted with acute hypoxemic respiratory failure secondary to volume overload and acute on chronic diastolic CHF decompensationand is diuresing well. He is also noted to have left lower extremity cellulitis and has been started on IV Ancef, but now transitioned to Keflex.  Assessment & Plan:   Principal Problem:   Acute hypoxemic respiratory failure (HCC) Active Problems:   Pulmonary HTN (HCC)   Cellulitis   Acute on chronic diastolic CHF (congestive heart failure) (HCC)   1)Acute hypoxemic respiratory failure secondary to volume overload with suspected acute on chronic diastolic congestive heart failure exacerbation. Repeat 2D echocardiogram with EF over 60%, with similar findings, but greatly elevated right ventricular systolic pressures. Hold beta-blockers due to soft blood pressure readings and lower heart rate at this time. - currently on 2 L nasal cannula, failed attempt to wean him off O2, continue diuresis  2)Left lower extremity cellulitis-improving. IV Ancef switched to oral Keflex on 3/2.  3)History of COPD. Maintain on duo nebs as needed. Supplemental oxygen as ordered   4)Hypomagnesemia/hypokalemia.  Secondary to diuresis.  Will replete  and recheck in a.m.  DVT prophylaxis:Heparin Code Status:Full Family Communication:None at bedside this morning Disposition Plan:Continue diuresis as well as treatment with Keflex. Increased work of breathing with minimal activity, Consultants:  None  Procedures:  2D echocardiogram performed on 2/28 with findings of LVEF 60 to 65% and significantly elevated right ventricular systolic pressures.  Antimicrobials:  Ancef 2/28-3/2  Keflex 3/2->  Subjective:  currently on 2 L nasal cannula, failed attempt to wean him off O2--- O2 sats dropped to 83% on room air, No fever no chills, remains very symptomatic with minimum activity  Objective: Vitals:   10/09/18 0636 10/09/18 1008 10/09/18 1009 10/09/18 1432  BP: 103/78   117/88  Pulse: 83   96  Resp: 18   18  Temp: 98.2 F (36.8 C)   98.4 F (36.9 C)  TempSrc: Oral   Oral  SpO2: 92% (!) 81% (!) 3% 94%  Weight:      Height:        Intake/Output Summary (Last 24 hours) at 10/09/2018 1833 Last data filed at 10/09/2018 1700 Gross per 24 hour  Intake 480 ml  Output 1400 ml  Net -920 ml   Filed Weights   10/07/18 0521 10/08/18 0452 10/09/18 0455  Weight: 61.6 kg 60.3 kg 56.3 kg    Examination:  General exam: Appears calm and comfortable  Respiratory system: Diminished in bases, no wheezing,  currently on 2 L nasal cannula, failed attempt to wean him off O2 Cardiovascular system: S1 & S2 heard, RRR. No JVD, murmurs, rubs, gallops or clicks. No pedal edema. Gastrointestinal system: Abdomen is nondistended, soft and nontender. No organomegaly or masses felt. Normal bowel sounds heard. Central nervous  system: Alert and oriented. No focal neurological deficits. Extremities: Symmetric 5 x 5 power.  Erythema to left lower extremity now near ankle and improved.  Edema approximately 1+ pitting from mid shin to dorsum of foot. Skin: No rashes, lesions or ulcers Psychiatry: Judgement and insight appear normal. Mood &  affect appropriate.   CBC: Recent Labs  Lab 10/04/18 1211 10/05/18 0709 10/06/18 0714 10/07/18 0557 10/09/18 0551  WBC 8.1 7.4 6.9 5.6 4.4  NEUTROABS 7.2  --   --   --   --   HGB 17.4* 16.2 16.2 15.2 15.3  HCT 56.1* 53.3* 52.0 47.7 49.7  MCV 103.7* 104.3* 104.6* 103.9* 106.7*  PLT 101* 89* 93* 97* 111*   Basic Metabolic Panel: Recent Labs  Lab 10/05/18 0709 10/06/18 0714 10/07/18 0557 10/08/18 0535 10/09/18 0551  NA 136 138 137 140 142  K 5.3* 4.4 3.6 3.4* 3.7  CL 95* 95* 91* 92* 92*  CO2 32 35* 38* 39* 42*  GLUCOSE 84 77 87 89 75  BUN 33* 28* 25* 20 19  CREATININE 1.00 0.93 0.85 0.75 0.68  CALCIUM 8.1* 8.0* 7.6* 7.8* 7.8*  MG 1.5* 1.5* 1.7 1.6* 1.7   GFR: Estimated Creatinine Clearance: 73.3 mL/min (by C-G formula based on SCr of 0.68 mg/dL). Liver Function Tests: Recent Labs  Lab 10/04/18 1211  AST 34  ALT 31  ALKPHOS 59  BILITOT 2.0*  PROT 6.0*  ALBUMIN 3.4*   No results for input(s): LIPASE, AMYLASE in the last 168 hours. No results for input(s): AMMONIA in the last 168 hours. Coagulation Profile: No results for input(s): INR, PROTIME in the last 168 hours. Cardiac Enzymes: Recent Labs  Lab 10/04/18 1129  TROPONINI 0.09*    Radiology Studies: No results found.  Scheduled Meds: . cephALEXin  250 mg Oral Q6H  . fluticasone  2 spray Each Nare Daily  . furosemide  40 mg Intravenous Q12H  . heparin  5,000 Units Subcutaneous Q8H  . sodium chloride flush  3 mL Intravenous Q12H   Continuous Infusions: . sodium chloride       LOS: 5 days    Shon Hale, MD [facility]   If 7PM-7AM, please contact night-coverage [email or web address] Password [facility] 10/09/2018, 6:33 PM

## 2018-10-10 MED ORDER — POTASSIUM CHLORIDE CRYS ER 20 MEQ PO TBCR
40.0000 meq | EXTENDED_RELEASE_TABLET | Freq: Once | ORAL | Status: AC
  Administered 2018-10-10: 40 meq via ORAL
  Filled 2018-10-10: qty 2

## 2018-10-10 MED ORDER — POTASSIUM CHLORIDE CRYS ER 20 MEQ PO TBCR: 20.0000 meq | EXTENDED_RELEASE_TABLET | Freq: Every day | ORAL | 3 refills | Status: AC

## 2018-10-10 MED ORDER — FUROSEMIDE 10 MG/ML IJ SOLN: 60.0000 mg | Freq: Once | INTRAMUSCULAR | Status: DC

## 2018-10-10 MED ORDER — FLUTICASONE PROPIONATE 50 MCG/ACT NA SUSP: 1.0000 | Freq: Every day | NASAL | 11 refills | Status: AC

## 2018-10-10 MED ORDER — CARVEDILOL 3.125 MG PO TABS: 3.1250 mg | ORAL_TABLET | Freq: Two times a day (BID) | ORAL | 3 refills | Status: DC

## 2018-10-10 MED ORDER — DOXYCYCLINE HYCLATE 100 MG PO TABS: 100.0000 mg | ORAL_TABLET | Freq: Two times a day (BID) | ORAL | 0 refills | Status: AC

## 2018-10-10 MED ORDER — ALBUTEROL SULFATE (2.5 MG/3ML) 0.083% IN NEBU: 2.5000 mg | INHALATION_SOLUTION | RESPIRATORY_TRACT | 11 refills | Status: AC | PRN

## 2018-10-10 MED ORDER — QVAR 80 MCG/ACT IN AERS: 1.0000 | INHALATION_SPRAY | Freq: Every day | RESPIRATORY_TRACT | 11 refills | Status: DC

## 2018-10-10 MED ORDER — FUROSEMIDE 40 MG PO TABS: ORAL_TABLET | ORAL | 1 refills | Status: AC

## 2018-10-10 MED ORDER — CEPHALEXIN 250 MG PO CAPS: 250.0000 mg | ORAL_CAPSULE | Freq: Three times a day (TID) | ORAL | 0 refills | Status: DC

## 2018-10-10 MED ORDER — ALBUTEROL SULFATE HFA 108 (90 BASE) MCG/ACT IN AERS: 1.0000 | INHALATION_SPRAY | Freq: Four times a day (QID) | RESPIRATORY_TRACT | 3 refills | Status: AC | PRN

## 2018-10-10 MED ORDER — LISINOPRIL 2.5 MG PO TABS: 2.5000 mg | ORAL_TABLET | Freq: Every day | ORAL | 3 refills | Status: DC

## 2018-10-10 MED ORDER — FUROSEMIDE 80 MG PO TABS
80.0000 mg | ORAL_TABLET | Freq: Once | ORAL | Status: AC
  Administered 2018-10-10: 80 mg via ORAL
  Filled 2018-10-10: qty 1

## 2018-10-10 NOTE — Discharge Instructions (Signed)
1)Very low-salt diet advised 2)Weigh yourself daily, call if you gain more than 3 pounds in 1 day or more than 5 pounds in 1 week as your diuretic medications may need to be adjusted 3)Limit your Fluid  intake to no more than 50 ounces (1.5 Liters) per day 4)Keep Legs Elevated as much as Possible 5)Repeat BMP Blood Test on Tuesday 10/15/2018 6) use oxygen via nasal cannula at 2 L/min continuously including when you sleep

## 2018-10-10 NOTE — Progress Notes (Signed)
Discharged home with instructions given on medications and follow up visits,patient verbalized understanding. Homehealth to follow up at home,patient discharged with oxygen at 3 liters via [place]. IV discontinued,catheter intact. Accompanied by staff to an awaiting vehicle.

## 2018-10-10 NOTE — Care Management Note (Signed)
Case Management Note  Patient Details  Name: Derrick [name] MRN: [id number] Date of Birth: [birth year]  [facility] not in network and pt would have 48% co-pay. Pt has no preference as to next choice. Pt also requests cane. Referral for oxygen given to Ashly, [facility] rep, who will deliver POC to pt room prior to DC. Referral for cane sent to [facility], [employer] rep who will deliver cane to room prior to DC. Pt has no preference of provider for [facility] PT and nursing. Referral sent to Hilbert Corrigan, [facility] rep, who will pull pt info from chart and make first visit tomorrow.   Expected Discharge Date:    10/10/2018              Expected Discharge Plan:  Home/Self Care  In-House Referral:  NA  Discharge planning Services  CM Consult  Post Acute Care Choice:  Durable Medical Equipment, Home Health Choice offered to:  Patient  DME Arranged:  Oxygen, Cane DME Agency:  Patsy Lager [facility]  HH Arranged:  RN, PT HH Agency:  [facility]  Status of Service:  Completed, signed off  If discussed at Long Length of Stay Meetings, dates discussed:    Additional Comments:  Malcolm Metro, RN 10/10/2018, 1:03 PM

## 2018-10-10 NOTE — Progress Notes (Addendum)
SATURATION QUALIFICATIONS: (This note is used to comply with regulatory documentation for home oxygen)  Patient Saturations on room air at Rest = 81%  Patient Saturations on 3 liters at rest = 91 %  Patient Saturations on 0 Liters of oxygen while Ambulating n/a  Please briefly explain why patient needs home oxygen:

## 2018-10-10 NOTE — Discharge Summary (Signed)
Derrick [name], is a 68 y.o. male  DOB 23-Sep-1950  MRN [id number].  Admission date:  10/04/2018  Admitting Physician  Derrick [name]  Discharge Date:  10/10/2018   Primary [name]  Derrick [name]  Recommendations for primary care physician for things to follow:   1)Very low-salt diet advised 2)Weigh yourself daily, call if you gain more than 3 pounds in 1 day or more than 5 pounds in 1 week as your diuretic medications may need to be adjusted 3)Limit your Fluid  intake to no more than 50 ounces (1.5 Liters) per day 4)Keep Legs Elevated as much as Possible 5)Repeat BMP Blood Test on Tuesday 10/15/2018 6) use oxygen via nasal cannula at 2 L/min continuously including when you sleep  Admission Diagnosis  Hypoxia [R09.02] COPD with acute exacerbation (HCC) [J44.1] Cellulitis of left lower leg [L03.116] Acute on chronic congestive heart failure, unspecified heart failure type (HCC) [I50.9]   Discharge Diagnosis  Hypoxia [R09.02] COPD with acute exacerbation (HCC) [J44.1] Cellulitis of left lower leg [L03.116] Acute on chronic congestive heart failure, unspecified heart failure type (HCC) [I50.9]    Principal Problem:   Acute hypoxemic respiratory failure (HCC) Active Problems:   Pulmonary HTN (HCC)   Cellulitis   Acute on chronic diastolic CHF (congestive heart failure) (HCC)      Past Medical History:  Diagnosis Date  . Allergic rhinitis due to pollen 09/08/2009  . CHF (congestive heart failure) (HCC)   . COPD (chronic obstructive pulmonary disease) (HCC) 03/14/2013  . 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 Family history of diabetes mellitus 09/08/2009   father  . Family history of stroke (cerebrovascular) 09/08/2009   father  . Nicotine dependence, chewing tobacco, uncomplicated 08/07/1968  . Other disturbances of skin sensation 11/08/2012  . Perennial allergic  rhinitis with seasonal variation 11/08/2012  . RA (rheumatoid arthritis) (HCC) 09/08/2009    Past Surgical History:  Procedure Laterality Date  . DENTAL SURGERY       HPI  from the history and physical done on the day of admission:    PCP: Derrick [name]   Patient coming from: Home  Chief Complaint: Bilateral lower extremity edema and pain with dyspnea  HPI: Derrick [name] is a 68 y.o. male with medical history significant for chronic diastolic heart failure, COPD, and hypertension who presented to the ED with worsening bilateral lower extremity edema over the past 1 week as well as weight gain.  He states he has been taking his home furosemide with no improvement.  He started to become short of breath and has also noted some erythema and warmth to his left foot.  He states that he probably wear a sock "too tight" and this may have caused the problem.  He denies any chest pain, cough, fevers, chills, abdominal pain, nausea, or vomiting.   ED Course: Vital signs are stable, but patient was noted to have oxygen saturation of 81% on room air for which she is now on  2 L nasal cannula with some improvement.  Laboratory data unremarkable aside from some thrombocytopenia with platelet count of 101.  Troponin of 0.09 and noted baseline elevation from previous.  Ultrasound of lower extremities performed with no DVTs noted.  BNP of 1874.  Chest x-ray with findings of COPD and some mild cardiomegaly and pulmonary edema.  Patient has been given albuterol neb treatment as well as IV Lasix and has been started on IV Ancef for cellulitis.   Hospital Course:    Brief Narrative:  Per HPI:  Derrick [name] a 68 y.o.malewith medical history significant forchronic diastolic heart failure, COPD, and hypertension who presented to the ED with worsening bilateral lower extremity edema over the past 1 week as well as weight gain. He states he has been taking his home furosemide with no improvement.  He started to become short of breath and has also noted some erythema and warmth to his left foot. He states that he probably wear a sock "too tight" and this may have caused the problem. He denies any chest pain, cough, fevers, chills, abdominal pain, nausea, or vomiting.  Patient was admitted with acute hypoxemic respiratory failure secondary to volume overload and acute on chronic diastolic CHF decompensationand is diuresing well. He is also noted to have left lower extremity cellulitis and has been started on IV Ancef, but now transitioned to Keflex.  Plan:- 1)Acute Hypoxemic Respiratory Failure secondary to a combination of COPD  and volume overload with suspected acute on chronic Diastolic Congestive heart failure exacerbation. Repeat 2D echocardiogram with EF over 60%,  overall improved after IV Lasix diuresis from a clinical standpoint- currently on 2 L nasal cannula, failed attempt to wean him off O2,  discharged home on home O2, discharged home on Lasix 40 mg every morning 20 mg every afternoon  2)HFpEF--acute exacerbation of chronic diastolic dysfunction CHF, treated as above in #1, very low-salt diet advised, CHF instructions given to patient  3)H/o HTN--- BP has been soft, continue low-dose lisinopril 2.5 mg daily, continue low-dose carvedilol 3.125 mg twice daily,   2)Left Lower Extremity Cellulitis- overall much improved, treated withIV Ancef switched to oral Keflex on 10/07/18, discharged on doxycycline and Keflex  3)History of COPD. --- Stable,supplemental oxygen as ordered , continue bronchodilators  4)Generalized weakness/debility/posthospitalization weakness----home health physical therapy as advised, may use cane for ambulation  Code Status:Full Family Communication:None at bedside this morning Disposition Plan: With home health services Consultants:  None  Procedures:  2D echocardiogram performed on 2/28 with findings of LVEF 60 to 65% and significantly  elevated right ventricular systolic pressures.  Antimicrobials:  Ancef 2/28-3/2  Keflex 3/2->  Discharge Condition: stable  Follow UP----PCP for repeat BMP on 10/15/2018  Diet and Activity recommendation:  As advised  Discharge Instructions    Discharge Instructions    Call [name] for:  difficulty breathing, headache or visual disturbances   Complete by:  As directed    Call [name] for:  persistant dizziness or light-headedness   Complete by:  As directed    Call [name] for:  persistant nausea and vomiting   Complete by:  As directed    Call [name] for:  severe uncontrolled pain   Complete by:  As directed    Call [name] for:  temperature >100.4   Complete by:  As directed    Diet - low sodium heart healthy   Complete by:  As directed    Discharge instructions   Complete by:  As directed  1)Very low-salt diet advised 2)Weigh yourself daily, call if you gain more than 3 pounds in 1 day or more than 5 pounds in 1 week as your diuretic medications may need to be adjusted 3)Limit your Fluid  intake to no more than 50 ounces (1.5 Liters) per day 4)Keep Legs Elevated as much as Possible 5)Repeat BMP Blood Test on Tuesday 10/15/2018 6) use oxygen via nasal cannula at 2 L/min continuously including when you sleep   Increase activity slowly   Complete by:  As directed        Discharge Medications     Allergies as of 10/10/2018   No Known Allergies     Medication List    STOP taking these medications   metoprolol tartrate 25 MG tablet Commonly known as:  LOPRESSOR     TAKE these medications   albuterol 108 (90 Base) MCG/ACT inhaler Commonly known as:  PROVENTIL HFA;VENTOLIN HFA Inhale 1-2 puffs into the lungs every 6 (six) hours as needed for wheezing or shortness of breath. What changed:  Another medication with the same name was changed. Make sure you understand how and when to take each.   albuterol (2.5 MG/3ML) 0.083% nebulizer solution Commonly known as:  PROVENTIL Take 3 mLs  (2.5 mg total) by nebulization every 4 (four) hours as needed for wheezing or shortness of breath. What changed:  See the new instructions.   carvedilol 3.125 MG tablet Commonly known as:  COREG Take 1 tablet (3.125 mg total) by mouth 2 (two) times daily.   cephALEXin 250 MG capsule Commonly known as:  KEFLEX Take 1 capsule (250 mg total) by mouth 3 (three) times daily.   [employer] VITAMIN B12 1000 MCG tablet Generic drug:  cyanocobalamin TAKE 2 TABLETS EACH DAY FOR VITAMIN B12 DEFICIENCY.   doxycycline 100 MG tablet Commonly known as:  VIBRA-TABS Take 1 tablet (100 mg total) by mouth 2 (two) times daily for 7 days.   fluticasone 50 MCG/ACT nasal spray Commonly known as:  FLONASE Place 1 spray into both nostrils daily. What changed:  See the new instructions.   furosemide 40 MG tablet Commonly known as:  LASIX 1 (40 mg) Tab po qam and half- Tab (20 mg) qpm What changed:  See the new instructions.   lisinopril 2.5 MG tablet Commonly known as:  PRINIVIL,ZESTRIL Take 1 tablet (2.5 mg total) by mouth daily.   potassium chloride SA 20 MEQ tablet Commonly known as:  KLOR-CON M20 Take 1 tablet (20 mEq total) by mouth daily. What changed:  how much to take   QVAR 80 MCG/ACT inhaler Generic drug:  beclomethasone Inhale 1 puff into the lungs daily. What changed:  See the new instructions.            Durable Medical Equipment  (From admission, onward)         Start     Ordered   10/10/18 1328  For home use only DME oxygen  Once    Comments:  Deliver portable oxygen concentrator  Question Answer Comment  Mode or (Route) Nasal cannula   Liters per Minute 3   Frequency Continuous (stationary and portable oxygen unit needed)   Oxygen conserving device Yes   Oxygen delivery system Gas      10/10/18 1328          Major procedures and Radiology Reports - PLEASE review detailed and final reports for all details, in brief -   US Venous Img Lower Bilateral  Result Date:  10/04/2018  CLINICAL DATA:  Bilateral leg swelling. EXAM: BILATERAL LOWER EXTREMITY VENOUS DOPPLER ULTRASOUND TECHNIQUE: Gray-scale sonography with graded compression, as well as color Doppler and duplex ultrasound were performed to evaluate the lower extremity deep venous systems from the level of the common femoral vein and including the common femoral, femoral, profunda femoral, popliteal and calf veins including the posterior tibial, peroneal and gastrocnemius veins when visible. The superficial great saphenous vein was also interrogated. Spectral Doppler was utilized to evaluate flow at rest and with distal augmentation maneuvers in the common femoral, femoral and popliteal veins. COMPARISON:  None. FINDINGS: RIGHT LOWER EXTREMITY Common Femoral Vein: No evidence of thrombus. Normal compressibility, respiratory phasicity and response to augmentation. Saphenofemoral Junction: No evidence of thrombus. Normal compressibility and flow on color Doppler imaging. Profunda Femoral Vein: No evidence of thrombus. Normal compressibility and flow on color Doppler imaging. Femoral Vein: No evidence of thrombus. Normal compressibility, respiratory phasicity and response to augmentation. Popliteal Vein: No evidence of thrombus. Normal compressibility, respiratory phasicity and response to augmentation. Calf Veins: Visualized right deep calf veins are patent without thrombus. Other Findings:  Subcutaneous edema in the right lower extremity. LEFT LOWER EXTREMITY Common Femoral Vein: No evidence of thrombus. Normal compressibility, respiratory phasicity and response to augmentation. Saphenofemoral Junction: No evidence of thrombus. Normal compressibility and flow on color Doppler imaging. Profunda Femoral Vein: No evidence of thrombus. Normal compressibility and flow on color Doppler imaging. Femoral Vein: No evidence of thrombus. Normal compressibility, respiratory phasicity and response to augmentation. Popliteal Vein: No  evidence of thrombus. Normal compressibility, respiratory phasicity and response to augmentation. Calf Veins: Visualized left deep calf veins are patent without thrombus. Superficial Great Saphenous Vein: No evidence of thrombus. Normal compressibility. Other Findings:  Subcutaneous edema. IMPRESSION: No evidence of deep venous thrombosis in the lower extremities. Electronically Signed   By: Richarda Overlie M.D.   On: 10/04/2018 12:21   Dg Chest Port 1 View  Result Date: 10/04/2018 CLINICAL DATA:  Bilateral leg swelling. Left lower leg redness and exudate. EXAM: PORTABLE CHEST 1 VIEW COMPARISON:  07/06/2017. FINDINGS: Stable enlarged cardiac silhouette. Mild decrease in prominence of probable central pulmonary arteries on the right. The upper lung zone pulmonary vasculature remains mildly prominent bilateral upper lobe bullous changes. Unremarkable bones. IMPRESSION: 1. Stable cardiomegaly and mild pulmonary vascular congestion. 2. Mildly decreased prominence of probable central pulmonary arteries on the right. 3. Stable changes of COPD. Electronically Signed   By: Beckie Salts M.D.   On: 10/04/2018 13:29    Micro Results    No results found for this or any previous visit (from the past 240 hour(s)).  Today   Subjective    Derrick [name] today has no new concerns, shortness of breath at rest is mostly resolved, hypoxia persist, some dyspnea on exertion noted,... Able to speak in complete sentences at rest... Voiding well          Patient has been seen and examined prior to discharge   Objective   Blood pressure 115/80, pulse 86, temperature 98 F (36.7 C), temperature source Oral, resp. rate 18, height 5\' 11"  (1.803 m), weight 58.7 kg, SpO2 95 %.   Intake/Output Summary (Last 24 hours) at 10/10/2018 1414 Last data filed at 10/10/2018 0900 Gross per 24 hour  Intake 600 ml  Output 1800 ml  Net -1200 ml   Exam Gen:- Awake Alert, no acute distress , Speaking in complete sentences HEENT:- [email or web address],  No sclera icterus Nose- 2L/min Neck-Supple Neck,No JVD,.  Lungs-improved air  movement bilaterally, no wheezing  CV- S1, S2 normal, regular Abd-  +ve B.Sounds, Abd Soft, No tenderness,   Extremity:-1+ pitting edema especially on the left,good pulses Psych-affect is appropriate, oriented x3 Neuro-generalized weakness, but no new focal deficits, no tremors  Skin-lower extremity erythema, swelling and warmth continues to improve, no streaking, no drainage   Data Review   CBC w Diff:  Lab Results  Component Value Date   WBC 4.4 10/09/2018   HGB 15.3 10/09/2018   HCT 49.7 10/09/2018   PLT 111 (L) 10/09/2018   LYMPHOPCT 4 10/04/2018   MONOPCT 7 10/04/2018   EOSPCT 0 10/04/2018   BASOPCT 0 10/04/2018    CMP:  Lab Results  Component Value Date   NA 142 10/09/2018   K 3.7 10/09/2018   CL 92 (L) 10/09/2018   CO2 42 (H) 10/09/2018   BUN 19 10/09/2018   CREATININE 0.68 10/09/2018   CREATININE 1.24 07/17/2016   PROT 6.0 (L) 10/04/2018   ALBUMIN 3.4 (L) 10/04/2018   BILITOT 2.0 (H) 10/04/2018   ALKPHOS 59 10/04/2018   AST 34 10/04/2018   ALT 31 10/04/2018    Total Discharge time is about 33 minutes  Shon Haleourage Asta Corbridge M.D on 10/10/2018 at 2:14 PM  Go to [email or web address] -  for contact info  [facility] - Office  [phone]

## 2018-11-22 ENCOUNTER — Telehealth: Payer: Self-pay | Admitting: *Deleted

## 2018-11-22 NOTE — Telephone Encounter (Signed)
Virtual Visit Pre-Appointment Phone Call  Steps For Call:  1. Confirm consent - "In the setting of the current Covid19 crisis, you are scheduled for a (phone or video) visit with your provider on (date) at (time).  Just as we do with many in-office visits, in order for you to participate in this visit, we must obtain consent.  If you'd like, I can send this to your mychart (if signed up) or email for you to review.  Otherwise, I can obtain your verbal consent now.  All virtual visits are billed to your insurance company just like a normal visit would be.  By agreeing to a virtual visit, we'd like you to understand that the technology does not allow for your provider to perform an examination, and thus may limit your provider's ability to fully assess your condition. If your provider identifies any concerns that need to be evaluated in person, we will make arrangements to do so.  Finally, though the technology is pretty good, we cannot assure that it will always work on either your or our end, and in the setting of a video visit, we may have to convert it to a phone-only visit.  In either situation, we cannot ensure that we have a secure connection.  Are you willing to proceed?" STAFF: Did the patient verbally acknowledge consent to telehealth visit? Document YES/NO here: Yes  2. Confirm the BEST phone number to call the day of the visit by including in appointment notes  3. Give patient instructions for WebEx/MyChart download to smartphone as below or Doximity/Doxy.me if video visit (depending on what platform provider is using)  4. Advise patient to be prepared with their blood pressure, heart rate, weight, any heart rhythm information, their current medicines, and a piece of paper and pen handy for any instructions they may receive the day of their visit  5. Inform patient they will receive a phone call 15 minutes prior to their appointment time (may be from unknown caller ID) so they should be  prepared to answer  6. Confirm that appointment type is correct in Epic appointment notes (VIDEO vs PHONE)     TELEPHONE CALL NOTE  Derrick [name] has been deemed a candidate for a follow-up tele-health visit to limit community exposure during the Covid-19 pandemic. I spoke with the patient via phone to ensure availability of phone/video source, confirm preferred email & phone number, and discuss instructions and expectations.  I reminded Derrick [name] to be prepared with any vital sign and/or heart rhythm information that could potentially be obtained via home monitoring, at the time of his visit. I reminded Derrick [name] to expect a phone call at the time of his visit if his visit.  Elesa Massed, LPN 04/20/7828 5:62 PM   INSTRUCTIONS FOR DOWNLOADING THE WEBEX APP TO SMARTPHONE  - If Apple, ask patient to go to [employer] and type in WebEx in the search bar. Download [employer], the blue/green circle. If Android, go to [employer] and type in [employer] in the search bar. The app is free but as with any other app downloads, their phone may require them to verify saved payment information or Apple/Android password.  - The patient does NOT have to create an account. - On the day of the visit, the assist will walk the patient through joining the meeting with the meeting number/password.  INSTRUCTIONS FOR DOWNLOADING THE MYCHART APP TO SMARTPHONE  - The patient must first make  sure to have activated MyChart and know their login information - If Apple, go to [employer] and type in MyChart in the search bar and download the app. If Android, ask patient to go to [employer] and type in [place] in the search bar and download the app. The app is free but as with any other app downloads, their phone may require them to verify saved payment information or Apple/Android password.  - The patient will need to then log into the app with their MyChart username and password, and select [facility] as their healthcare provider to link the account. When it is time for your visit, go to the MyChart app, find appointments, and click Begin Video Visit. Be sure to Select Allow for your device to access the Microphone and Camera for your visit. You will then be connected, and your provider will be with you shortly.  **If they have any issues connecting, or need assistance please contact MyChart service desk [phone])**  **If using a computer, in order to ensure the best quality for their visit they will need to use either of the following Internet Browsers: [employer], or Google Chrome**  IF USING DOXIMITY or DOXY.ME - The patient will receive a link just prior to their visit, either by text or email (to be determined day of appointment depending on if it's doxy.me or Doximity).     FULL LENGTH CONSENT FOR TELE-HEALTH VISIT   I hereby voluntarily request, consent and authorize [place] and its employed or contracted physicians, physician assistants, nurse practitioners or other licensed health care professionals (the Practitioner), to provide me with telemedicine health care services (the "Services") as deemed necessary by the treating Practitioner. I acknowledge and consent to receive the Services by the Practitioner via telemedicine. I understand that the telemedicine visit will involve communicating with the Practitioner through live audiovisual communication technology and the disclosure of certain medical information by electronic transmission. I acknowledge that I have been given the opportunity to request an in-person assessment or other available alternative prior to the telemedicine visit and am voluntarily participating in the telemedicine visit.  I understand that I have the right to withhold or withdraw my consent to the use of telemedicine in the course of my care at any time, without affecting my right to future care or treatment, and that the  Practitioner or I may terminate the telemedicine visit at any time. I understand that I have the right to inspect all information obtained and/or recorded in the course of the telemedicine visit and may receive copies of available information for a reasonable fee.  I understand that some of the potential risks of receiving the Services via telemedicine include:  Marland Kitchen Delay or interruption in medical evaluation due to technological equipment failure or disruption; . Information transmitted may not be sufficient (e.g. poor resolution of images) to allow for appropriate medical decision making by the Practitioner; and/or  . In rare instances, security protocols could fail, causing a breach of personal health information.  Furthermore, I acknowledge that it is my responsibility to provide information about my medical history, conditions and care that is complete and accurate to the best of my ability. I acknowledge that Practitioner's advice, recommendations, and/or decision may be based on factors not within their control, such as incomplete or inaccurate data provided by me or distortions of diagnostic images or specimens that may result from electronic transmissions. I understand that the practice of medicine is not an exact  science and that Practitioner makes no warranties or guarantees regarding treatment outcomes. I acknowledge that I will receive a copy of this consent concurrently upon execution via email to the email address I last provided but may also request a printed copy by calling the office of [facility].    I understand that my insurance will be billed for this visit.   I have read or had this consent read to me. . I understand the contents of this consent, which adequately explains the benefits and risks of the Services being provided via telemedicine.  . I have been provided ample opportunity to ask questions regarding this consent and the Services and have had my questions answered to my  satisfaction. . I give my informed consent for the services to be provided through the use of telemedicine in my medical care  By participating in this telemedicine visit I agree to the above.

## 2018-11-27 ENCOUNTER — Telehealth: Payer: Self-pay

## 2018-11-27 NOTE — Telephone Encounter (Signed)
Medications current, pharmacy update.

## 2018-11-28 ENCOUNTER — Encounter: Payer: Self-pay | Admitting: Cardiology

## 2018-11-28 ENCOUNTER — Telehealth (INDEPENDENT_AMBULATORY_CARE_PROVIDER_SITE_OTHER): Payer: Medicare HMO | Admitting: Cardiology

## 2018-11-28 VITALS — HR 70 | Ht 69.5 in | Wt 137.0 lb

## 2018-11-28 DIAGNOSIS — I5020 Unspecified systolic (congestive) heart failure: Secondary | ICD-10-CM

## 2018-11-28 DIAGNOSIS — I517 Cardiomegaly: Secondary | ICD-10-CM

## 2018-11-28 DIAGNOSIS — I5032 Chronic diastolic (congestive) heart failure: Secondary | ICD-10-CM | POA: Diagnosis not present

## 2018-11-28 DIAGNOSIS — I5082 Biventricular heart failure: Secondary | ICD-10-CM

## 2018-11-28 DIAGNOSIS — I272 Pulmonary hypertension, unspecified: Secondary | ICD-10-CM

## 2018-11-28 NOTE — Progress Notes (Signed)
Virtual Visit via Telephone Note   This visit type was conducted due to national recommendations for restrictions regarding the COVID-19 Pandemic (e.g. social distancing) in an effort to limit this patient's exposure and mitigate transmission in our community.  Due to his co-morbid illnesses, this patient is at least at moderate risk for complications without adequate follow up.  This format is felt to be most appropriate for this patient at this time.  The patient did not have access to video technology/had technical difficulties with video requiring transitioning to audio format only (telephone).  All issues noted in this document were discussed and addressed.  No physical exam could be performed with this format.  Please refer to the patient's chart for his  consent to telehealth for [facility]   Evaluation Performed:  Follow-up visit  Date:  11/28/2018   ID:  Derrick [name], Derrick [name] November 03, 1950, MRN [id number]  Patient Location: Home Provider Location: Home  PCP:  Gareth Morgan, MD  Cardiologist:  Dina Rich, MD  Electrophysiologist:  None   Chief Complaint:  Hospital follow up  History of Present Illness:    VERNELL GATES is a 68 y.o. male seen for the following medical problems  1. Dyspnea - echo 06/2016 LVEF 45-50%, diffuse hypokinesis, cannot eval diastolic function. Moderate AI, mild MR. PASP 50 (not 150) - severe LVH. Septum 20 mm, posterior wall 18 mm - cardiac MRI 12/2016: moderate LV septal thickness 15 mm, posterior wall 9 mm. No SAM or LVOT gradient, gadolinium pattern not consistent with hypertrophic CM but consider amyloid. Moderate AI. Normal RV size and function. Normal atria. - UPEP/SPEP overall unremarkable.  -  2. COPD - poor compliance with inhalers.   - some recent cough, congestion, wheezing  - admitted 10/2018 with COPD exacerbation, hypoxia - discharged on O2 2L [place], now only using as needed - since discharge breahting improved.  - compliant  with inhalers, difficultly affording QVAR, pcp working on  3. Chronic diastolic HF - during 10/2018 thought to be volume overloaded, Diursed, discharged on lasix 40mg  daily - 10/2018 echo LVEF 60-65%, grade I diastolic dysfunction, severe asymmetric LVH. RV pressure and volume overload, severe RV dysfunction. RVSP 56, mod TR, mild AS, mod AI  - no recent edema since discharge - has cut back on salt. Takes lasix 20mg . Home weights stable,   4. Pulmonary hypertension with RV failure - repeat echo 05/2017 with PASP 88. Could not evaluate diastolic function, normal LA size would argue against significant dysfunction - he does have history of COPD, rheumatoid arthritis as well. +ANA previously - he has refused RHC repeatedly    5. NSVT - wide complex rhythm during admission 07/2017 reported as NSVT, does not appear patient evaluated by cardiology at the time. Longest run reported at 30 beats. Thoughts due to use of beta agonists for his COPD exacberation. Started on metoprolol.    - no recent palitations.        The patient does not have symptoms concerning for COVID-19 infection (fever, chills, cough, or new shortness of breath).    Past Medical History:  Diagnosis Date  . Allergic rhinitis due to pollen 09/08/2009  . CHF (congestive heart failure) (HCC)   . COPD (chronic obstructive pulmonary disease) (HCC) 03/14/2013  . 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 Family history of diabetes mellitus 09/08/2009   father  . Family history of stroke (cerebrovascular) 09/08/2009   father  . Nicotine dependence, chewing tobacco, uncomplicated  08/07/1968  . Other disturbances of skin sensation 11/08/2012  . Perennial allergic rhinitis with seasonal variation 11/08/2012  . RA (rheumatoid arthritis) (HCC) 09/08/2009   Past Surgical History:  Procedure Laterality Date  . DENTAL SURGERY       No outpatient medications have been marked as taking for the  11/28/18 encounter (Appointment) with Antoine Poche, MD.     Allergies:   Patient has no known allergies.   Social History   Tobacco Use  . Smoking status: Former Smoker    Types: Cigarettes  . Smokeless tobacco: Current User    Types: Chew  . Tobacco comment: has chewed tobacco for 40+ years  Substance Use Topics  . Alcohol use: No  . Drug use: No     Family Hx: The patient's family history includes CVA in his father; Heart attack in his brother; Hypertension in his mother.  ROS:   Please see the history of present illness.     All other systems reviewed and are negative.   Prior CV studies:   The following studies were reviewed today:  12/2016 Cardiac MRI FINDINGS: Both atria were normal in size. RV was normal in size and function. There was no ASD/VSD or pericardial effusion. The aortic root was mildly dilatated at 3.9 cm The LV was normal in size The septum was 15 mm in thickness with the posterior wall being 9 mm. There was no SAM or LVOT gradient. There was moderate appearing AR. The RV was normal in size and function. The quantitative EF was 61% (EDV 120 cc ESV 46 cc SV 74 cc) Delayed enhancement images showed poor nulling of the myocardium suggesting possibility of amyloid or infiltrative DCM. There was no scar.  Note poor quality study due to motion and ectopy Most sequences done with free breathing  IMPRESSION: 1) Moderate LVH septal thickness 15 mm compared to posterior wall 9 mm. No SAM or LVOT gradient. No delayed hyperenhancement to suggest HOCM  2) Failure to null myocardium post gadolinium suggesting possibility of amyloid  3) Tri-leaflet aortic valve with moderate appearing AR  4) Mild aortic root enlargement 3.9 cm  5) Normal RV size and function  6) No pericardial effusion  70 Normal LV size quantitative EF 61% no infarct or scar on delayed post gadolinium inversion recovery sequences  [name]  06/2017 echo Study  Conclusions  - Left ventricle: Abnormal septal motion The cavity size was mildly dilated. Wall thickness was increased in a pattern of severe LVH. Systolic function was mildly reduced. The estimated ejection fraction was in the range of 45% to 50%. Diffuse hypokinesis. The study is not technically sufficient to allow evaluation of LV diastolic function. - Aortic valve: There was moderate regurgitation. Valve area (VTI): 3.13 cm^2. Valve area (Vmax): 2.91 cm^2. Valve area (Vmean): 2.94 cm^2. - Mitral valve: There was mild regurgitation. - Left atrium: The atrium was mildly dilated. - Right atrium: The atrium was mildly dilated. - Atrial septum: No defect or patent foramen ovale was identified. - Pulmonary arteries: PA peak pressure: 62 mm Hg (S). - Impressions: Tech entered wrong estimate of CVP Estimated PA pressure only 62 mmHg.  Impressions:  - Tech entered wrong estimate of CVP Estimated PA pressure only 62 mmHg.    05/2017 echo Study Conclusions  - Left ventricle: The cavity size was normal. Wall thickness was increased in a pattern of severe LVH. Systolic function was normal. The estimated ejection fraction was in the range of 60% to 65%.  Wall motion was normal; there were no regional wall motion abnormalities. Diastolic dysfunction, grade indeterminate. Indeterminate filling pressures. - Aortic valve: Trileaflet; normal thickness leaflets. There was moderate regurgitation. - Aorta: Mild aortic root dilatation. Aortic root dimension: 42 mm (ED). - Mitral valve: There was mild regurgitation. - Right ventricle: The cavity size was moderately dilated. Systolic function was moderately reduced. - Right atrium: The atrium was moderately to severely dilated. - Atrial septum: No defect or patent foramen ovale was identified. - Tricuspid valve: There was mild-moderate regurgitation. - Pulmonary arteries: Systolic pressure was severely  increased. PA peak pressure: 88 mm Hg (S).    09/2018 echo IMPRESSIONS    1. The left ventricle has normal systolic function with an ejection fraction of 60-65%. The cavity size was normal. There is severe asymmetric left ventricular hypertrophy. Left ventricular diastolic Doppler parameters are consistent with impaired  relaxation There is right ventricular volume and pressure overload.  2. The right ventricle has severely reduced systolic function. The cavity was severely enlarged. There is mildly increased right ventricular wall thickness. Right ventricular systolic pressure is moderately elevated with an estimated pressure of 55.8  mmHg.  3. Right atrial size was severely dilated.  4. Small pericardial effusion.  5. The pericardial effusion is circumferential.  6. The mitral valve is normal in structure.  7. The tricuspid valve is normal in structure. Tricuspid valve regurgitation is moderate.  8. The aortic valve is tricuspid Mild thickening of the aortic valve Aortic valve regurgitation is moderate by color flow Doppler. mild stenosis of the aortic valve.  9. There is mild dilatation of the aortic root. 10. Pulmonary hypertension is moderate. 11. The inferior vena cava was dilated in size with <50% respiratory variability.  Labs/Other Tests and Data Reviewed:    EKG:  na  Recent Labs: 10/04/2018: ALT 31; B Natriuretic Peptide 1,874.0 10/09/2018: BUN 19; Creatinine, Ser 0.68; Hemoglobin 15.3; Magnesium 1.7; Platelets 111; Potassium 3.7; Sodium 142   Recent Lipid Panel No results found for: CHOL, TRIG, HDL, CHOLHDL, LDLCALC, LDLDIRECT  Wt Readings from Last 3 Encounters:  10/10/18 129 lb 6.6 oz (58.7 kg)  12/03/17 135 lb 12.8 oz (61.6 kg)  07/06/17 140 lb (63.5 kg)     Objective:    Vital Signs:  There were no vitals taken for this visit.   Normal affect. Normal speech pattern and tone. Comfortable, no distresss. No audible sounds of SOB or wheezing.   ASSESSMENT &  PLAN:    1. LVH/Chronic diastolic HF - moderate asymmetric hypertrophy, MRI findings not consistent with hypertrophic CM. Pattern questionable for amyloid, SPEPand UPEP overall unremarkable.  - LVEF has since normalized. Main issue now is diastolic dysfunction and RV failure  2. Pulmonary HTN with RV failure -severe by echo. History of COPD and rheumatoid arthtitis - I have strongly recommended RHC for patient, he has continously refused - continue COPD management at this time, once COVID-19 risks decrease rediscuss possible RHC     COVID-19 Education: The signs and symptoms of COVID-19 were discussed with the patient and how to seek care for testing (follow up with PCP or arrange E-visit).  The importance of social distancing was discussed today.  Time:   Today, I have spent 28 minutes with the patient with telehealth technology discussing the above problems.     Medication Adjustments/Labs and Tests Ordered: Current medicines are reviewed at length with the patient today.  Concerns regarding medicines are outlined above.   Tests Ordered: No orders of the  defined types were placed in this encounter.   Medication Changes: No orders of the defined types were placed in this encounter.   Disposition:  Follow up 2 months  Signed, Dina RichBranch, Ondria Oswald, MD  11/28/2018 12:40 PM    [facility]

## 2018-11-28 NOTE — Progress Notes (Signed)
Medication Instructions:  Your physician recommends that you continue on your current medications as directed. Please refer to the Current Medication list given to you today.  Labwork: none  Testing/Procedures: none  Follow-Up: Your physician recommends that you schedule a follow-up appointment in: 2 months  Any Other Special Instructions Will Be Listed Below (If Applicable).  If you need a refill on your cardiac medications before your next appointment, please call your pharmacy. 

## 2019-01-10 IMAGING — CR DG CHEST 1V PORT
1 series · 1 of 1 positions shown · non-contrast
Comparison: 06/25/2017

CLINICAL DATA: Shortness of breath, wheezing x1 week

EXAM:
PORTABLE CHEST 1 VIEW

[ap portable]
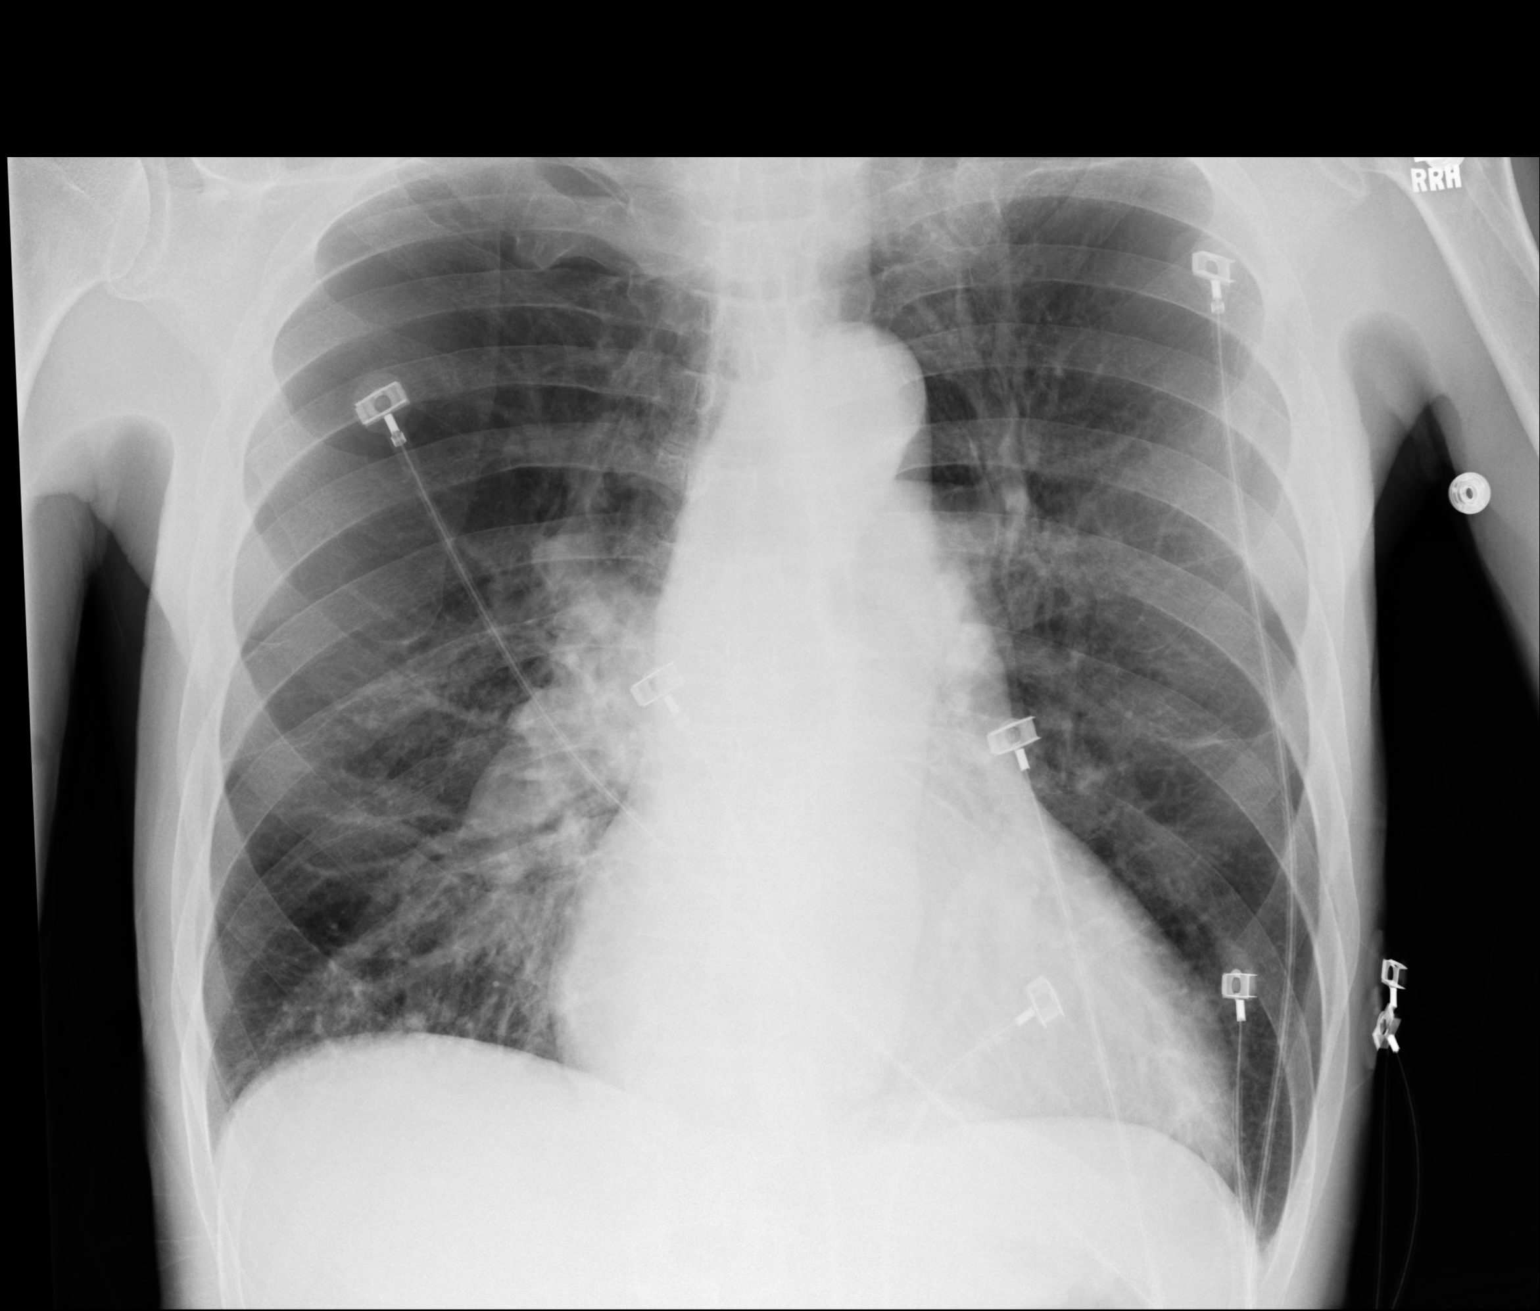

[1 of 1 positions shown; findings below may reference images not displayed]

FINDINGS: Mild right basilar opacity, atelectasis versus pneumonia. Left lung
is essentially clear. No pleural effusion or pneumothorax.

Heart is normal in size. Right perihilar prominence, likely
vascular.
IMPRESSION: Mild right basilar opacity, atelectasis versus pneumonia.

## 2019-02-13 ENCOUNTER — Other Ambulatory Visit: Payer: Self-pay

## 2019-02-13 ENCOUNTER — Encounter: Payer: Self-pay | Admitting: *Deleted

## 2019-02-13 ENCOUNTER — Encounter: Payer: Self-pay | Admitting: Cardiology

## 2019-02-13 ENCOUNTER — Ambulatory Visit (INDEPENDENT_AMBULATORY_CARE_PROVIDER_SITE_OTHER): Payer: Medicare HMO | Admitting: Cardiology

## 2019-02-13 VITALS — BP 100/68 | HR 74 | Temp 99.1°F | Ht 71.5 in | Wt 126.0 lb

## 2019-02-13 DIAGNOSIS — I272 Pulmonary hypertension, unspecified: Secondary | ICD-10-CM

## 2019-02-13 DIAGNOSIS — I517 Cardiomegaly: Secondary | ICD-10-CM | POA: Diagnosis not present

## 2019-02-13 DIAGNOSIS — I5032 Chronic diastolic (congestive) heart failure: Secondary | ICD-10-CM | POA: Diagnosis not present

## 2019-02-13 NOTE — Patient Instructions (Addendum)
Your physician recommends that you schedule a follow-up appointment in: 4 MONTHS WITH DR BRANCH  Your physician recommends that you continue on your current medications as directed. Please refer to the Current Medication list given to you today.  Thank you for choosing [facility]!!    

## 2019-02-13 NOTE — Progress Notes (Signed)
Clinical Summary Derrick [name] is a 67 y.o.male seen for the following medical problems  1. Dyspnea - echo 06/2016 LVEF 45-50%, diffuse hypokinesis, cannot eval diastolic function. Moderate AI, mild MR. PASP 50 (not 150) - severe LVH. Septum 20 mm, posterior wall 18 mm - cardiac MRI 12/2016: moderate LV septal thickness 15 mm, posterior wall 9 mm. No SAM or LVOT gradient, gadolinium pattern not consistent with hypertrophic CM but consider amyloid. Moderate AI. Normal RV size and function. Normal atria. - UPEP/SPEP overall unremarkable.  - breathing has been improving - some LE edema about 2 weeks ago that has resolved. Taking lasix 40mg  daily.    2. COPD - no recent cough or wheezign. Compliant with inhalers. Uses O2 at night  3. Chronic diastolic HF - during 10/2018 thought to be volume overloaded, Diursed, discharged on lasix 40mg  daily - 10/2018 echo LVEF 60-65%, grade I diastolic dysfunction, severe asymmetric LVH. RV pressure and volume overload, severe RV dysfunction. RVSP 56, mod TR, mild AS, mod AI  - no recent edema, no significant SOB or DOE  4. Pulmonary hypertension with RV failure - echo 05/2017 with PASP 88. Could not evaluate diastolic function, normal LA size would argue against significant dysfunction - 09/2018 echo LVEF 60-65%, RV pressure and volume overload, severe RV dysfunction and enlargement, RVSP 56 mmHg, mod TR - he does have history of COPD, rheumatoid arthritis as well. +ANA previously - he has refused RHC repeatedly  - remains uniterested in RHC   5. NSVT - wide complex rhythm during admission 07/2017 reported as NSVT, does not appear patient evaluated by cardiology at the time. Longest run reported at 30 beats. Thoughts due to use of beta agonists for his COPD exacberation. Started on metoprolol.  - denies any palpitations     Past Medical History:  Diagnosis Date  . Allergic rhinitis due to pollen 09/08/2009  . CHF (congestive  heart failure) (HCC)   . COPD (chronic obstructive pulmonary disease) (HCC) 03/14/2013  . 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 Family history of diabetes mellitus 09/08/2009   father  . Family history of stroke (cerebrovascular) 09/08/2009   father  . Nicotine dependence, chewing tobacco, uncomplicated 08/07/1968  . Other disturbances of skin sensation 11/08/2012  . Perennial allergic rhinitis with seasonal variation 11/08/2012  . RA (rheumatoid arthritis) (HCC) 09/08/2009     No Known Allergies   Current Outpatient Medications  Medication Sig Dispense Refill  . albuterol (PROVENTIL HFA;VENTOLIN HFA) 108 (90 Base) MCG/ACT inhaler Inhale 1-2 puffs into the lungs every 6 (six) hours as needed for wheezing or shortness of breath. 1 Inhaler 3  . albuterol (PROVENTIL) (2.5 MG/3ML) 0.083% nebulizer solution Take 3 mLs (2.5 mg total) by nebulization every 4 (four) hours as needed for wheezing or shortness of breath. 75 mL 11  . carvedilol (COREG) 3.125 MG tablet Take 1 tablet (3.125 mg total) by mouth 2 (two) times daily. 180 tablet 3  . cephALEXin (KEFLEX) 250 MG capsule Take 1 capsule (250 mg total) by mouth 3 (three) times daily. 21 capsule 0  . CVS VITAMIN B12 1000 MCG tablet TAKE 2 TABLETS EACH DAY FOR VITAMIN B12 DEFICIENCY.  11  . fluticasone (FLONASE) 50 MCG/ACT nasal spray Place 1 spray into both nostrils daily. 16 g 11  . furosemide (LASIX) 40 MG tablet 1 (40 mg) Tab po qam and half- Tab (20 mg) qpm (Patient taking differently: 20 mg. 1 (40 mg) Tab po qam  and half- Tab (20 mg) qpm) 135 tablet 1  . lisinopril (PRINIVIL,ZESTRIL) 2.5 MG tablet Take 1 tablet (2.5 mg total) by mouth daily. 90 tablet 3  . potassium chloride SA (KLOR-CON M20) 20 MEQ tablet Take 1 tablet (20 mEq total) by mouth daily. 90 tablet 3  . QVAR 80 MCG/ACT inhaler Inhale 1 puff into the lungs daily. 1 Inhaler 11   No current facility-administered medications for this visit.       Past Surgical History:  Procedure Laterality Date  . DENTAL SURGERY       No Known Allergies    Family History  Problem Relation Age of Onset  . Hypertension Mother   . CVA Father   . Heart attack Brother      Social History Mr. Congrove reports that he has quit smoking. His smoking use included cigarettes. His smokeless tobacco use includes chew. Mr. Mahler reports no history of alcohol use.   Review of Systems CONSTITUTIONAL: No weight loss, fever, chills, weakness or fatigue.  HEENT: Eyes: No visual loss, blurred vision, double vision or yellow sclerae.No hearing loss, sneezing, congestion, runny nose or sore throat.  SKIN: No rash or itching.  CARDIOVASCULAR: per hpi RESPIRATORY: No shortness of breath, cough or sputum.  GASTROINTESTINAL: No anorexia, nausea, vomiting or diarrhea. No abdominal pain or blood.  GENITOURINARY: No burning on urination, no polyuria NEUROLOGICAL: No headache, dizziness, syncope, paralysis, ataxia, numbness or tingling in the extremities. No change in bowel or bladder control.  MUSCULOSKELETAL: No muscle, back pain, joint pain or stiffness.  LYMPHATICS: No enlarged nodes. No history of splenectomy.  PSYCHIATRIC: No history of depression or anxiety.  ENDOCRINOLOGIC: No reports of sweating, cold or heat intolerance. No polyuria or polydipsia.  Marland Kitchen   Physical Examination Today's Vitals   02/13/19 1257  BP: 100/68  Pulse: 74  Temp: 99.1 F (37.3 C)  SpO2: 91%  Weight: 126 lb (57.2 kg)  Height: 5' 11.5" (1.816 m)   Body mass index is 17.33 kg/m.  Gen: resting comfortably, no acute distress HEENT: no scleral icterus, pupils equal round and reactive, no palptable cervical adenopathy,  CV: RRR, no m/r/g, no jvd Resp: Clear to auscultation bilaterally GI: abdomen is soft, non-tender, non-distended, normal bowel sounds, no hepatosplenomegaly MSK: extremities are warm, no edema.  Skin: warm, no rash Neuro:  no focal deficits Psych: appropriate  affect   Diagnostic Studies  12/2016 Cardiac MRI FINDINGS: Both atria were normal in size. RV was normal in size and function. There was no ASD/VSD or pericardial effusion. The aortic root was mildly dilatated at 3.9 cm The LV was normal in size The septum was 15 mm in thickness with the posterior wall being 9 mm. There was no SAM or LVOT gradient. There was moderate appearing AR. The RV was normal in size and function. The quantitative EF was 61% (EDV 120 cc ESV 46 cc SV 74 cc) Delayed enhancement images showed poor nulling of the myocardium suggesting possibility of amyloid or infiltrative DCM. There was no scar.  Note poor quality study due to motion and ectopy Most sequences done with free breathing  IMPRESSION: 1) Moderate LVH septal thickness 15 mm compared to posterior wall 9 mm. No SAM or LVOT gradient. No delayed hyperenhancement to suggest HOCM  2) Failure to null myocardium post gadolinium suggesting possibility of amyloid  3) Tri-leaflet aortic valve with moderate appearing AR  4) Mild aortic root enlargement 3.9 cm  5) Normal RV size and function  6) No  pericardial effusion  70 Normal LV size quantitative EF 61% no infarct or scar on delayed post gadolinium inversion recovery sequences  Charlton Hawseter Nishan  06/2017 echo Study Conclusions  - Left ventricle: Abnormal septal motion The cavity size was mildly dilated. Wall thickness was increased in a pattern of severe LVH. Systolic function was mildly reduced. The estimated ejection fraction was in the range of 45% to 50%. Diffuse hypokinesis. The study is not technically sufficient to allow evaluation of LV diastolic function. - Aortic valve: There was moderate regurgitation. Valve area (VTI): 3.13 cm^2. Valve area (Vmax): 2.91 cm^2. Valve area (Vmean): 2.94 cm^2. - Mitral valve: There was mild regurgitation. - Left atrium: The atrium was mildly dilated. - Right atrium: The atrium  was mildly dilated. - Atrial septum: No defect or patent foramen ovale was identified. - Pulmonary arteries: PA peak pressure: 62 mm Hg (S). - Impressions: Tech entered wrong estimate of CVP Estimated PA pressure only 62 mmHg.  Impressions:  - Tech entered wrong estimate of CVP Estimated PA pressure only 62 mmHg.    05/2017 echo Study Conclusions  - Left ventricle: The cavity size was normal. Wall thickness was increased in a pattern of severe LVH. Systolic function was normal. The estimated ejection fraction was in the range of 60% to 65%. Wall motion was normal; there were no regional wall motion abnormalities. Diastolic dysfunction, grade indeterminate. Indeterminate filling pressures. - Aortic valve: Trileaflet; normal thickness leaflets. There was moderate regurgitation. - Aorta: Mild aortic root dilatation. Aortic root dimension: 42 mm (ED). - Mitral valve: There was mild regurgitation. - Right ventricle: The cavity size was moderately dilated. Systolic function was moderately reduced. - Right atrium: The atrium was moderately to severely dilated. - Atrial septum: No defect or patent foramen ovale was identified. - Tricuspid valve: There was mild-moderate regurgitation. - Pulmonary arteries: Systolic pressure was severely increased. PA peak pressure: 88 mm Hg (S).    09/2018 echo IMPRESSIONS   1. The left ventricle has normal systolic function with an ejection fraction of 60-65%. The cavity size was normal. There is severe asymmetric left ventricular hypertrophy. Left ventricular diastolic Doppler parameters are consistent with impaired  relaxation There is right ventricular volume and pressure overload. 2. The right ventricle has severely reduced systolic function. The cavity was severely enlarged. There is mildly increased right ventricular wall thickness. Right ventricular systolic pressure is moderately elevated with an estimated  pressure of 55.8  mmHg. 3. Right atrial size was severely dilated. 4. Small pericardial effusion. 5. The pericardial effusion is circumferential. 6. The mitral valve is normal in structure. 7. The tricuspid valve is normal in structure. Tricuspid valve regurgitation is moderate. 8. The aortic valve is tricuspid Mild thickening of the aortic valve Aortic valve regurgitation is moderate by color flow Doppler. mild stenosis of the aortic valve. 9. There is mild dilatation of the aortic root. 10. Pulmonary hypertension is moderate. 11. The inferior vena cava was dilated in size with <50% respiratory variability.    Assessment and Plan   1. LVH/Chronic diastolic HF - moderate asymmetric hypertrophy, MRI findings not consistent with hypertrophic CM. Pattern questionable for amyloid, SPEPand UPEP overall unremarkable.  - LVEF has since normalized  - no recent edema, appears euvolemic by exam - continue current diuretics.   2. Pulmonary HTN with RV failure -severe by echo. History of COPD and rheumatoid arthtitis - I have strongly recommended RHC for patient, he has continously refused and continues to refuse today - continue to monitor at  this time       Antoine Poche, M.D.,

## 2019-03-20 ENCOUNTER — Encounter (HOSPITAL_COMMUNITY): Payer: Self-pay

## 2019-03-20 ENCOUNTER — Emergency Department (HOSPITAL_COMMUNITY): Payer: Medicare HMO

## 2019-03-20 ENCOUNTER — Inpatient Hospital Stay (HOSPITAL_COMMUNITY): Payer: Medicare HMO

## 2019-03-20 ENCOUNTER — Inpatient Hospital Stay (HOSPITAL_COMMUNITY)
Admission: EM | Admit: 2019-03-20 | Discharge: 2019-03-31 | DRG: 199 | Disposition: A | Discharge status: Home Health | Payer: Medicare HMO | Attending: Internal Medicine | Admitting: Internal Medicine

## 2019-03-20 DIAGNOSIS — J930 Spontaneous tension pneumothorax: Secondary | ICD-10-CM | POA: Diagnosis present

## 2019-03-20 DIAGNOSIS — Z681 Body mass index (BMI) 19 or less, adult: Secondary | ICD-10-CM

## 2019-03-20 DIAGNOSIS — Z79899 Other long term (current) drug therapy: Secondary | ICD-10-CM | POA: Diagnosis not present

## 2019-03-20 DIAGNOSIS — Z9689 Presence of other specified functional implants: Secondary | ICD-10-CM | POA: Diagnosis not present

## 2019-03-20 DIAGNOSIS — Z938 Other artificial opening status: Secondary | ICD-10-CM

## 2019-03-20 DIAGNOSIS — J9383 Other pneumothorax: Secondary | ICD-10-CM | POA: Diagnosis not present

## 2019-03-20 DIAGNOSIS — R0602 Shortness of breath: Secondary | ICD-10-CM | POA: Diagnosis present

## 2019-03-20 DIAGNOSIS — Z9981 Dependence on supplemental oxygen: Secondary | ICD-10-CM | POA: Diagnosis not present

## 2019-03-20 DIAGNOSIS — J9311 Primary spontaneous pneumothorax: Secondary | ICD-10-CM | POA: Diagnosis not present

## 2019-03-20 DIAGNOSIS — Z7189 Other specified counseling: Secondary | ICD-10-CM | POA: Diagnosis not present

## 2019-03-20 DIAGNOSIS — R531 Weakness: Secondary | ICD-10-CM | POA: Diagnosis not present

## 2019-03-20 DIAGNOSIS — J449 Chronic obstructive pulmonary disease, unspecified: Secondary | ICD-10-CM | POA: Diagnosis present

## 2019-03-20 DIAGNOSIS — Z4682 Encounter for fitting and adjustment of non-vascular catheter: Secondary | ICD-10-CM

## 2019-03-20 DIAGNOSIS — R0600 Dyspnea, unspecified: Secondary | ICD-10-CM

## 2019-03-20 DIAGNOSIS — Z515 Encounter for palliative care: Secondary | ICD-10-CM

## 2019-03-20 DIAGNOSIS — J9601 Acute respiratory failure with hypoxia: Secondary | ICD-10-CM | POA: Diagnosis present

## 2019-03-20 DIAGNOSIS — J939 Pneumothorax, unspecified: Secondary | ICD-10-CM | POA: Diagnosis not present

## 2019-03-20 DIAGNOSIS — E43 Unspecified severe protein-calorie malnutrition: Secondary | ICD-10-CM | POA: Diagnosis present

## 2019-03-20 DIAGNOSIS — I5032 Chronic diastolic (congestive) heart failure: Secondary | ICD-10-CM | POA: Diagnosis present

## 2019-03-20 DIAGNOSIS — Z7951 Long term (current) use of inhaled steroids: Secondary | ICD-10-CM

## 2019-03-20 DIAGNOSIS — M069 Rheumatoid arthritis, unspecified: Secondary | ICD-10-CM | POA: Diagnosis present

## 2019-03-20 DIAGNOSIS — I110 Hypertensive heart disease with heart failure: Secondary | ICD-10-CM | POA: Diagnosis present

## 2019-03-20 DIAGNOSIS — F17220 Nicotine dependence, chewing tobacco, uncomplicated: Secondary | ICD-10-CM | POA: Diagnosis present

## 2019-03-20 DIAGNOSIS — I272 Pulmonary hypertension, unspecified: Secondary | ICD-10-CM | POA: Diagnosis present

## 2019-03-20 DIAGNOSIS — Z20828 Contact with and (suspected) exposure to other viral communicable diseases: Secondary | ICD-10-CM | POA: Diagnosis present

## 2019-03-20 DIAGNOSIS — Z66 Do not resuscitate: Secondary | ICD-10-CM | POA: Diagnosis present

## 2019-03-20 DIAGNOSIS — R0902 Hypoxemia: Secondary | ICD-10-CM

## 2019-03-20 LAB — BASIC METABOLIC PANEL
Anion gap: 10 (ref 5–15)
BUN: 22 mg/dL (ref 8–23)
CO2: 31 mmol/L (ref 22–32)
Calcium: 9.3 mg/dL (ref 8.9–10.3)
Chloride: 98 mmol/L (ref 98–111)
Creatinine, Ser: 1.01 mg/dL (ref 0.61–1.24)
GFR calc Af Amer: >60 mL/min (ref >60)
GFR calc non Af Amer: >60 mL/min (ref >60)
Glucose, Bld: 99 mg/dL (ref 70–99)
Potassium: 4.6 mmol/L (ref 3.5–5.1)
Sodium: 139 mmol/L (ref 135–145)

## 2019-03-20 LAB — CBC WITH DIFFERENTIAL/PLATELET
Abs Immature Granulocytes: 0.01 10*3/uL (ref 0.00–0.07)
Basophils Absolute: 0 10*3/uL (ref 0.0–0.1)
Basophils Relative: 1 %
Eosinophils Absolute: 0.1 10*3/uL (ref 0.0–0.5)
Eosinophils Relative: 2 %
HCT: 52.8 % — ABNORMAL HIGH (ref 39.0–52.0)
Hemoglobin: 16.1 g/dL (ref 13.0–17.0)
Immature Granulocytes: 0 %
Lymphocytes Relative: 21 %
Lymphs Abs: 1 10*3/uL (ref 0.7–4.0)
MCH: 31.3 pg (ref 26.0–34.0)
MCHC: 30.5 g/dL (ref 30.0–36.0)
MCV: 102.7 fL — ABNORMAL HIGH (ref 80.0–100.0)
Monocytes Absolute: 0.5 10*3/uL (ref 0.1–1.0)
Monocytes Relative: 11 %
Neutro Abs: 3 10*3/uL (ref 1.7–7.7)
Neutrophils Relative %: 65 %
Platelets: 133 10*3/uL — ABNORMAL LOW (ref 150–400)
RBC: 5.14 MIL/uL (ref 4.22–5.81)
RDW: 12.2 % (ref 11.5–15.5)
WBC: 4.6 10*3/uL (ref 4.0–10.5)
nRBC: 0 % (ref 0.0–0.2)

## 2019-03-20 LAB — BRAIN NATRIURETIC PEPTIDE: B Natriuretic Peptide: 75 pg/mL (ref 0.0–100.0)

## 2019-03-20 LAB — TROPONIN I (HIGH SENSITIVITY)
Troponin I (High Sensitivity): 11 ng/L (ref <18)
Troponin I (High Sensitivity): 14 ng/L (ref <18)

## 2019-03-20 LAB — SARS CORONAVIRUS 2 BY RT PCR (HOSPITAL ORDER, PERFORMED IN ~~LOC~~ HOSPITAL LAB): SARS Coronavirus 2: NEGATIVE

## 2019-03-20 MED ORDER — LIDOCAINE HCL (PF) 1 % IJ SOLN
0.0000 mL | Freq: Once | INTRAMUSCULAR | Status: AC | PRN
  Administered 2019-03-20: 30 mL via INTRADERMAL
  Filled 2019-03-20: qty 30

## 2019-03-20 MED ORDER — FENTANYL CITRATE (PF) 100 MCG/2ML IJ SOLN
INTRAMUSCULAR | Status: AC
  Filled 2019-03-20: qty 2

## 2019-03-20 MED ORDER — POLYETHYLENE GLYCOL 3350 17 G PO PACK: 17.0000 g | PACK | Freq: Every day | ORAL | Status: DC | PRN

## 2019-03-20 MED ORDER — IPRATROPIUM BROMIDE HFA 17 MCG/ACT IN AERS
2.0000 | INHALATION_SPRAY | Freq: Once | RESPIRATORY_TRACT | Status: DC
  Filled 2019-03-20: qty 12.9

## 2019-03-20 MED ORDER — FLUTICASONE PROPIONATE 50 MCG/ACT NA SUSP
1.0000 | Freq: Every day | NASAL | Status: DC
  Administered 2019-03-21 – 2019-03-31 (×10): 1 via NASAL
  Filled 2019-03-20: qty 16

## 2019-03-20 MED ORDER — ALBUTEROL SULFATE HFA 108 (90 BASE) MCG/ACT IN AERS
4.0000 | INHALATION_SPRAY | Freq: Once | RESPIRATORY_TRACT | Status: AC
  Administered 2019-03-20: 4 via RESPIRATORY_TRACT
  Filled 2019-03-20: qty 6.7

## 2019-03-20 MED ORDER — FENTANYL CITRATE (PF) 100 MCG/2ML IJ SOLN
50.0000 ug | Freq: Once | INTRAMUSCULAR | Status: AC
  Administered 2019-03-20: 15:00:00 50 ug via INTRAVENOUS

## 2019-03-20 MED ORDER — ACETAMINOPHEN 650 MG RE SUPP: 650.0000 mg | Freq: Four times a day (QID) | RECTAL | Status: DC | PRN

## 2019-03-20 MED ORDER — ONDANSETRON HCL 4 MG/2ML IJ SOLN
4.0000 mg | Freq: Four times a day (QID) | INTRAMUSCULAR | Status: DC | PRN
  Filled 2019-03-20: qty 2

## 2019-03-20 MED ORDER — ACETAMINOPHEN 325 MG PO TABS
650.0000 mg | ORAL_TABLET | Freq: Four times a day (QID) | ORAL | Status: DC | PRN
  Administered 2019-03-21 – 2019-03-26 (×2): 650 mg via ORAL
  Filled 2019-03-20 (×2): qty 2

## 2019-03-20 MED ORDER — ONDANSETRON HCL 4 MG PO TABS: 4.0000 mg | ORAL_TABLET | Freq: Four times a day (QID) | ORAL | Status: DC | PRN

## 2019-03-20 MED ORDER — ALBUTEROL SULFATE (2.5 MG/3ML) 0.083% IN NEBU: 2.5000 mg | INHALATION_SOLUTION | Freq: Four times a day (QID) | RESPIRATORY_TRACT | Status: DC | PRN

## 2019-03-20 NOTE — ED Notes (Signed)
Pt had pigtail chest tube place in the right chest wall without any trouble. Chest tube placed to suction no leak detected. Dr Roslynn Amble place dressing at site of insert.

## 2019-03-20 NOTE — ED Notes (Signed)
Report given to [facility] 

## 2019-03-20 NOTE — ED Triage Notes (Signed)
Pt mowing grass and became SOB and drove himself to store EMS arrive he was 74 % on room air. Given 2 albuterol and solumedrol by EMS. Says he wears Oxygen prn

## 2019-03-20 NOTE — ED Notes (Signed)
Dr Maudie Mercury notified of O2 sat gradual decline, physician at bedside for assessment

## 2019-03-20 NOTE — ED Provider Notes (Signed)
[facility]GENCY DEPARTMENT Provider Note   CSN: [id number] Arrival date & time: 03/20/19  1401     History   Chief Complaint Chief Complaint  Patient presents with  . Shortness of Breath    HPI Derrick [name] is a 68 y.o. male.  Presents emergency department after chief complaint sudden onset shortness of breath.  Patient reported that he he was mowing his grass when he had a sudden onset of shortness of breath.  States prior to this he was not having any issues and felt like his normal self earlier today.  Normally wears 2 to 3 L nasal cannula at home.  EMS gave patient albuterol, Solu-Medrol.  74% on room air when EMS arrived.  Patient denies any associated chest pain.  Denies prior history pneumothoraces.  Shortness of breath worsened with exertion, improves with rest.  No associated cough, fever.     HPI  Past Medical History:  Diagnosis Date  . Allergic rhinitis due to pollen 09/08/2009  . CHF (congestive heart failure) (HCC)   . COPD (chronic obstructive pulmonary disease) (HCC) 03/14/2013  . 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 Family history of diabetes mellitus 09/08/2009   father  . Family history of stroke (cerebrovascular) 09/08/2009   father  . Nicotine dependence, chewing tobacco, uncomplicated 08/07/1968  . Other disturbances of skin sensation 11/08/2012  . Perennial allergic rhinitis with seasonal variation 11/08/2012  . RA (rheumatoid arthritis) (HCC) 09/08/2009    Patient Active Problem List   Diagnosis Date Noted  . Acute hypoxemic respiratory failure (HCC) 10/04/2018  . Cellulitis 10/04/2018  . Acute on chronic diastolic CHF (congestive heart failure) (HCC) 10/04/2018  . NSVT (nonsustained ventricular tachycardia) (HCC) 07/08/2017  . Pulmonary HTN (HCC) 07/08/2017  . COPD exacerbation (HCC) 07/06/2017    Past Surgical History:  Procedure Laterality Date  . DENTAL SURGERY          Home Medications     Prior to Admission medications   Medication Sig Start Date End Date Taking? Authorizing Provider  albuterol (PROVENTIL HFA;VENTOLIN HFA) 108 (90 Base) MCG/ACT inhaler Inhale 1-2 puffs into the lungs every 6 (six) hours as needed for wheezing or shortness of breath. 10/10/18   Shon HaleEmokpae, Courage, MD  albuterol (PROVENTIL) (2.5 MG/3ML) 0.083% nebulizer solution Take 3 mLs (2.5 mg total) by nebulization every 4 (four) hours as needed for wheezing or shortness of breath. 10/10/18   Shon HaleEmokpae, Courage, MD  carvedilol (COREG) 3.125 MG tablet Take 1 tablet (3.125 mg total) by mouth 2 (two) times daily. 10/10/18 01/08/19  Shon HaleEmokpae, Courage, MD  cephALEXin (KEFLEX) 250 MG capsule Take 1 capsule (250 mg total) by mouth 3 (three) times daily. 10/10/18   Shon HaleEmokpae, Courage, MD  [employer] VITAMIN B12 1000 MCG tablet TAKE 2 TABLETS EACH DAY FOR VITAMIN B12 DEFICIENCY. 05/29/16   [provider]  fluticasone (FLONASE) 50 MCG/ACT nasal spray Place 1 spray into both nostrils daily. 10/10/18   Shon HaleEmokpae, Courage, MD  furosemide (LASIX) 40 MG tablet 1 (40 mg) Tab po qam and half- Tab (20 mg) qpm Patient taking differently: 20 mg. 1 (40 mg) Tab po qam and half- Tab (20 mg) qpm 10/10/18   Emokpae, Courage, MD  lisinopril (PRINIVIL,ZESTRIL) 2.5 MG tablet Take 1 tablet (2.5 mg total) by mouth daily. 10/10/18 01/08/19  Shon HaleEmokpae, Courage, MD  potassium chloride SA (KLOR-CON M20) 20 MEQ tablet Take 1 tablet (20 mEq total) by mouth daily. 10/10/18   Shon HaleEmokpae, Courage, MD  Shon HaleQVAR  80 MCG/ACT inhaler Inhale 1 puff into the lungs daily. 10/10/18   Roxan Hockey, MD    Family History Family History  Problem Relation Age of Onset  . Hypertension Mother   . CVA Father   . Heart attack Brother     Social History Social History   Tobacco Use  . Smoking status: Former Smoker    Types: Cigarettes  . Smokeless tobacco: Current User    Types: Chew  . Tobacco comment: has chewed tobacco for 40+ years  Substance Use Topics  . Alcohol use: No  . Drug  use: No     Allergies   Patient has no known allergies.   Review of Systems Review of Systems  Constitutional: Negative for chills and fever.  HENT: Negative for ear pain and sore throat.   Eyes: Negative for pain and visual disturbance.  Respiratory: Positive for shortness of breath and wheezing. Negative for cough.   Cardiovascular: Negative for chest pain and palpitations.  Gastrointestinal: Negative for abdominal pain and vomiting.  Genitourinary: Negative for dysuria and hematuria.  Musculoskeletal: Negative for arthralgias and back pain.  Skin: Negative for color change and rash.  Neurological: Negative for seizures and syncope.  All other systems reviewed and are negative.    Physical Exam Updated Vital Signs There were no vitals taken for this visit.  Physical Exam Vitals signs and nursing note reviewed.  Constitutional:      Comments: Appears to be in mild to moderate respiratory distress but speaking in near full sentences  HENT:     Head: Normocephalic and atraumatic.  Eyes:     Conjunctiva/sclera: Conjunctivae normal.  Neck:     Musculoskeletal: Neck supple.  Cardiovascular:     Rate and Rhythm: Normal rate and regular rhythm.     Heart sounds: No murmur.  Pulmonary:     Comments: Somewhat increased work of breathing but speaking in near full sentences, right-sided breath sounds somewhat diminished, bilateral expiratory wheezing appreciated Abdominal:     Palpations: Abdomen is soft.     Tenderness: There is no abdominal tenderness.  Musculoskeletal:     Right lower leg: No edema.  Skin:    General: Skin is warm and dry.  Neurological:     General: No focal deficit present.     Mental Status: He is alert.  Psychiatric:        Mood and Affect: Mood normal.        Behavior: Behavior normal.      ED Treatments / Results  Labs (all labs ordered are listed, but only abnormal results are displayed) Labs Reviewed  SARS CORONAVIRUS 2 (HOSPITAL  ORDER, [place] [facility])  CBC WITH DIFFERENTIAL/PLATELET  BASIC METABOLIC PANEL  BRAIN NATRIURETIC PEPTIDE  TROPONIN I (HIGH SENSITIVITY)    EKG None  Radiology No results found.  Procedures CHEST TUBE INSERTION  Date/Time: 03/20/2019 4:50 PM Performed by: Lucrezia Starch, MD Authorized by: Lucrezia Starch, MD   Consent:    Consent obtained:  Verbal and written   Consent given by:  Patient   Risks discussed:  Bleeding, incomplete drainage, damage to surrounding structures, infection, pain and nerve damage   Alternatives discussed:  No treatment, delayed treatment, referral and observation Universal protocol:    Site/side marked: yes     Immediately prior to procedure a time out was called: yes     Patient identity confirmed:  Verbally with patient and provided demographic data Pre-procedure details:  Skin preparation:  ChloraPrep Anesthesia (see MAR for exact dosages):    Anesthesia method:  Local infiltration   Local anesthetic:  Lidocaine 1% w/o epi Procedure details:    Placement location:  R lateral   Scalpel size:  11   Tube size (French): 14.   Ultrasound guidance: no     Tube connected to:  Suction   Drainage characteristics:  Air only   Suture material:  2-0 silk   Dressing:  4x4 sterile gauze and petrolatum-impregnated gauze Post-procedure details:    Post-insertion x-ray findings: tube in good position     Patient tolerance of procedure:  Tolerated well, no immediate complications Comments:     Site marked, timeout called prior to initiating procedure.  Site chosen right mid axillary, just below nipple line, prepped with ChloraPrep, sterile field applied, injected 5 mL 1% lidocaine without epinephrine, inserted needle, felt rush of air, inserted guidewire, made skin nick, inserted dilator, inserted pigtail catheter, secured pigtail catheter with suture, applied petroleum gauze followed by sterile gauze followed by foam tape.  Post  procedure chest x-ray demonstrated good placement, no complications, resolution of pneumothorax.  Patient tolerated procedure very well. .Critical Care Performed by: Milagros Loll, MD Authorized by: Milagros Loll, MD   Critical care provider statement:    Critical care time (minutes):  45   Critical care was time spent personally by me on the following activities:  Discussions with consultants, evaluation of patient's response to treatment, examination of patient, ordering and performing treatments and interventions, ordering and review of laboratory studies, ordering and review of radiographic studies, pulse oximetry, re-evaluation of patient's condition, obtaining history from patient or surrogate and review of old charts   (including critical care time)  Medications Ordered in ED Medications  albuterol (VENTOLIN HFA) 108 (90 Base) MCG/ACT inhaler 4 puff (4 puffs Inhalation Given 03/20/19 1427)  lidocaine (PF) (XYLOCAINE) 1 % injection 0-30 mL (30 mLs Intradermal Given by Other 03/20/19 1511)  fentaNYL (SUBLIMAZE) injection 50 mcg (50 mcg Intravenous Given 03/20/19 1515)     Initial Impression / Assessment and Plan / ED Course  I have reviewed the triage vital signs and the nursing notes.  Pertinent labs & imaging results that were available during my care of the patient were reviewed by me and considered in my medical decision making (see chart for details).  Clinical Course as of Mar 19 1646  Thu Mar 20, 2019  1448 Reviewed CXR, will set up for chest tube  DG Chest Portable 1 View [RD]  1540 Finished procedure; no immediate complications, good improvement in sats, dyspnea   [RD]  1613 Reviewed repeat CXR, rechecked patient   [RD]  1631 Discussed with lightfoot CT surgery, recommends transfer to [facility] and hospitalist admission, CT scan to eval for loculations   [RD]    Clinical Course User Index [RD] Milagros Loll, MD       68 year old male presents emergency  department after chief complaint shortness of breath.  Noted significant hypoxia, required 6 L nasal cannula to maintain saturations around 90%.  Chest x-ray concerning for a spontaneous pneumothorax.  Placed pigtail catheter, pneumothorax resolved, lung expanded, significant provement in oxygenation, decreased oxygen requirement.  Consulted CT surgery at [facility], recommended transfer to [facility] and hospitalist admission.  They will see his consult.  Requested CT scan to evaluate for loculations.  Dr. Hali Marry accepting hospitalist.  Final Clinical Impressions(s) / ED Diagnoses   Final diagnoses:  Spontaneous pneumothorax  Acute  respiratory failure with hypoxia [facility])    ED Discharge Orders    None       Milagros Loll, MD 03/20/19 [phone]

## 2019-03-20 NOTE — H&P (Signed)
History and Physical    Derrick [name] NMM:[id number] DOB: 1951/03/02 DOA: 03/20/2019  PCP: Gareth Morgan, MD   Patient coming from: Home  I have personally briefly reviewed patient's old medical records in [facility] Link  Chief Complaint: Shortness of breath  HPI: Derrick [name] is a 68 y.o. male with medical history significant for COPD, pulmonary hypertension, diastolic CHF and rheumatoid arthritis, presented to the ED with sudden onset difficulty breathing.  Patient said he mowed his lawn today, and went to the grocery store and was on his way back he suddenly had difficulty breathing.  He has chronic unchanged mild cough mostly in the morning is unchanged, no fever no chills.  No Chest pain.  Quit smoking cigarettes 10 to 15 years ago.  He uses ~3L O2 as needed mostly at night.  Patient reports weight loss, ~ 15 to 20lbs, 2 to 3 years ago but none since.  On EMS arrival patient's O2 sats was 74% on room air, he was given Solu-Medrol albuterol.  ED Course: O2 sats 84% on 6L nasal cannula, unremarkable CBC, BMP.  Unremarkable BNP 75.  High-sensitivity troponin - 11.  COVID-19 test negative.  Chest x-ray shows large pneumothorax on the right without appreciable tension component.  Patient subsequently had pigtail chest tube placed in ED, with improvement in patient's respiration.  Repeat chest x-ray showed decreased right pneumothorax, question small residual atypical component.  Minimal right lung base opacity atelectasis versus reexpansion pulmonary edema.  EDP talked to cardiothoracic surgery.  Recommended admission to [facility] under hospitalist service, do see in consult.  Also recommended getting a CT scan to ensure no loculations.  CT ordered and pending.   Review of Systems: As per HPI all other systems reviewed and negative.  Past Medical History:  Diagnosis Date  . Allergic rhinitis due to pollen 09/08/2009  . CHF (congestive heart failure) (HCC)   . COPD (chronic obstructive  pulmonary disease) (HCC) 03/14/2013  . 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 Family history of diabetes mellitus 09/08/2009   father  . Family history of stroke (cerebrovascular) 09/08/2009   father  . Nicotine dependence, chewing tobacco, uncomplicated 08/07/1968  . Other disturbances of skin sensation 11/08/2012  . Perennial allergic rhinitis with seasonal variation 11/08/2012  . RA (rheumatoid arthritis) (HCC) 09/08/2009    Past Surgical History:  Procedure Laterality Date  . DENTAL SURGERY      reports that he has quit smoking. His smoking use included cigarettes. His smokeless tobacco use includes chew. He reports that he does not drink alcohol or use drugs.  No Known Allergies  Family History  Problem Relation Age of Onset  . Hypertension Mother   . CVA Father   . Heart attack Brother     Prior to Admission medications   Medication Sig Start Date End Date Taking? Authorizing Provider  albuterol (PROVENTIL HFA;VENTOLIN HFA) 108 (90 Base) MCG/ACT inhaler Inhale 1-2 puffs into the lungs every 6 (six) hours as needed for wheezing or shortness of breath. 10/10/18  Yes Emokpae, Courage, MD  albuterol (PROVENTIL) (2.5 MG/3ML) 0.083% nebulizer solution Take 3 mLs (2.5 mg total) by nebulization every 4 (four) hours as needed for wheezing or shortness of breath. 10/10/18  Yes Emokpae, Courage, MD  carvedilol (COREG) 3.125 MG tablet Take 1 tablet (3.125 mg total) by mouth 2 (two) times daily. 10/10/18 03/20/19 Yes Emokpae, Courage, MD  [employer]S VITAMIN B12 1000 MCG tablet Take 1,000 mcg by mouth daily.  05/29/16  Yes [provider]  fluticasone (FLONASE) 50 MCG/ACT nasal spray Place 1 spray into both nostrils daily. Patient taking differently: Place 2 sprays into both nostrils daily.  10/10/18  Yes Emokpae, Courage, MD  furosemide (LASIX) 40 MG tablet 1 (40 mg) Tab po qam and half- Tab (20 mg) qpm Patient taking differently: Take 40 mg by mouth daily.  10/10/18   Yes Emokpae, Courage, MD  lisinopril (PRINIVIL,ZESTRIL) 2.5 MG tablet Take 1 tablet (2.5 mg total) by mouth daily. 10/10/18 03/20/19 Yes Emokpae, Courage, MD  OXYGEN Inhale 3 L into the lungs at bedtime.   Yes [provider]  potassium chloride SA (KLOR-CON M20) 20 MEQ tablet Take 1 tablet (20 mEq total) by mouth daily. 10/10/18  Yes Roxan Hockey, MD    Physical Exam: Vitals:   03/20/19 1414 03/20/19 1421 03/20/19 1544  BP:  (!) 119/91 (!) 141/83  Pulse: 78  66  Resp: (!) 26  16  Temp: 97.7 F (36.5 C)    TempSrc: Oral    SpO2: (!) 84%  97%    Constitutional: Chronically ill-appearing, calm, comfortable Vitals:   03/20/19 1414 03/20/19 1421 03/20/19 1544  BP:  (!) 119/91 (!) 141/83  Pulse: 78  66  Resp: (!) 26  16  Temp: 97.7 F (36.5 C)    TempSrc: Oral    SpO2: (!) 84%  97%   Eyes: PERRL, lids and conjunctivae normal ENMT: Mucous membranes are moist. Posterior pharynx clear of any exudate or lesions. Neck: normal, supple, no masses, no thyromegaly Respiratory: Pigtail chest tube to right chest, clear to auscultation bilaterally, no wheezing, no crackles. Normal respiratory effort. No accessory muscle use.  Cardiovascular: Regular rate and rhythm, no murmurs / rubs / gallops. No extremity edema. 2+ pedal pulses. .  Abdomen: no tenderness, no masses palpated. No hepatosplenomegaly. Bowel sounds positive.  Musculoskeletal: no clubbing / cyanosis. No joint deformity upper and lower extremities. Good ROM, no contractures. Normal muscle tone.  Skin: no rashes, lesions, ulcers. No induration Neurologic: CN 2-12 grossly intact.  Strength 5/5 in all 4.  Psychiatric: Normal judgment and insight. Alert and oriented x 3. Normal mood.   Labs on Admission: I have personally reviewed following labs and imaging studies  CBC: Recent Labs  Lab 03/20/19 1458  WBC 4.6  NEUTROABS 3.0  HGB 16.1  HCT 52.8*  MCV 102.7*  PLT 409*   Basic Metabolic Panel: Recent Labs  Lab  03/20/19 1458  NA 139  K 4.6  CL 98  CO2 31  GLUCOSE 99  BUN 22  CREATININE 1.01  CALCIUM 9.3   Radiological Exams on Admission: Dg Chest Portable 1 View  Result Date: 03/20/2019 CLINICAL DATA:  Shortness of breath EXAM: PORTABLE CHEST 1 VIEW COMPARISON:  October 04, 2018 FINDINGS: There is a large pneumothorax on the right without appreciable tension component. Lungs otherwise are clear. Heart is slightly enlarged with mild pulmonary venous hypertension. No adenopathy. There is aortic atherosclerosis. No bone lesions. IMPRESSION: Large pneumothorax on the right without appreciable tension component. No edema or consolidation. Mild cardiac enlargement. Aortic Atherosclerosis (ICD10-I70.0). Critical Value/emergent results were called by telephone at the time of interpretation on 03/20/2019 at 2:44 pm to Dr. Madalyn Rob , who verbally acknowledged these results. Electronically Signed   By: Lowella Grip III M.D.   On: 03/20/2019 14:44   Dg Chest Port 1v Same Day  Result Date: 03/20/2019 CLINICAL DATA:  Chest tube placement for pneumothorax. EXAM: PORTABLE CHEST 1 VIEW  COMPARISON:  Radiograph earlier this day at 2:28 p.m. FINDINGS: Right pigtail catheter placement. Decreased right pneumothorax, possible small residual apical component. Minimal right lung base patchy opacity may be atelectasis are re-expansion pulmonary edema. Suspect underlying emphysema. Unchanged heart size and mediastinal contours. No other change from prior exam. IMPRESSION: Right pigtail catheter placement with decreased right pneumothorax, question small residual atypical component. Minimal right lung base opacity may be atelectasis or re-expansion pulmonary edema. Electronically Signed   By: Narda Rutherford M.D.   On: 03/20/2019 16:29    EKG: Independently reviewed.  Sinus rhythm, rate 82, QTc 452.  LVH.  No significant ST abnormalities.  Pronounced T wave lateral leads, ? 2/2 [place].  Assessment/Plan Active  Problems:   Spontaneous pneumothorax     Right spontaneous pneumothorax- likely related to COPD.  Chest x-ray showed large right pneumothorax.  Hypoxic O2 sats 74% on room air per EMS. S/p right chest pigtail catheter.  Repeat chest x-ray decreased pneumothorax. EDP talked to cardiothoracic surgery, admit to [facility] under hospitalist service dosing consult. - Page cardiothoracic surgery on arrival to floor - NPO pending CTS eval. - Follow-up CT chest without contrast recommended by CT surgery  COPD, pulmonary hypertension- on O2 as needed 3 L mostly at night.  No wheezing or rhonchi. -PRN albuterol  Diastolic CHF-stable and compensated.  BNP unremarkable at 75.  Last echo 09/2018, EF 60-65, severe left ventricular hypertrophy, impaired diastolic relaxation. -Hold home Lasix 40/20. -Hold home Coreg and lisinopril for now in the setting of spontaneous pneumothorax.  Rheumatoid arthritis-stable.  Not on medications.  DVT prophylaxis: SCDS Code Status: Full Family Communication: None at bedside Disposition Plan: Per rounding team Consults called: Cardiothoracic surgery Admission status: Inpatient, telemetry I certify that at the point of admission it is my clinical judgment that the patient will require inpatient hospital care spanning beyond 2 midnights from the point of admission due to high intensity of service, high risk for further deterioration and high frequency of surveillance required. The following factors support the patient status of inpatient:    Onnie Boer MD [facility]  03/20/2019, 5:26 PM

## 2019-03-21 ENCOUNTER — Inpatient Hospital Stay (HOSPITAL_COMMUNITY): Payer: Medicare HMO

## 2019-03-21 ENCOUNTER — Other Ambulatory Visit: Payer: Self-pay

## 2019-03-21 DIAGNOSIS — J939 Pneumothorax, unspecified: Secondary | ICD-10-CM

## 2019-03-21 LAB — BASIC METABOLIC PANEL
Anion gap: 9 (ref 5–15)
BUN: 20 mg/dL (ref 8–23)
CO2: 30 mmol/L (ref 22–32)
Calcium: 9.2 mg/dL (ref 8.9–10.3)
Chloride: 99 mmol/L (ref 98–111)
Creatinine, Ser: 0.87 mg/dL (ref 0.61–1.24)
GFR calc Af Amer: >60 mL/min (ref >60)
GFR calc non Af Amer: >60 mL/min (ref >60)
Glucose, Bld: 113 mg/dL — ABNORMAL HIGH (ref 70–99)
Potassium: 5.6 mmol/L — ABNORMAL HIGH (ref 3.5–5.1)
Sodium: 138 mmol/L (ref 135–145)

## 2019-03-21 LAB — CBC
HCT: 50.5 % (ref 39.0–52.0)
Hemoglobin: 15.9 g/dL (ref 13.0–17.0)
MCH: 31.4 pg (ref 26.0–34.0)
MCHC: 31.5 g/dL (ref 30.0–36.0)
MCV: 99.6 fL (ref 80.0–100.0)
Platelets: 136 10*3/uL — ABNORMAL LOW (ref 150–400)
RBC: 5.07 MIL/uL (ref 4.22–5.81)
RDW: 12.1 % (ref 11.5–15.5)
WBC: 6.5 10*3/uL (ref 4.0–10.5)
nRBC: 0 % (ref 0.0–0.2)

## 2019-03-21 MED ORDER — CARVEDILOL 3.125 MG PO TABS
3.1250 mg | ORAL_TABLET | Freq: Two times a day (BID) | ORAL | Status: DC
  Administered 2019-03-21 – 2019-03-23 (×5): 3.125 mg via ORAL
  Filled 2019-03-21 (×5): qty 1

## 2019-03-21 MED ORDER — ALBUTEROL SULFATE HFA 108 (90 BASE) MCG/ACT IN AERS: 1.0000 | INHALATION_SPRAY | Freq: Four times a day (QID) | RESPIRATORY_TRACT | Status: DC | PRN

## 2019-03-21 MED ORDER — ENSURE ENLIVE PO LIQD
237.0000 mL | Freq: Three times a day (TID) | ORAL | Status: DC
  Administered 2019-03-21 – 2019-03-30 (×24): 237 mL via ORAL
  Filled 2019-03-21: qty 237

## 2019-03-21 MED ORDER — FUROSEMIDE 40 MG PO TABS
40.0000 mg | ORAL_TABLET | Freq: Every day | ORAL | Status: DC
  Administered 2019-03-21 – 2019-03-31 (×10): 40 mg via ORAL
  Filled 2019-03-21 (×11): qty 1

## 2019-03-21 MED ORDER — ADULT MULTIVITAMIN W/MINERALS CH
1.0000 | ORAL_TABLET | Freq: Every day | ORAL | Status: DC
  Administered 2019-03-21 – 2019-03-31 (×11): 1 via ORAL
  Filled 2019-03-21 (×11): qty 1

## 2019-03-21 NOTE — Progress Notes (Signed)
Initial Nutrition Assessment  DOCUMENTATION CODES:   Severe malnutrition in context of chronic illness, Underweight  INTERVENTION:   -Ensure Enlive po TID, each supplement provides 350 kcal and 20 grams of protein -MVI with minerals daily -Magic cup TID with meals, each supplement provides 290 kcal and 9 grams of protein -Downgrade diet to dysphagia 3 (advanced mechanical soft) for ease of intake, as pt is missing several teeth  NUTRITION DIAGNOSIS:   Severe Malnutrition related to chronic illness(COPD) as evidenced by severe muscle depletion, severe fat depletion, percent weight loss.  GOAL:   Patient will meet greater than or equal to 90% of their needs  MONITOR:   PO intake, Supplement acceptance, Labs, Weight trends, Skin, I & O's  REASON FOR ASSESSMENT:   Other (Comment)    ASSESSMENT:   Derrick [name] is a 68 y.o. male with medical history significant for COPD, pulmonary hypertension, diastolic CHF and rheumatoid arthritis, presented to the ED with sudden onset difficulty breathing.  Patient said he mowed his lawn today, and went to the grocery store and was on his way back he suddenly had difficulty breathing.  He has chronic unchanged mild cough mostly in the morning is unchanged, no fever no chills.  No Chest pain.  Quit smoking cigarettes 10 to 15 years ago.  He uses ~3L O2 as needed mostly at night.  Patient reports weight loss, ~ 15 to 20lbs, 2 to 3 years ago but none since.  Pt admitted with rt spontaneous pneumothorax.   8/13- rt chest tube placed  Reviewed I/O's: -150 ml x 24 hours  Pt awaiting CVTS consult.  Spoke with pt at bedside, who reports good appetite. He shares he usually consumes 3 meals per day (Breakfast: grits and eggs or oatmeal; Lunch: sandwich; Dinner: meat, starch, and vegetable). He reports he is very hungry as NPO diet order was just lifted. Noted pt consumed 100% of vanilla Ensure supplement provided from RN. Noted pt with several missing  teeth; he denies difficulty chewing foods "as long as it's not too hard; I can eat most anything, other than the bones".   Pt reports that his UBW is over 200# and noticed a progressive wt loss over the past several years. He suspect he has lost "a couple of pounds" over the past month. Per wt hx, pt has experienced a 16.7% wt loss over the past 3 months, which is significant for time frame. Pt denies any changes in activity level- he is still able to drive, climb stairs, and mow his yard without difficulty.   Discussed with pt importance of good meal and supplement intake to promote healing. He is amenable to continue Ensure supplements.  Labs reviewed: K: 5.6.   NUTRITION - FOCUSED PHYSICAL EXAM:    Most Recent Value  Orbital Region  Severe depletion  Upper Arm Region  Severe depletion  Thoracic and Lumbar Region  Severe depletion  Buccal Region  Severe depletion  Temple Region  Severe depletion  Clavicle Bone Region  Severe depletion  Clavicle and Acromion Bone Region  Severe depletion  Scapular Bone Region  Severe depletion  Dorsal Hand  Severe depletion  Patellar Region  Severe depletion  Anterior Thigh Region  Severe depletion  Posterior Calf Region  Severe depletion  Edema (RD Assessment)  None  Hair  Reviewed  Eyes  Reviewed  Mouth  Reviewed  Skin  Reviewed  Nails  Reviewed       Diet Order:   Diet Order  Diet Heart Room service appropriate? No; Fluid consistency: Thin  Diet effective now              EDUCATION NEEDS:   Education needs have been addressed  Skin:  Skin Assessment: Reviewed RN Assessment  Last BM:  03/20/19  Height:   Ht Readings from Last 1 Encounters:  03/20/19 5\' 11"  (1.803 m)    Weight:   Wt Readings from Last 1 Encounters:  03/20/19 51.7 kg    Ideal Body Weight:  78.2 kg  BMI:  Body mass index is 15.9 kg/m.  Estimated Nutritional Needs:   Kcal:  2050-2250  Protein:  105-120 grams  Fluid:  > 2  L    Swain Acree A. 03-09-1977, RD, LDN, CDCES Registered Dietitian II Certified Diabetes Care and Education Specialist Pager: [phone] After hours Pager: [phone]

## 2019-03-21 NOTE — Consult Note (Signed)
[address]       [name] [id number]             [phone]                    [name] Medical Record #[id number] Date of Birth: [birth year]  Referring: No ref. provider found Primary Care: Gareth MorganKnowlton, Steve, MD Primary Cardiologist: Dina RichBranch, Jonathan, MD  Chief Complaint:    Chief Complaint  Patient presents with   Shortness of Breath    History of Present Illness:    Derrick [name] Morten 68 y.o. male with a history of severe COPD on home oxygen is transferred [facility] with a diagnosis of a spontaneous pneumothorax.  A pigtail catheter was placed with resolution of the pneumothorax.  [facility].  He currently denies any chest pain or shortness of breath.   Zubrod Score: At the time of surgery this patients most appropriate activity status/level should be described as: []     0    Normal activity, no symptoms []     1    Restricted in physical strenuous activity but ambulatory, able to do out light work []     2    Ambulatory and capable of self care, unable to do work activities, up and about               >50 % of waking hours                              [x]     3    Only limited self care, in bed greater than 50% of waking hours []     4    Completely disabled, no self care, confined to bed or chair []     5    Moribund   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) 11/07/2011   Family history of diabetes mellitus 09/08/2009   father   Family history of stroke (cerebrovascular) 09/08/2009   father   Nicotine dependence, chewing tobacco, uncomplicated 08/07/1968   Other disturbances of skin sensation 11/08/2012   Perennial allergic rhinitis with seasonal  variation 11/08/2012   RA (rheumatoid arthritis) (HCC) 09/08/2009    Past Surgical History:  Procedure Laterality Date   DENTAL SURGERY      Family History  Problem Relation Age of Onset   Hypertension Mother    CVA Father    Heart attack Brother      Social History   Tobacco Use  Smoking Status Former Smoker   Types: Cigarettes  Smokeless Tobacco Current User   Types: Chew  Tobacco Comment   has chewed tobacco for 40+ years    Social History   Substance and Sexual Activity  Alcohol Use No     No Known Allergies  Current Facility-Administered Medications  Medication Dose Route Frequency Provider Last Rate Last Dose   acetaminophen (TYLENOL) tablet 650 mg  650 mg Oral Q6H PRN Emokpae, Ejiroghene E, MD       Or   acetaminophen (TYLENOL) suppository 650 mg  650 mg Rectal Q6H PRN Emokpae, Ejiroghene E, MD       albuterol (  PROVENTIL) (2.5 MG/3ML) 0.083% nebulizer solution 2.5 mg  2.5 mg Nebulization Q6H PRN Emokpae, Ejiroghene E, MD       fluticasone (FLONASE) 50 MCG/ACT nasal spray 1 spray  1 spray Each Nare Daily Emokpae, Ejiroghene E, MD   1 spray at 03/21/19 1031   ondansetron (ZOFRAN) tablet 4 mg  4 mg Oral Q6H PRN Emokpae, Ejiroghene E, MD       Or   ondansetron (ZOFRAN) injection 4 mg  4 mg Intravenous Q6H PRN Emokpae, Ejiroghene E, MD       polyethylene glycol (MIRALAX / GLYCOLAX) packet 17 g  17 g Oral Daily PRN Emokpae, Ejiroghene E, MD         Review of Systems  Constitutional: Positive for malaise/fatigue. Negative for chills, fever and weight loss.  HENT: Negative.   Eyes: Negative.   Respiratory: Positive for cough and shortness of breath. Negative for hemoptysis and sputum production.   Cardiovascular: Negative for chest pain, palpitations, orthopnea and PND.  Gastrointestinal: Negative.   Musculoskeletal: Negative.   Skin: Negative.   Neurological: Negative.     PHYSICAL EXAMINATION: BP 108/60 (BP Location: Left Arm)    Pulse (!)  59    Temp 97.7 F (36.5 C) (Oral)    Resp 18    Ht 5\' 11"  (1.803 m)    Wt 51.7 kg    SpO2 91%    BMI 15.90 kg/m  General: non-toxic, appears older than stated age.     HEENT: NCAT.  Moist mucous membranes, Oropharynx clear.  Poor dentition.  Neuro: Alert, Oriented X 3. Non focal. Psych: appropriate mood. Cardiovascular: Regular rate and rhythm.  No murmur Pulmonary: Distant breath sounds.  Pigtail catheter in place. no evidence of leak in the Pleur-evac. GI: Soft nontender nondistended    Diagnostic Studies & Laboratory data:     Recent Radiology Findings:   Ct Chest Wo Contrast  Result Date: 03/20/2019 CLINICAL DATA:  Follow-up for resolution of pneumothorax. EXAM: CT CHEST WITHOUT CONTRAST TECHNIQUE: Multidetector CT imaging of the chest was performed following the standard protocol without IV contrast. COMPARISON:  Radiographs earlier this day. FINDINGS: Cardiovascular: Moderate aortic atherosclerosis. Aortic tortuosity with ectasia of the descending thoracic aorta, 3.8 cm. Dilated main pulmonary artery at 4 cm. Coronary artery calcifications. Heart is normal in size. No pericardial effusion. Mediastinum/Nodes: No enlarged mediastinal lymph nodes. Limited assessment for hilar adenopathy given lack of IV contrast. The esophagus is decompressed. No visualized thyroid nodule. Lungs/Pleura: Right pigtail catheter in place, tip along the major fissure. Advanced emphysema with large apical bulla, air-filled structure in the right lung apex felt to represent a large bulla rather than residual pneumothorax. Small partially loculated residual pneumothorax noted anterior inferiorly anterior to the right middle lobe. Right lower lobe opacity favoring atelectasis. Large bulla also noted in the left upper lobe. Subsegmental atelectasis in the left upper lobe. No pleural fluid or pulmonary mass. Upper Abdomen: No acute findings. Atherosclerosis of upper abdominal vasculature. Musculoskeletal: There are no  acute or suspicious osseous abnormalities. IMPRESSION: 1. Right pigtail catheter in place, tip along the major fissure. Small partially loculated pneumothorax anterior inferiorly to the right middle lobe. 2. Advanced emphysema with large apical bulla. Air-filled structure in the right lung apex felt to represent a large bulla rather than residual pneumothorax. Right lower lobe opacity favoring atelectasis. 3. Aortic atherosclerosis, ectatic descending thoracic aorta at 3.7 cm. Coronary artery calcifications. 4. Dilated main pulmonary artery suggesting pulmonary arterial hypertension. Aortic Atherosclerosis (ICD10-I70.0) and Emphysema (  ICD10-J43.9). Electronically Signed   By: Keith Rake M.D.   On: 03/20/2019 18:37   Dg Chest Port 1 View  Result Date: 03/21/2019 CLINICAL DATA:  Shortness of breath, follow-up chest tube EXAM: PORTABLE CHEST 1 VIEW COMPARISON:  03/20/2019 FINDINGS: Cardiac shadow is stable. Right pigtail catheter is again noted. No definitive pneumothorax is seen. No focal infiltrate or sizable effusion is noted. Aortic calcifications are seen. IMPRESSION: No definitive pneumothorax.  Stable right chest tube is noted. Electronically Signed   By: Inez Catalina M.D.   On: 03/21/2019 08:34   Dg Chest Port 1 View  Result Date: 03/20/2019 CLINICAL DATA:  Increasing hypoxia. EXAM: PORTABLE CHEST 1 VIEW COMPARISON:  Plain film CT of earlier today. FINDINGS: 0907 hours. Right-sided pigtail catheter again projects over the right mid lung. No residual pneumothorax identified. Midline trachea. Normal heart size. Prominent pulmonary arteries. Tortuous thoracic aorta. No pleural fluid. Clear lungs. IMPRESSION: Right-sided pigtail catheter in place, without plain film evidence of residual pneumothorax. Clearing of right base airspace disease since radiographs of earlier today. Pulmonary artery enlargement suggests pulmonary arterial hypertension. Electronically Signed   By: Abigail Miyamoto M.D.   On:  03/20/2019 21:22   Dg Chest Portable 1 View  Result Date: 03/20/2019 CLINICAL DATA:  Shortness of breath EXAM: PORTABLE CHEST 1 VIEW COMPARISON:  October 04, 2018 FINDINGS: There is a large pneumothorax on the right without appreciable tension component. Lungs otherwise are clear. Heart is slightly enlarged with mild pulmonary venous hypertension. No adenopathy. There is aortic atherosclerosis. No bone lesions. IMPRESSION: Large pneumothorax on the right without appreciable tension component. No edema or consolidation. Mild cardiac enlargement. Aortic Atherosclerosis (ICD10-I70.0). Critical Value/emergent results were called by telephone at the time of interpretation on 03/20/2019 at 2:44 pm to Dr. Madalyn Rob , who verbally acknowledged these results. Electronically Signed   By: Lowella Grip III M.D.   On: 03/20/2019 14:44   Dg Chest Port 1v Same Day  Result Date: 03/20/2019 CLINICAL DATA:  Chest tube placement for pneumothorax. EXAM: PORTABLE CHEST 1 VIEW COMPARISON:  Radiograph earlier this day at 2:28 p.m. FINDINGS: Right pigtail catheter placement. Decreased right pneumothorax, possible small residual apical component. Minimal right lung base patchy opacity may be atelectasis are re-expansion pulmonary edema. Suspect underlying emphysema. Unchanged heart size and mediastinal contours. No other change from prior exam. IMPRESSION: Right pigtail catheter placement with decreased right pneumothorax, question small residual atypical component. Minimal right lung base opacity may be atelectasis or re-expansion pulmonary edema. Electronically Signed   By: Keith Rake M.D.   On: 03/20/2019 16:29       I have independently reviewed the above radiology studies  and reviewed the findings with the patient.   Recent Lab Findings: Lab Results  Component Value Date   WBC 6.5 03/21/2019   HGB 15.9 03/21/2019   HCT 50.5 03/21/2019   PLT 136 (L) 03/21/2019   GLUCOSE 113 (H) 03/21/2019   ALT  31 10/04/2018   AST 34 10/04/2018   NA 138 03/21/2019   K 5.6 (H) 03/21/2019   CL 99 03/21/2019   CREATININE 0.87 03/21/2019   BUN 20 03/21/2019   CO2 30 03/21/2019      Assessment / Plan:   68 year old male with a history of severe bullous emphysema is currently on oxygen as well.  Review of his imaging has significant bullous disease which would make surgical resection difficult.  Additionally given that he does not have a significant leak of 1 his Pleur-evac,  an  endobronchial valve would also be challenging.  Discussed the option of conservative management with chest tube drainage and he is agreeable with that plan.   I laced his chest tube on waterseal today, and will follow up with with daily chest x-rays.  If he does develop a leak or recurrent pneumothorax then another option would be to perform a bedside chemical pleurodesis.  Given the extent of his COPD, I highly recommend that he be evaluated by pulmonary medicine as well as a potential referral to a lung transplant center so that he can have a discussion about his potential options.  No need for surgery at this point.  We will continue to follow for chest tube management     I  spent 30 minutes with  the patient face to face and greater then 50% of the time was spent in counseling and coordination of care.    Corliss Skains 03/21/2019 10:57 AM

## 2019-03-21 NOTE — Progress Notes (Signed)
PROGRESS NOTE    Derrick [name]  RUE:[id number] DOB: 1951-03-23 DOA: 03/20/2019 PCP: Gareth MorganKnowlton, Steve, MD   Brief Narrative:  Derrick [name] is a 68 y.o. male with medical history significant for COPD, pulmonary hypertension, diastolic CHF and rheumatoid arthritis, presented to the [facility] with sudden onset difficulty breathing.  He has chronic unchanged mild cough mostly in the morning which is unchanged, no fever no chills. Quit smoking cigarettes 10 to 15 years ago.  He uses ~3L O2 as needed mostly at night.  Patient reports weight loss, ~ 15 to 20lbs, 2 to 3 years ago but none since. On EMS arrival patient's O2 sats was 74% on room air, he was given Solu-Medrol albuterol.  Upon arrival to the ER, O2 sats 84% on 6L nasal cannula, unremarkable CBC, BMP.  Unremarkable BNP 75. COVID-19 test negative.  Chest x-ray shows large pneumothorax on the right without appreciable tension component.  Patient subsequently had pigtail chest tube placed in ED, with improvement in patient's respiration.  Repeat chest x-ray showed decreased right pneumothorax, question small residual atypical component.  Minimal right lung base opacity atelectasis versus reexpansion pulmonary edema.  EDP talked to cardiothoracic surgery and per their recommendation, [facility] hospitalist service with CTS consulting.   Assessment & Plan:   Active Problems:   Spontaneous pneumothorax   Acute spontaneous right-sided tension pneumothorax: As a consequence of severe bullous emphysema.  Chest tube in place.  Management per CTS.  COPD: Stable.  No wheezes.  Continue bronchodilators.  Chronic diastolic CHF: Compensated.  Last echo in February 2020 shows ejection fraction of 60%.  Resume carvedilol and Lasix.  DVT prophylaxis: SCD Code Status: Full code Family Communication: None present Disposition Plan: TBD  Consultants:   CTS  Procedures:   Chest tube placement  Antimicrobials:     None   Subjective: Patient seen and examined.  States that he feels much better.  Breathing is improved.  No new complaint.  Objective: Vitals:   03/21/19 0558 03/21/19 0900 03/21/19 0931 03/21/19 1000  BP: 108/60     Pulse: (!) 59     Resp: 16 18 18 18   Temp: 97.7 F (36.5 C)     TempSrc: Oral     SpO2: 95% (!) 88% (!) 88% 91%  Weight:      Height:        Intake/Output Summary (Last 24 hours) at 03/21/2019 1203 Last data filed at 03/21/2019 0934 Gross per 24 hour  Intake 0 ml  Output 150 ml  Net -150 ml   Filed Weights   03/20/19 2250  Weight: 51.7 kg    Examination:  General exam: Appears calm and comfortable  Respiratory system: Clear to auscultation with some diminished breath sounds at bases. Respiratory effort normal. Cardiovascular system: S1 & S2 heard, RRR. No JVD, murmurs, rubs, gallops or clicks. No pedal edema. Gastrointestinal system: Abdomen is nondistended, soft and nontender. No organomegaly or masses felt. Normal bowel sounds heard. Central nervous system: Alert and oriented. No focal neurological deficits. Extremities: Symmetric 5 x 5 power. Skin: No rashes, lesions or ulcers Psychiatry: Judgement and insight appear normal. Mood & affect appropriate.    Data Reviewed: I have personally reviewed following labs and imaging studies  CBC: Recent Labs  Lab 03/20/19 1458 03/21/19 0400  WBC 4.6 6.5  NEUTROABS 3.0  --   HGB 16.1 15.9  HCT 52.8* 50.5  MCV 102.7* 99.6  PLT 133* 136*   Basic  Metabolic Panel: Recent Labs  Lab 03/20/19 1458 03/21/19 0400  NA 139 138  K 4.6 5.6*  CL 98 99  CO2 31 30  GLUCOSE 99 113*  BUN 22 20  CREATININE 1.01 0.87  CALCIUM 9.3 9.2   GFR: Estimated Creatinine Clearance: 61.1 mL/min (by C-G formula based on SCr of 0.87 mg/dL). Liver Function Tests: No results for input(s): AST, ALT, ALKPHOS, BILITOT, PROT, ALBUMIN in the last 168 hours. No results for input(s): LIPASE, AMYLASE in the last 168 hours. No  results for input(s): AMMONIA in the last 168 hours. Coagulation Profile: No results for input(s): INR, PROTIME in the last 168 hours. Cardiac Enzymes: No results for input(s): CKTOTAL, CKMB, CKMBINDEX, TROPONINI in the last 168 hours. BNP (last 3 results) No results for input(s): PROBNP in the last 8760 hours. HbA1C: No results for input(s): HGBA1C in the last 72 hours. CBG: No results for input(s): GLUCAP in the last 168 hours. Lipid Profile: No results for input(s): CHOL, HDL, LDLCALC, TRIG, CHOLHDL, LDLDIRECT in the last 72 hours. Thyroid Function Tests: No results for input(s): TSH, T4TOTAL, FREET4, T3FREE, THYROIDAB in the last 72 hours. Anemia Panel: No results for input(s): VITAMINB12, FOLATE, FERRITIN, TIBC, IRON, RETICCTPCT in the last 72 hours. Sepsis Labs: No results for input(s): PROCALCITON, LATICACIDVEN in the last 168 hours.  Recent Results (from the past 240 hour(s))  SARS Coronavirus 2 [facility] order, Performed in [facility] hospital lab) Nasopharyngeal Nasopharyngeal Swab     Status: None   Collection Time: 03/20/19  2:17 PM   Specimen: Nasopharyngeal Swab  Result Value Ref Range Status   SARS Coronavirus 2 NEGATIVE NEGATIVE Final    Comment: (NOTE) If result is NEGATIVE SARS-CoV-2 target nucleic acids are NOT DETECTED. The SARS-CoV-2 RNA is generally detectable in upper and lower  respiratory specimens during the acute phase of infection. The lowest  concentration of SARS-CoV-2 viral copies this assay can detect is 250  copies / mL. A negative result does not preclude SARS-CoV-2 infection  and should not be used as the sole basis for treatment or other  patient management decisions.  A negative result may occur with  improper specimen collection / handling, submission of specimen other  than nasopharyngeal swab, presence of viral mutation(s) within the  areas targeted by this assay, and inadequate number of viral copies  (<250 copies / mL). A negative  result must be combined with clinical  observations, patient history, and epidemiological information. If result is POSITIVE SARS-CoV-2 target nucleic acids are DETECTED. The SARS-CoV-2 RNA is generally detectable in upper and lower  respiratory specimens dur ing the acute phase of infection.  Positive  results are indicative of active infection with SARS-CoV-2.  Clinical  correlation with patient history and other diagnostic information is  necessary to determine patient infection status.  Positive results do  not rule out bacterial infection or co-infection with other viruses. If result is PRESUMPTIVE POSTIVE SARS-CoV-2 nucleic acids MAY BE PRESENT.   A presumptive positive result was obtained on the submitted specimen  and confirmed on repeat testing.  While 2019 novel coronavirus  (SARS-CoV-2) nucleic acids may be present in the submitted sample  additional confirmatory testing may be necessary for epidemiological  and / or clinical management purposes  to differentiate between  SARS-CoV-2 and other Sarbecovirus currently known to infect humans.  If clinically indicated additional testing with an alternate test  methodology [phone]) is advised. The SARS-CoV-2 RNA is generally  detectable in upper and lower respiratory sp  ecimens during the acute  phase of infection. The expected result is Negative. Fact Sheet for Patients:  StrictlyIdeas.no Fact Sheet for Healthcare Providers: BankingDealers.co.za This test is not yet approved or cleared by the [place] FDA and has been authorized for detection and/or diagnosis of SARS-CoV-2 by FDA under an Emergency Use Authorization (EUA).  This EUA will remain in effect (meaning this test can be used) for the duration of the COVID-19 declaration under Section 564(b)(1) of the Act, 21 U.S.C. section 360bbb-3(b)(1), unless the authorization is terminated or revoked sooner. Performed at [facility], [address], [place], [place] [address]       Radiology Studies: Ct Chest Wo Contrast  Result Date: 03/20/2019 CLINICAL DATA:  Follow-up for resolution of pneumothorax. EXAM: CT CHEST WITHOUT CONTRAST TECHNIQUE: Multidetector CT imaging of the chest was performed following the standard protocol without IV contrast. COMPARISON:  Radiographs earlier this day. FINDINGS: Cardiovascular: Moderate aortic atherosclerosis. Aortic tortuosity with ectasia of the descending thoracic aorta, 3.8 cm. Dilated main pulmonary artery at 4 cm. Coronary artery calcifications. Heart is normal in size. No pericardial effusion. Mediastinum/Nodes: No enlarged mediastinal lymph nodes. Limited assessment for hilar adenopathy given lack of IV contrast. The esophagus is decompressed. No visualized thyroid nodule. Lungs/Pleura: Right pigtail catheter in place, tip along the major fissure. Advanced emphysema with large apical bulla, air-filled structure in the right lung apex felt to represent a large bulla rather than residual pneumothorax. Small partially loculated residual pneumothorax noted anterior inferiorly anterior to the right middle lobe. Right lower lobe opacity favoring atelectasis. Large bulla also noted in the left upper lobe. Subsegmental atelectasis in the left upper lobe. No pleural fluid or pulmonary mass. Upper Abdomen: No acute findings. Atherosclerosis of upper abdominal vasculature. Musculoskeletal: There are no acute or suspicious osseous abnormalities. IMPRESSION: 1. Right pigtail catheter in place, tip along the major fissure. Small partially loculated pneumothorax anterior inferiorly to the right middle lobe. 2. Advanced emphysema with large apical bulla. Air-filled structure in the right lung apex felt to represent a large bulla rather than residual pneumothorax. Right lower lobe opacity favoring atelectasis. 3. Aortic atherosclerosis, ectatic descending thoracic aorta at 3.7 cm. Coronary artery  calcifications. 4. Dilated main pulmonary artery suggesting pulmonary arterial hypertension. Aortic Atherosclerosis (ICD10-I70.0) and Emphysema (ICD10-J43.9). Electronically Signed   By: Keith Rake M.D.   On: 03/20/2019 18:37   Dg Chest Port 1 View  Result Date: 03/21/2019 CLINICAL DATA:  Shortness of breath, follow-up chest tube EXAM: PORTABLE CHEST 1 VIEW COMPARISON:  03/20/2019 FINDINGS: Cardiac shadow is stable. Right pigtail catheter is again noted. No definitive pneumothorax is seen. No focal infiltrate or sizable effusion is noted. Aortic calcifications are seen. IMPRESSION: No definitive pneumothorax.  Stable right chest tube is noted. Electronically Signed   By: Inez Catalina M.D.   On: 03/21/2019 08:34   Dg Chest Port 1 View  Result Date: 03/20/2019 CLINICAL DATA:  Increasing hypoxia. EXAM: PORTABLE CHEST 1 VIEW COMPARISON:  Plain film CT of earlier today. FINDINGS: 0907 hours. Right-sided pigtail catheter again projects over the right mid lung. No residual pneumothorax identified. Midline trachea. Normal heart size. Prominent pulmonary arteries. Tortuous thoracic aorta. No pleural fluid. Clear lungs. IMPRESSION: Right-sided pigtail catheter in place, without plain film evidence of residual pneumothorax. Clearing of right base airspace disease since radiographs of earlier today. Pulmonary artery enlargement suggests pulmonary arterial hypertension. Electronically Signed   By: Abigail Miyamoto M.D.   On: 03/20/2019 21:22   Dg Chest Portable 1  View  Result Date: 03/20/2019 CLINICAL DATA:  Shortness of breath EXAM: PORTABLE CHEST 1 VIEW COMPARISON:  October 04, 2018 FINDINGS: There is a large pneumothorax on the right without appreciable tension component. Lungs otherwise are clear. Heart is slightly enlarged with mild pulmonary venous hypertension. No adenopathy. There is aortic atherosclerosis. No bone lesions. IMPRESSION: Large pneumothorax on the right without appreciable tension component.  No edema or consolidation. Mild cardiac enlargement. Aortic Atherosclerosis (ICD10-I70.0). Critical Value/emergent results were called by telephone at the time of interpretation on 03/20/2019 at 2:44 pm to Dr. Marianna Fuss , who verbally acknowledged these results. Electronically Signed   By: Bretta Bang III M.D.   On: 03/20/2019 14:44   Dg Chest Port 1v Same Day  Result Date: 03/20/2019 CLINICAL DATA:  Chest tube placement for pneumothorax. EXAM: PORTABLE CHEST 1 VIEW COMPARISON:  Radiograph earlier this day at 2:28 p.m. FINDINGS: Right pigtail catheter placement. Decreased right pneumothorax, possible small residual apical component. Minimal right lung base patchy opacity may be atelectasis are re-expansion pulmonary edema. Suspect underlying emphysema. Unchanged heart size and mediastinal contours. No other change from prior exam. IMPRESSION: Right pigtail catheter placement with decreased right pneumothorax, question small residual atypical component. Minimal right lung base opacity may be atelectasis or re-expansion pulmonary edema. Electronically Signed   By: Narda Rutherford M.D.   On: 03/20/2019 16:29    Scheduled Meds:  fluticasone  1 spray Each Nare Daily   Continuous Infusions:   LOS: 1 day   Time spent: 35 minutes   Hughie Closs, MD [facility] Pager [phone]  If 7PM-7AM, please contact night-coverage [email or web address] Password [facility] 03/21/2019, 12:03 PM

## 2019-03-21 NOTE — Progress Notes (Signed)
I called the cardio/thoracic surgeon consult # for Dr. Doristine Bosworth. I left a message on Ashley's answering machine.

## 2019-03-21 NOTE — Progress Notes (Signed)
Pt presented to the floor alert and oriented times four. Pt was educated about the floor and the call bell system. Pt stated that he will use his call bell for any needs and before attempting to get out of bed. Pt reports no pain at this time. Pt had no skin issues to report per the required RN skin check. Upon arriving to the floor the cardiothoracic surgeon was paged per the orders. Cardiothoracic surgeon stated that he was familiar with the pt and was aware that he was on the floor. He also state that the suction should be set on -20.

## 2019-03-22 DIAGNOSIS — Z9689 Presence of other specified functional implants: Secondary | ICD-10-CM

## 2019-03-22 DIAGNOSIS — E43 Unspecified severe protein-calorie malnutrition: Secondary | ICD-10-CM

## 2019-03-22 DIAGNOSIS — J9601 Acute respiratory failure with hypoxia: Secondary | ICD-10-CM

## 2019-03-22 DIAGNOSIS — Z938 Other artificial opening status: Secondary | ICD-10-CM

## 2019-03-22 LAB — CBC WITH DIFFERENTIAL/PLATELET
Abs Immature Granulocytes: 0.02 10*3/uL (ref 0.00–0.07)
Basophils Absolute: 0 10*3/uL (ref 0.0–0.1)
Basophils Relative: 0 %
Eosinophils Absolute: 0.1 10*3/uL (ref 0.0–0.5)
Eosinophils Relative: 1 %
HCT: 48.5 % (ref 39.0–52.0)
Hemoglobin: 15.5 g/dL (ref 13.0–17.0)
Immature Granulocytes: 0 %
Lymphocytes Relative: 11 %
Lymphs Abs: 0.9 10*3/uL (ref 0.7–4.0)
MCH: 31.5 pg (ref 26.0–34.0)
MCHC: 32 g/dL (ref 30.0–36.0)
MCV: 98.6 fL (ref 80.0–100.0)
Monocytes Absolute: 0.7 10*3/uL (ref 0.1–1.0)
Monocytes Relative: 9 %
Neutro Abs: 6.3 10*3/uL (ref 1.7–7.7)
Neutrophils Relative %: 79 %
Platelets: 130 10*3/uL — ABNORMAL LOW (ref 150–400)
RBC: 4.92 MIL/uL (ref 4.22–5.81)
RDW: 12.2 % (ref 11.5–15.5)
WBC: 8 10*3/uL (ref 4.0–10.5)
nRBC: 0 % (ref 0.0–0.2)

## 2019-03-22 LAB — BASIC METABOLIC PANEL
Anion gap: 8 (ref 5–15)
BUN: 28 mg/dL — ABNORMAL HIGH (ref 8–23)
CO2: 31 mmol/L (ref 22–32)
Calcium: 9.1 mg/dL (ref 8.9–10.3)
Chloride: 97 mmol/L — ABNORMAL LOW (ref 98–111)
Creatinine, Ser: 0.74 mg/dL (ref 0.61–1.24)
GFR calc Af Amer: >60 mL/min (ref >60)
GFR calc non Af Amer: >60 mL/min (ref >60)
Glucose, Bld: 91 mg/dL (ref 70–99)
Potassium: 4.6 mmol/L (ref 3.5–5.1)
Sodium: 136 mmol/L (ref 135–145)

## 2019-03-22 NOTE — Progress Notes (Signed)
PROGRESS NOTE  Derrick [name] FBP:[id number] DOB: 03/28/1951 DOA: 03/20/2019 PCP: Gareth Morgan, MD   Brief Narrative:  Derrick [name] a 68 y.o.malewith medical history significant forCOPD, pulmonary hypertension, diastolic CHF and rheumatoid arthritis, presented to the [facility] with sudden onset difficulty breathing.He has chronic unchanged mild cough mostly in the morning which is unchanged, no fever no chills.Quit smoking cigarettes 10 to 15 years ago. He uses ~3LO2 as needed mostly at night. Patient reports weight loss, ~ 15 to 20lbs,2 to 3 years ago but none since. On EMS arrival patient's O2 sats was 74% on room air, he was given Solu-Medrol albuterol.  Upon arrival to the ER, O2 sats 84% on6Lnasal cannula,unremarkable CBC, BMP. Unremarkable BNP 75. COVID-19 test negative. Chest x-ray shows large pneumothorax on the right without appreciable tension component. Patient subsequently had pigtail chest tube placed in ED,with improvement in patient's respiration. Repeat chest x-ray showeddecreased right pneumothorax, question small residual atypical component. Minimal right lung base opacity atelectasis versus reexpansion pulmonary edema.  EDPtalked to cardiothoracic surgery and per their recommendation, patient was transferred to [facility] under hospitalist service with CTS consulting.   HPI/Recap of past 24 hours:  He is on 4liter oxygen supplement ,reports on 3liters at home, denies chest pain, no sob at rest, no cough, no edema Chest tube in place  Assessment/Plan: Active Problems:   Spontaneous pneumothorax   Protein-calorie malnutrition, severe  Acute spontaneous right-sided tension pneumothorax: As a consequence of severe bullous emphysema.  Chest tube in place.  Management per CTS.  COPD: Stable.  No wheezes.  Continue bronchodilators.  Chronic diastolic CHF: Compensated.  Last echo in February 2020 shows ejection fraction of 60%.  Resume  carvedilol and Lasix.  Severe malnutrition  Nutrition input appreciated    DVT prophylaxis: SCD Code Status: Full code Family Communication: None present Disposition Plan: TBD  Consultants:   CTS  Procedures:   Chest tube placement  Antimicrobials:   None   Objective: BP 113/76 (BP Location: Right Arm)   Pulse (!) 50   Temp (!) 97.5 F (36.4 C)   Resp 16   Ht 5\' 11"  (1.803 m)   Wt 51.7 kg   SpO2 100%   BMI 15.90 kg/m   Intake/Output Summary (Last 24 hours) at 03/22/2019 1538 Last data filed at 03/22/2019 8242 Gross per 24 hour  Intake 360 ml  Output 900 ml  Net -540 ml   Filed Weights   03/20/19 2250  Weight: 51.7 kg    Exam: Patient is examined daily including today on 03/22/2019, exams remain the same as of yesterday except that has changed    General:  NAD, thin, pleasant  Cardiovascular: RRR  Respiratory: CTABL, + chest tube on right side  Abdomen: Soft/ND/NT, positive BS  Musculoskeletal: No Edema  Neuro: alert, oriented   Data Reviewed: Basic Metabolic Panel: Recent Labs  Lab 03/20/19 1458 03/21/19 0400 03/22/19 0311  NA 139 138 136  K 4.6 5.6* 4.6  CL 98 99 97*  CO2 31 30 31   GLUCOSE 99 113* 91  BUN 22 20 28*  CREATININE 1.01 0.87 0.74  CALCIUM 9.3 9.2 9.1   Liver Function Tests: No results for input(s): AST, ALT, ALKPHOS, BILITOT, PROT, ALBUMIN in the last 168 hours. No results for input(s): LIPASE, AMYLASE in the last 168 hours. No results for input(s): AMMONIA in the last 168 hours. CBC: Recent Labs  Lab 03/20/19 1458 03/21/19 0400 03/22/19 0311  WBC 4.6 6.5 8.0  NEUTROABS  3.0  --  6.3  HGB 16.1 15.9 15.5  HCT 52.8* 50.5 48.5  MCV 102.7* 99.6 98.6  PLT 133* 136* 130*   Cardiac Enzymes:   No results for input(s): CKTOTAL, CKMB, CKMBINDEX, TROPONINI in the last 168 hours. BNP (last 3 results) Recent Labs    10/04/18 1130 03/20/19 1458  BNP 1,874.0* 75.0    ProBNP (last 3 results) No results for  input(s): PROBNP in the last 8760 hours.  CBG: No results for input(s): GLUCAP in the last 168 hours.  Recent Results (from the past 240 hour(s))  SARS Coronavirus 2 [facility] order, Performed in [facility] hospital lab) Nasopharyngeal Nasopharyngeal Swab     Status: None   Collection Time: 03/20/19  2:17 PM   Specimen: Nasopharyngeal Swab  Result Value Ref Range Status   SARS Coronavirus 2 NEGATIVE NEGATIVE Final    Comment: (NOTE) If result is NEGATIVE SARS-CoV-2 target nucleic acids are NOT DETECTED. The SARS-CoV-2 RNA is generally detectable in upper and lower  respiratory specimens during the acute phase of infection. The lowest  concentration of SARS-CoV-2 viral copies this assay can detect is 250  copies / mL. A negative result does not preclude SARS-CoV-2 infection  and should not be used as the sole basis for treatment or other  patient management decisions.  A negative result may occur with  improper specimen collection / handling, submission of specimen other  than nasopharyngeal swab, presence of viral mutation(s) within the  areas targeted by this assay, and inadequate number of viral copies  (<250 copies / mL). A negative result must be combined with clinical  observations, patient history, and epidemiological information. If result is POSITIVE SARS-CoV-2 target nucleic acids are DETECTED. The SARS-CoV-2 RNA is generally detectable in upper and lower  respiratory specimens dur ing the acute phase of infection.  Positive  results are indicative of active infection with SARS-CoV-2.  Clinical  correlation with patient history and other diagnostic information is  necessary to determine patient infection status.  Positive results do  not rule out bacterial infection or co-infection with other viruses. If result is PRESUMPTIVE POSTIVE SARS-CoV-2 nucleic acids MAY BE PRESENT.   A presumptive positive result was obtained on the submitted specimen  and confirmed on repeat  testing.  While 2019 novel coronavirus  (SARS-CoV-2) nucleic acids may be present in the submitted sample  additional confirmatory testing may be necessary for epidemiological  and / or clinical management purposes  to differentiate between  SARS-CoV-2 and other Sarbecovirus currently known to infect humans.  If clinically indicated additional testing with an alternate test  methodology [phone]) is advised. The SARS-CoV-2 RNA is generally  detectable in upper and lower respiratory sp ecimens during the acute  phase of infection. The expected result is Negative. Fact Sheet for Patients:  BoilerBrush.com.cy Fact Sheet for Healthcare Providers: https://pope.com/ This test is not yet approved or cleared by the [place] FDA and has been authorized for detection and/or diagnosis of SARS-CoV-2 by FDA under an Emergency Use Authorization (EUA).  This EUA will remain in effect (meaning this test can be used) for the duration of the COVID-19 declaration under Section 564(b)(1) of the Act, 21 U.S.C. section 360bbb-3(b)(1), unless the authorization is terminated or revoked sooner. Performed at [facility], [address], [place], [place] [address]      Studies: No results found.  Scheduled Meds: . carvedilol  3.125 mg Oral BID  . feeding supplement (ENSURE ENLIVE)  237 mL Oral TID BM  .  fluticasone  1 spray Each Nare Daily  . furosemide  40 mg Oral Daily  . multivitamin with minerals  1 tablet Oral Daily    Continuous Infusions:   Time spent: 26mins I have personally reviewed and interpreted on  03/22/2019 daily labs, tele strips, imagings as discussed above under date review session and assessment and plans.  I reviewed all nursing notes, pharmacy notes, consultant notes,  vitals, pertinent old records  I have discussed plan of care as described above with RN , patient  on 03/22/2019   Florencia Reasons MD, PhD, FACP  [facility]  Pager [phone]. If 7PM-7AM, please contact night-coverage at [email or web address], password [facility] 03/22/2019, 3:38 PM  LOS: 2 days

## 2019-03-23 LAB — CBC
HCT: 48.5 % (ref 39.0–52.0)
Hemoglobin: 15.4 g/dL (ref 13.0–17.0)
MCH: 32 pg (ref 26.0–34.0)
MCHC: 31.8 g/dL (ref 30.0–36.0)
MCV: 100.8 fL — ABNORMAL HIGH (ref 80.0–100.0)
Platelets: 124 10*3/uL — ABNORMAL LOW (ref 150–400)
RBC: 4.81 MIL/uL (ref 4.22–5.81)
RDW: 12.2 % (ref 11.5–15.5)
WBC: 6.4 10*3/uL (ref 4.0–10.5)
nRBC: 0 % (ref 0.0–0.2)

## 2019-03-23 LAB — BASIC METABOLIC PANEL
Anion gap: 11 (ref 5–15)
BUN: 24 mg/dL — ABNORMAL HIGH (ref 8–23)
CO2: 34 mmol/L — ABNORMAL HIGH (ref 22–32)
Calcium: 9.1 mg/dL (ref 8.9–10.3)
Chloride: 90 mmol/L — ABNORMAL LOW (ref 98–111)
Creatinine, Ser: 0.84 mg/dL (ref 0.61–1.24)
GFR calc Af Amer: >60 mL/min (ref >60)
GFR calc non Af Amer: >60 mL/min (ref >60)
Glucose, Bld: 78 mg/dL (ref 70–99)
Potassium: 4 mmol/L (ref 3.5–5.1)
Sodium: 135 mmol/L (ref 135–145)

## 2019-03-23 LAB — MAGNESIUM: Magnesium: 1.6 mg/dL — ABNORMAL LOW (ref 1.7–2.4)

## 2019-03-23 NOTE — Progress Notes (Signed)
PROGRESS NOTE  Derrick [name] HYI:[id number] DOB: Apr 13, 1951 DOA: 03/20/2019 PCP: Gareth Morgan, MD   Brief Narrative:  [name] a 68 y.o.malewith medical history significant forCOPD, pulmonary hypertension, diastolic CHF and rheumatoid arthritis, [facility] with sudden onset difficulty breathing.He has chronic unchanged mild cough mostly in the morning which is unchanged, no fever no chills.Quit smoking cigarettes 10 to 15 years ago. He uses ~3LO2 as needed mostly at night. Patient reports weight loss, ~ 15 to 20lbs,2 to 3 years ago but none since. On EMS arrival patient's O2 sats was 74% on room air, he was given Solu-Medrol albuterol.  Upon arrival to the ER, O2 sats 84% on6Lnasal cannula,unremarkable CBC, BMP. Unremarkable BNP 75. COVID-19 test negative. Chest x-ray shows large pneumothorax on the right without appreciable tension component. Patient subsequently had pigtail chest tube placed in ED,with improvement in patient's respiration. Repeat chest x-ray showeddecreased right pneumothorax, question small residual atypical component. Minimal right lung base opacity atelectasis versus reexpansion pulmonary edema.  EDPtalked to cardiothoracic surgery and per their recommendation, patient was transferred to [facility] under hospitalist service with CVTS consulting.   HPI/Recap of past 24 hours:  Currently on 5 L of oxygen.  He reports using oxygen at home, mainly at night but as needed during the day.  Denies any worsening shortness of breath or cough.  Chest tube in place.  Assessment/Plan: Active Problems:   Acute respiratory failure with hypoxia (HCC)   Spontaneous pneumothorax   Protein-calorie malnutrition, severe   Chest tube in place   Status post chest tube placement [place])  Acute spontaneous right-sided tension pneumothorax: As a consequence of severe bullous emphysema.  Chest tube in place.  Management per CTS.  COPD:  Stable.  No wheezes.  Continue bronchodilators.  Chronic diastolic CHF: Compensated.  Last echo in February 2020 shows ejection fraction of 60%.    Continue on home dose of Lasix.  Hold further carvedilol since blood pressure is borderline (90s) and heart rate is in the low 60s.  Severe malnutrition  Nutrition input appreciated    DVT prophylaxis: SCD Code Status: Full code Family Communication: None present Disposition Plan: TBD  Consultants:   CVTS  Procedures:   Chest tube placement  Antimicrobials:   None   Objective: BP 96/68 (BP Location: Right Arm)   Pulse 62   Temp 98.4 F (36.9 C) (Oral)   Resp 18   Ht 5\' 11"  (1.803 m)   Wt 51.7 kg   SpO2 96%   BMI 15.90 kg/m   Intake/Output Summary (Last 24 hours) at 03/23/2019 1714 Last data filed at 03/23/2019 1647 Gross per 24 hour  Intake 720 ml  Output 1685 ml  Net -965 ml   Filed Weights   03/20/19 2250  Weight: 51.7 kg    Exam: General exam: Alert, awake, oriented x 3 Respiratory system: Clear to auscultation. Respiratory effort normal., +chest tube on right side Cardiovascular system:RRR. No murmurs, rubs, gallops. Gastrointestinal system: Abdomen is nondistended, soft and nontender. No organomegaly or masses felt. Normal bowel sounds heard. Central nervous system: Alert and oriented. No focal neurological deficits. Extremities: No C/C/E, +pedal pulses Skin: No rashes, lesions or ulcers  Psychiatry: Judgement and insight appear normal. Mood & affect appropriate.     Data Reviewed: Basic Metabolic Panel: Recent Labs  Lab 03/20/19 1458 03/21/19 0400 03/22/19 0311 03/23/19 0332  NA 139 138 136 135  K 4.6 5.6* 4.6 4.0  CL 98 99 97* 90*  CO2 31 30 31  34*  GLUCOSE 99 113* 91 78  BUN 22 20 28* 24*  CREATININE 1.01 0.87 0.74 0.84  CALCIUM 9.3 9.2 9.1 9.1  MG  --   --   --  1.6*   Liver Function Tests: No results for input(s): AST, ALT, ALKPHOS, BILITOT, PROT, ALBUMIN in the last 168 hours.  No results for input(s): LIPASE, AMYLASE in the last 168 hours. No results for input(s): AMMONIA in the last 168 hours. CBC: Recent Labs  Lab 03/20/19 1458 03/21/19 0400 03/22/19 0311 03/23/19 0332  WBC 4.6 6.5 8.0 6.4  NEUTROABS 3.0  --  6.3  --   HGB 16.1 15.9 15.5 15.4  HCT 52.8* 50.5 48.5 48.5  MCV 102.7* 99.6 98.6 100.8*  PLT 133* 136* 130* 124*   Cardiac Enzymes:   No results for input(s): CKTOTAL, CKMB, CKMBINDEX, TROPONINI in the last 168 hours. BNP (last 3 results) Recent Labs    10/04/18 1130 03/20/19 1458  BNP 1,874.0* 75.0    ProBNP (last 3 results) No results for input(s): PROBNP in the last 8760 hours.  CBG: No results for input(s): GLUCAP in the last 168 hours.  Recent Results (from the past 240 hour(s))  SARS Coronavirus 2 [facility] order, Performed in [facility] hospital lab) Nasopharyngeal Nasopharyngeal Swab     Status: None   Collection Time: 03/20/19  2:17 PM   Specimen: Nasopharyngeal Swab  Result Value Ref Range Status   SARS Coronavirus 2 NEGATIVE NEGATIVE Final    Comment: (NOTE) If result is NEGATIVE SARS-CoV-2 target nucleic acids are NOT DETECTED. The SARS-CoV-2 RNA is generally detectable in upper and lower  respiratory specimens during the acute phase of infection. The lowest  concentration of SARS-CoV-2 viral copies this assay can detect is 250  copies / mL. A negative result does not preclude SARS-CoV-2 infection  and should not be used as the sole basis for treatment or other  patient management decisions.  A negative result may occur with  improper specimen collection / handling, submission of specimen other  than nasopharyngeal swab, presence of viral mutation(s) within the  areas targeted by this assay, and inadequate number of viral copies  (<250 copies / mL). A negative result must be combined with clinical  observations, patient history, and epidemiological information. If result is POSITIVE SARS-CoV-2 target nucleic acids  are DETECTED. The SARS-CoV-2 RNA is generally detectable in upper and lower  respiratory specimens dur ing the acute phase of infection.  Positive  results are indicative of active infection with SARS-CoV-2.  Clinical  correlation with patient history and other diagnostic information is  necessary to determine patient infection status.  Positive results do  not rule out bacterial infection or co-infection with other viruses. If result is PRESUMPTIVE POSTIVE SARS-CoV-2 nucleic acids MAY BE PRESENT.   A presumptive positive result was obtained on the submitted specimen  and confirmed on repeat testing.  While 2019 novel coronavirus  (SARS-CoV-2) nucleic acids may be present in the submitted sample  additional confirmatory testing may be necessary for epidemiological  and / or clinical management purposes  to differentiate between  SARS-CoV-2 and other Sarbecovirus currently known to infect humans.  If clinically indicated additional testing with an alternate test  methodology [phone]) is advised. The SARS-CoV-2 RNA is generally  detectable in upper and lower respiratory sp ecimens during the acute  phase of infection. The expected result is Negative. Fact Sheet for Patients:  StrictlyIdeas.no Fact Sheet for Healthcare Providers: BankingDealers.co.za This  test is not yet approved or cleared by the [place] FDA and has been authorized for detection and/or diagnosis of SARS-CoV-2 by FDA under an Emergency Use Authorization (EUA).  This EUA will remain in effect (meaning this test can be used) for the duration of the COVID-19 declaration under Section 564(b)(1) of the Act, 21 U.S.C. section 360bbb-3(b)(1), unless the authorization is terminated or revoked sooner. Performed at [facility], [address] [place], [place] [address]      Studies: No results found.  Scheduled Meds: . carvedilol  3.125 mg Oral BID  . feeding  supplement (ENSURE ENLIVE)  237 mL Oral TID BM  . fluticasone  1 spray Each Nare Daily  . furosemide  40 mg Oral Daily  . multivitamin with minerals  1 tablet Oral Daily    Continuous Infusions:   Time spent: 25mins I have personally reviewed and interpreted on  03/23/2019 daily labs, tele strips, imagings as discussed above under date review session and assessment and plans.  I reviewed all nursing notes, pharmacy notes, consultant notes,  vitals, pertinent old records  I have discussed plan of care as described above with RN , patient  on 03/23/2019   Erick BlinksJehanzeb Memon MD  [facility] If 7PM-7AM, please contact night-coverage at [email or web address],  03/23/2019, 5:14 PM  LOS: 3 days

## 2019-03-24 ENCOUNTER — Inpatient Hospital Stay (HOSPITAL_COMMUNITY): Payer: Medicare HMO

## 2019-03-24 DIAGNOSIS — J9311 Primary spontaneous pneumothorax: Secondary | ICD-10-CM

## 2019-03-24 LAB — BASIC METABOLIC PANEL
Anion gap: 8 (ref 5–15)
BUN: 21 mg/dL (ref 8–23)
CO2: 36 mmol/L — ABNORMAL HIGH (ref 22–32)
Calcium: 9.2 mg/dL (ref 8.9–10.3)
Chloride: 95 mmol/L — ABNORMAL LOW (ref 98–111)
Creatinine, Ser: 0.7 mg/dL (ref 0.61–1.24)
GFR calc Af Amer: >60 mL/min (ref >60)
GFR calc non Af Amer: >60 mL/min (ref >60)
Glucose, Bld: 80 mg/dL (ref 70–99)
Potassium: 4.4 mmol/L (ref 3.5–5.1)
Sodium: 139 mmol/L (ref 135–145)

## 2019-03-24 LAB — CBC
HCT: 49.2 % (ref 39.0–52.0)
Hemoglobin: 15.7 g/dL (ref 13.0–17.0)
MCH: 31.7 pg (ref 26.0–34.0)
MCHC: 31.9 g/dL (ref 30.0–36.0)
MCV: 99.4 fL (ref 80.0–100.0)
Platelets: 126 10*3/uL — ABNORMAL LOW (ref 150–400)
RBC: 4.95 MIL/uL (ref 4.22–5.81)
RDW: 12 % (ref 11.5–15.5)
WBC: 6 10*3/uL (ref 4.0–10.5)
nRBC: 0 % (ref 0.0–0.2)

## 2019-03-24 MED ORDER — POLYETHYLENE GLYCOL 3350 17 G PO PACK: 17.0000 g | PACK | Freq: Every day | ORAL | Status: DC | PRN

## 2019-03-24 MED ORDER — IPRATROPIUM-ALBUTEROL 0.5-2.5 (3) MG/3ML IN SOLN
3.0000 mL | Freq: Two times a day (BID) | RESPIRATORY_TRACT | Status: DC
  Administered 2019-03-24 – 2019-03-27 (×6): 3 mL via RESPIRATORY_TRACT
  Filled 2019-03-24 (×6): qty 3

## 2019-03-24 MED ORDER — IPRATROPIUM-ALBUTEROL 0.5-2.5 (3) MG/3ML IN SOLN
3.0000 mL | RESPIRATORY_TRACT | Status: DC | PRN
  Administered 2019-03-25: 3 mL via RESPIRATORY_TRACT
  Filled 2019-03-24: qty 3

## 2019-03-24 MED ORDER — IPRATROPIUM-ALBUTEROL 0.5-2.5 (3) MG/3ML IN SOLN
3.0000 mL | Freq: Four times a day (QID) | RESPIRATORY_TRACT | Status: DC
  Administered 2019-03-24: 3 mL via RESPIRATORY_TRACT
  Filled 2019-03-24: qty 3

## 2019-03-24 MED ORDER — HYDRALAZINE HCL 20 MG/ML IJ SOLN: 10.0000 mg | INTRAMUSCULAR | Status: DC | PRN

## 2019-03-24 MED ORDER — SENNOSIDES-DOCUSATE SODIUM 8.6-50 MG PO TABS: 2.0000 | ORAL_TABLET | Freq: Every evening | ORAL | Status: DC | PRN

## 2019-03-24 NOTE — Progress Notes (Signed)
     [address]       [place] [address]             [phone]       No events  Vitals:   03/23/19 1435 03/24/19 0549  BP: 96/68 108/72  Pulse: 62 (!) 57  Resp: 18 15  Temp: 98.4 F (36.9 C) 97.7 F (36.5 C)  SpO2: 96% 95%   Alert NAD RRR EWOB, no leak  Spontaneous pneumothorax New CXR ordered.  If stable, will remove chest tube today  Kariann Wecker O Kada Friesen

## 2019-03-24 NOTE — Progress Notes (Signed)
     [address]       [place] [address]             [phone]       Chest tube removed. Will order repeat CXR for 6pm  [name] Derrick [name]

## 2019-03-24 NOTE — Progress Notes (Signed)
Notified by tele at 2311 patient had 10 beat run of VTach. RN to bedside. Patient sleeping, no distress. SpO2 97% on L and HR 71. Will continue to monitor  Paged NP Baltazar Najjar at 2321 informed notified by tele at 2311 patient had 10 beat run of VTach. Pt.sleeping, no distress. SpO2 97% on5L [place], HR 71.   Chest tube site (post-removal) assessed at 2224 and 0318, no drainage, no tenderness. Clean and dry. Minimal redness at site. Dressing scant old drainage.

## 2019-03-24 NOTE — Progress Notes (Signed)
PROGRESS NOTE    Derrick [name]  XHB:[id number] DOB: [birth year] DOA: 03/20/2019 PCP: Gareth Morgan, MD   Brief Narrative:  68 y.o.malewith medical history significant forCOPD, pulmonary hypertension, heavy tobacco user, diastolic CHF and rheumatoid arthritis, [facility].Noted to be hypoxic in the ER requiring 6 L nasal cannula.  COVID-negative.  Chest x-ray showed large right-sided pneumothorax requiring chest tube placement.  Repeat x-rays showed improvement.  CT surgery were consulted.   Assessment & Plan:   Active Problems:   Acute respiratory failure with hypoxia (HCC)   Spontaneous pneumothorax   Protein-calorie malnutrition, severe   Chest tube in place   Status post chest tube placement [facility])  Acute spontaneous right-sided pneumothorax with hypoxia - Secondary to severe bullous emphysematous disease.  Right-sided chest tube in place.  Repeat chest x-ray shows improvement in the x-ray- no pneumothorax is seen.  Should be able to remove chest tube we will defer this to cardiothoracic surgery. - Bronchodilators.  Incentive spirometer.  Supplemental oxygen.  History of COPD with active tobacco use - Not active wheezing. bronchodilators.  Chronic diastolic congestive heart failure with preserved ejection fraction, 60% -Continue home medications.  No signs of obvious fluid overload.  Echo in February 2020 showed ejection fraction 60%.  Severe protein calorie malnutrition -Nutrition team consulted.    DVT prophylaxis: SCDs Code Status: Full code Family Communication: None Disposition Plan: Not stable for discharge.  He still has chest tube in place and has exertional shortness of breath  Consultants:   CT surgery  Procedures:   Chest tube placement  Antimicrobials:   None   Subjective: Feels better but even with minimal movement in the bed he gets hypoxic down to 83% on 5 L nasal cannula.  Review of  Systems Otherwise negative except as per HPI, including: General: Denies fever, chills, night sweats or unintended weight loss. Resp: Denies cough, wheezing, shortness of breath. Cardiac: Denies chest pain, palpitations, orthopnea, paroxysmal nocturnal dyspnea. GI: Denies abdominal pain, nausea, vomiting, diarrhea or constipation GU: Denies dysuria, frequency, hesitancy or incontinence MS: Denies muscle aches, joint pain or swelling Neuro: Denies headache, neurologic deficits (focal weakness, numbness, tingling), abnormal gait Psych: Denies anxiety, depression, SI/HI/AVH Skin: Denies new rashes or lesions ID: Denies sick contacts, exotic exposures, travel  Objective: Vitals:   03/23/19 0000 03/23/19 0335 03/23/19 1435 03/24/19 0549  BP: 98/67 97/69 96/68  108/72  Pulse: 67  62 (!) 57  Resp:  20 18 15   Temp: 98.2 F (36.8 C) 98 F (36.7 C) 98.4 F (36.9 C) 97.7 F (36.5 C)  TempSrc: Oral Oral Oral   SpO2: 92% 93% 96% 95%  Weight:      Height:        Intake/Output Summary (Last 24 hours) at 03/24/2019 1223 Last data filed at 03/24/2019 0917 Gross per 24 hour  Intake 582 ml  Output 825 ml  Net -243 ml   Filed Weights   03/20/19 2250  Weight: 51.7 kg    Examination:  General exam: Appears calm and comfortable ;frail. On 5L [place] Respiratory system: b/l diminished BS Cardiovascular system: S1 & S2 heard, RRR. No JVD, murmurs, rubs, gallops or clicks. No pedal edema. Gastrointestinal system: Abdomen is nondistended, soft and nontender. No organomegaly or masses felt. Normal bowel sounds heard. Central nervous system: Alert and oriented. No focal neurological deficits. Extremities: Symmetric 5 x 5 power. Skin: No rashes, lesions or ulcers Psychiatry: Judgement and insight appear normal. Mood & affect appropriate.  Data Reviewed:   CBC: Recent Labs  Lab 03/20/19 1458 03/21/19 0400 03/22/19 0311 03/23/19 0332 03/24/19 0433  WBC 4.6 6.5 8.0 6.4 6.0  NEUTROABS 3.0   --  6.3  --   --   HGB 16.1 15.9 15.5 15.4 15.7  HCT 52.8* 50.5 48.5 48.5 49.2  MCV 102.7* 99.6 98.6 100.8* 99.4  PLT 133* 136* 130* 124* 126*   Basic Metabolic Panel: Recent Labs  Lab 03/20/19 1458 03/21/19 0400 03/22/19 0311 03/23/19 0332 03/24/19 0433  NA 139 138 136 135 139  K 4.6 5.6* 4.6 4.0 4.4  CL 98 99 97* 90* 95*  CO2 31 30 31  34* 36*  GLUCOSE 99 113* 91 78 80  BUN 22 20 28* 24* 21  CREATININE 1.01 0.87 0.74 0.84 0.70  CALCIUM 9.3 9.2 9.1 9.1 9.2  MG  --   --   --  1.6*  --    GFR: Estimated Creatinine Clearance: 66.4 mL/min (by C-G formula based on SCr of 0.7 mg/dL). Liver Function Tests: No results for input(s): AST, ALT, ALKPHOS, BILITOT, PROT, ALBUMIN in the last 168 hours. No results for input(s): LIPASE, AMYLASE in the last 168 hours. No results for input(s): AMMONIA in the last 168 hours. Coagulation Profile: No results for input(s): INR, PROTIME in the last 168 hours. Cardiac Enzymes: No results for input(s): CKTOTAL, CKMB, CKMBINDEX, TROPONINI in the last 168 hours. BNP (last 3 results) No results for input(s): PROBNP in the last 8760 hours. HbA1C: No results for input(s): HGBA1C in the last 72 hours. CBG: No results for input(s): GLUCAP in the last 168 hours. Lipid Profile: No results for input(s): CHOL, HDL, LDLCALC, TRIG, CHOLHDL, LDLDIRECT in the last 72 hours. Thyroid Function Tests: No results for input(s): TSH, T4TOTAL, FREET4, T3FREE, THYROIDAB in the last 72 hours. Anemia Panel: No results for input(s): VITAMINB12, FOLATE, FERRITIN, TIBC, IRON, RETICCTPCT in the last 72 hours. Sepsis Labs: No results for input(s): PROCALCITON, LATICACIDVEN in the last 168 hours.  Recent Results (from the past 240 hour(s))  SARS Coronavirus 2 [facility](Hospital order, Performed in [facility] lab) Nasopharyngeal Nasopharyngeal Swab     Status: None   Collection Time: 03/20/19  2:17 PM   Specimen: Nasopharyngeal Swab  Result Value Ref Range Status   SARS  Coronavirus 2 NEGATIVE NEGATIVE Final    Comment: (NOTE) If result is NEGATIVE SARS-CoV-2 target nucleic acids are NOT DETECTED. The SARS-CoV-2 RNA is generally detectable in upper and lower  respiratory specimens during the acute phase of infection. The lowest  concentration of SARS-CoV-2 viral copies this assay can detect is 250  copies / mL. A negative result does not preclude SARS-CoV-2 infection  and should not be used as the sole basis for treatment or other  patient management decisions.  A negative result may occur with  improper specimen collection / handling, submission of specimen other  than nasopharyngeal swab, presence of viral mutation(s) within the  areas targeted by this assay, and inadequate number of viral copies  (<250 copies / mL). A negative result must be combined with clinical  observations, patient history, and epidemiological information. If result is POSITIVE SARS-CoV-2 target nucleic acids are DETECTED. The SARS-CoV-2 RNA is generally detectable in upper and lower  respiratory specimens dur ing the acute phase of infection.  Positive  results are indicative of active infection with SARS-CoV-2.  Clinical  correlation with patient history and other diagnostic information is  necessary to determine patient infection status.  Positive results  do  not rule out bacterial infection or co-infection with other viruses. If result is PRESUMPTIVE POSTIVE SARS-CoV-2 nucleic acids MAY BE PRESENT.   A presumptive positive result was obtained on the submitted specimen  and confirmed on repeat testing.  While 2019 novel coronavirus  (SARS-CoV-2) nucleic acids may be present in the submitted sample  additional confirmatory testing may be necessary for epidemiological  and / or clinical management purposes  to differentiate between  SARS-CoV-2 and other Sarbecovirus currently known to infect humans.  If clinically indicated additional testing with an alternate test    methodology [phone]) is advised. The SARS-CoV-2 RNA is generally  detectable in upper and lower respiratory sp ecimens during the acute  phase of infection. The expected result is Negative. Fact Sheet for Patients:  StrictlyIdeas.no Fact Sheet for Healthcare Providers: BankingDealers.co.za This test is not yet approved or cleared by the [place] FDA and has been authorized for detection and/or diagnosis of SARS-CoV-2 by FDA under an Emergency Use Authorization (EUA).  This EUA will remain in effect (meaning this test can be used) for the duration of the COVID-19 declaration under Section 564(b)(1) of the Act, 21 U.S.C. section 360bbb-3(b)(1), unless the authorization is terminated or revoked sooner. Performed at [facility], [address] [place], [place] [address]          Radiology Studies: Dg Chest [facility] 1 View  Result Date: 03/24/2019 CLINICAL DATA:  68 year old male with right chest tube EXAM: PORTABLE CHEST 1 VIEW COMPARISON:  Multiple prior most recent March 21, 2019 FINDINGS: Cardiomediastinal silhouette unchanged, incompletely imaged. Unchanged position of right thoracostomy tube. No visualized pneumothorax. Emphysematous changes again noted with chronic interstitial opacities bilaterally. No new confluent airspace disease. IMPRESSION: Unchanged right-sided thoracostomy tube with no pneumothorax. Electronically Signed   By: Corrie Mckusick D.O.   On: 03/24/2019 09:39        Scheduled Meds:  feeding supplement (ENSURE ENLIVE)  237 mL Oral TID BM   fluticasone  1 spray Each Nare Daily   furosemide  40 mg Oral Daily   multivitamin with minerals  1 tablet Oral Daily   Continuous Infusions:   LOS: 4 days   Time spent= 35 mins    Jadrien Narine Arsenio Loader, MD [facility]  If 7PM-7AM, please contact night-coverage [email or web address] 03/24/2019, 12:23 PM

## 2019-03-24 NOTE — Care Management Important Message (Signed)
Important Message  Patient Details  Name: Derrick [name] MRN: [id number] Date of Birth: 1950/09/21   Medicare Important Message Given:  Yes     [name] 03/24/2019, 3:05 PM

## 2019-03-25 ENCOUNTER — Inpatient Hospital Stay (HOSPITAL_COMMUNITY): Payer: Medicare HMO

## 2019-03-25 LAB — CBC
HCT: 48.2 % (ref 39.0–52.0)
Hemoglobin: 15.3 g/dL (ref 13.0–17.0)
MCH: 31.8 pg (ref 26.0–34.0)
MCHC: 31.7 g/dL (ref 30.0–36.0)
MCV: 100.2 fL — ABNORMAL HIGH (ref 80.0–100.0)
Platelets: 124 10*3/uL — ABNORMAL LOW (ref 150–400)
RBC: 4.81 MIL/uL (ref 4.22–5.81)
RDW: 12 % (ref 11.5–15.5)
WBC: 6.7 10*3/uL (ref 4.0–10.5)
nRBC: 0 % (ref 0.0–0.2)

## 2019-03-25 LAB — COMPREHENSIVE METABOLIC PANEL
ALT: 11 U/L (ref 0–44)
AST: 17 U/L (ref 15–41)
Albumin: 3.3 g/dL — ABNORMAL LOW (ref 3.5–5.0)
Alkaline Phosphatase: 59 U/L (ref 38–126)
Anion gap: 11 (ref 5–15)
BUN: 24 mg/dL — ABNORMAL HIGH (ref 8–23)
CO2: 36 mmol/L — ABNORMAL HIGH (ref 22–32)
Calcium: 9.4 mg/dL (ref 8.9–10.3)
Chloride: 90 mmol/L — ABNORMAL LOW (ref 98–111)
Creatinine, Ser: 0.88 mg/dL (ref 0.61–1.24)
GFR calc Af Amer: >60 mL/min (ref >60)
GFR calc non Af Amer: >60 mL/min (ref >60)
Glucose, Bld: 89 mg/dL (ref 70–99)
Potassium: 4.4 mmol/L (ref 3.5–5.1)
Sodium: 137 mmol/L (ref 135–145)
Total Bilirubin: 1.2 mg/dL (ref 0.3–1.2)
Total Protein: 6.1 g/dL — ABNORMAL LOW (ref 6.5–8.1)

## 2019-03-25 LAB — BLOOD GAS, ARTERIAL
Acid-Base Excess: 14.5 mmol/L — ABNORMAL HIGH (ref 0.0–2.0)
Bicarbonate: 40.5 mmol/L — ABNORMAL HIGH (ref 20.0–28.0)
Drawn by: 42783
FIO2: 100
O2 Saturation: 93.5 %
Patient temperature: 98
pCO2 arterial: 71.6 mmHg (ref 32.0–48.0)
pH, Arterial: 7.369 (ref 7.350–7.450)
pO2, Arterial: 72.7 mmHg — ABNORMAL LOW (ref 83.0–108.0)

## 2019-03-25 LAB — MAGNESIUM: Magnesium: 1.7 mg/dL (ref 1.7–2.4)

## 2019-03-25 MED ORDER — MIDAZOLAM HCL 2 MG/2ML IJ SOLN
INTRAMUSCULAR | Status: AC
  Filled 2019-03-25: qty 2

## 2019-03-25 MED ORDER — OXYCODONE HCL 5 MG PO TABS
10.0000 mg | ORAL_TABLET | ORAL | Status: DC | PRN
  Administered 2019-03-25 – 2019-03-28 (×6): 10 mg via ORAL
  Filled 2019-03-25 (×6): qty 2

## 2019-03-25 MED ORDER — LIDOCAINE HCL 1 % IJ SOLN
30.0000 mL | Freq: Once | INTRAMUSCULAR | Status: DC
  Filled 2019-03-25 (×2): qty 30

## 2019-03-25 MED ORDER — FENTANYL CITRATE (PF) 100 MCG/2ML IJ SOLN
INTRAMUSCULAR | Status: AC
  Filled 2019-03-25: qty 2

## 2019-03-25 NOTE — Progress Notes (Signed)
1720: Patient c/o SOB. Sats 70s on 10L HF. Increased to 15L HF, sats high 70s, low 80s. Patient placed on 15L NRB. Sats slowly increased to 88%. Rapid nurse called as well as RT and MD to update. Orders obtained.

## 2019-03-25 NOTE — Evaluation (Signed)
Physical Therapy Evaluation Patient Details Name: Derrick [name] MRN: [id number] DOB: [birth year] Today's Date: 03/25/2019   History of Present Illness  68 y.o. male with medical history significant for COPD, pulmonary hypertension, heavy tobacco user, diastolic CHF and rheumatoid arthritis, presented to the [facility] with sudden onset difficulty breathing.  Noted to be hypoxic in the ER requiring 6 L nasal cannula.  COVID-negative.  Chest x-ray showed large right-sided pneumothorax requiring chest tube placement. Chest tube dc'd evening of 8/17  Clinical Impression   Pt admitted with above diagnosis. Managing independently with cane prn and supplemental O2 (typically uses at night) prior to admission; Presents with decr activity tolerance, tendency to desat with activity (even with supplemental O2);  Pt currently with functional limitations due to the deficits listed below (see PT Problem List). Pt will benefit from skilled PT to increase their independence and safety with mobility to allow discharge to the venue listed below.    Cues to self-monitor for activity tolerance with amb; Initiated amb on Room Air and O2 sats decr to 87%; restarted supplemental O2 and then titrated up to 6L to keep O2 sats safe and acceptable with activity     Follow Up Recommendations Other (comment)(HHRN for chronic disease management; Outpt Pulmonary Rehab)    Equipment Recommendations  None recommended by PT    Recommendations for Other Services       Precautions / Restrictions Precautions Precautions: Other (comment) Precaution Comments: Desats with activity Restrictions Weight Bearing Restrictions: No      Mobility  Bed Mobility Overal bed mobility: Independent                Transfers Overall transfer level: Needs assistance Equipment used: None Transfers: Sit to/from Stand Sit to Stand: Supervision         General transfer comment: Supervision for safety; mild unstediness  noted  Ambulation/Gait Ambulation/Gait assistance: Min guard Gait Distance (Feet): 100 Feet Assistive device: (pushing Dinamap) Gait Pattern/deviations: Step-through pattern;Decreased step length - right;Decreased step length - left;Decreased stride length     General Gait Details: Cues to self-monitor for activity tolerance; Initiated amb on Room Air and O2 sats decr to 87%; restarted supplemental O2 and then titrated up to 6L to keep O2 sats safe and acceptable with activity  Stairs            Wheelchair Mobility    Modified Rankin (Stroke Patients Only)       Balance                                             Pertinent Vitals/Pain Pain Assessment: No/denies pain    Home Living Family/patient expects to be discharged to:: Private residence Living Arrangements: Parent Available Help at Discharge: Family;Available PRN/intermittently Type of Home: House Home Access: Level entry     Home Layout: One level Home Equipment: Cane - single point      Prior Function Level of Independence: Independent;Independent with assistive device(s)         Comments: Cane prn; drives, grocery shops     Hand Dominance        Extremity/Trunk Assessment   Upper Extremity Assessment Upper Extremity Assessment: Defer to OT evaluation    Lower Extremity Assessment Lower Extremity Assessment: Generalized weakness       Communication   Communication: HOH  Cognition Arousal/Alertness: Awake/alert Behavior During Therapy: WFL for  tasks assessed/performed Overall Cognitive Status: Within Functional Limits for tasks assessed                                        General Comments      Exercises     Assessment/Plan    PT Assessment Patient needs continued PT services  PT Problem List Decreased strength;Decreased activity tolerance;Decreased balance;Decreased knowledge of use of DME;Cardiopulmonary status limiting activity        PT Treatment Interventions DME instruction;Gait training;Stair training;Functional mobility training;Therapeutic activities;Therapeutic exercise;Balance training;Patient/family education    PT Goals (Current goals can be found in the Care Plan section)  Acute Rehab PT Goals Patient Stated Goal: Hopes to be home soon PT Goal Formulation: With patient Time For Goal Achievement: 04/08/19 Potential to Achieve Goals: Good    Frequency Min 3X/week   Barriers to discharge        Co-evaluation               AM-PAC PT "6 Clicks" Mobility  Outcome Measure Help needed turning from your back to your side while in a flat bed without using bedrails?: None Help needed moving from lying on your back to sitting on the side of a flat bed without using bedrails?: None Help needed moving to and from a bed to a chair (including a wheelchair)?: None Help needed standing up from a chair using your arms (e.g., wheelchair or bedside chair)?: A Little Help needed to walk in hospital room?: A Little Help needed climbing 3-5 steps with a railing? : A Little 6 Click Score: 21    End of Session Equipment Utilized During Treatment: Oxygen Activity Tolerance: Patient tolerated treatment well Patient left: in bed;with call bell/phone within reach(MD in room) Nurse Communication: Mobility status PT Visit Diagnosis: Unsteadiness on feet (R26.81);Other abnormalities of gait and mobility (R26.89)    Time: 6644-0347 PT Time Calculation (min) (ACUTE ONLY): 27 min   Charges:   PT Evaluation $PT Eval Low Complexity: 1 Low PT Treatments $Gait Training: 8-22 mins        Van Clines, PT  Acute Rehabilitation Services Pager [phone] Office [phone]   Levi Aland 03/25/2019, 9:58 AM

## 2019-03-25 NOTE — Progress Notes (Signed)
RN called RT stating that the pt is SOB and needing a breathing treatment. RN also stated that pt was desating. RT got to bedside to find pt in tripod position, sating in low 80's on a NRB. MD stated he would like a STAT CX-RAY and if all is well place the pt on BiPAP. RT understood and monitoring pt at this time.

## 2019-03-25 NOTE — Significant Event (Signed)
Rapid Response Event Note  Overview: Time Called: 1191 Arrival Time: 1718 Event Type: Respiratory  Initial Focused Assessment: Patient with increased work of breathing.  Desat 70% on [place] Placed on NRB O2 sats 92% Patient alert and sitting on the side of the bed Decreased lung sounds on the right   Interventions: PRN respiratory treatment given PCXR done Able to have conversation on the phone.  While sitting in the bed 50% pneumothorax per radiology MD notified Dr Cyndia Bent at bedside to place CT.  Patient tolerated well Oxycodone given for pain post procedure. PCXR done post procedure Placed on 6 L [place]  O2 sats 96%   Plan of Care (if not transferred): RN to call if assistance needed Event Summary: Name of Physician Notified: Reesa Chew at 1715  Name of Consulting Physician Notified: Cyndia Bent at [place]  Outcome: Stayed in room and stabalized  Event End Time: 1940  Raliegh Ip

## 2019-03-25 NOTE — Progress Notes (Signed)
Nutrition Follow-up  RD working remotely.  DOCUMENTATION CODES:   Severe malnutrition in context of chronic illness, Underweight  INTERVENTION:   -Continue Ensure Enlive po TID, each supplement provides 350 kcal and 20 grams of protein -Continue MVI with minerals daily -Continue Magic cup TID with meals, each supplement provides 290 kcal and 9 grams of protein -Continue dysphagia 3 diet (advanced mechanical soft) for ease of intake, as pt is missing several teeth  NUTRITION DIAGNOSIS:   Severe Malnutrition related to chronic illness(COPD) as evidenced by severe muscle depletion, severe fat depletion, percent weight loss.  Ongoing  GOAL:   Patient will meet greater than or equal to 90% of their needs  Progressing   MONITOR:   PO intake, Supplement acceptance, Labs, Weight trends, Skin, I & O's  REASON FOR ASSESSMENT:   Other (Comment)    ASSESSMENT:   Derrick [name] is a 68 y.o. male with medical history significant for COPD, pulmonary hypertension, diastolic CHF and rheumatoid arthritis, presented to the ED with sudden onset difficulty breathing.  Patient said he mowed his lawn today, and went to the grocery store and was on his way back he suddenly had difficulty breathing.  He has chronic unchanged mild cough mostly in the morning is unchanged, no fever no chills.  No Chest pain.  Quit smoking cigarettes 10 to 15 years ago.  He uses ~3L O2 as needed mostly at night.  Patient reports weight loss, ~ 15 to 20lbs, 2 to 3 years ago but none since.  8/13- rt chest tube placed 8/17- chest tube removed 8/18- no pneumothorax on repeat CXR  Reviewed I/O's: -813 ml x 24 hours and -2.2 L since admission  UOP: 1.3 L x 24 hours  Per MD notes, pt not ready for discharge due to exertional hypoxia and shortness of breath.   Pt with good appetite. Meal completion 50-100%. Pt is consuming Ensure supplements and MVI per MAR.   Per CVTS notes, recommending outpatient pulmonary rehab.    Labs reviewed: Mg: 1.6.   Diet Order:   Diet Order            DIET DYS 3 Room service appropriate? Yes with Assist; Fluid consistency: Thin  Diet effective now              EDUCATION NEEDS:   Education needs have been addressed  Skin:  Skin Assessment: Reviewed RN Assessment  Last BM:  03/24/19  Height:   Ht Readings from Last 1 Encounters:  03/20/19 5\' 11"  (1.803 m)    Weight:   Wt Readings from Last 1 Encounters:  03/25/19 50 kg    Ideal Body Weight:  78.2 kg  BMI:  Body mass index is 15.37 kg/m.  Estimated Nutritional Needs:   Kcal:  2050-2250  Protein:  105-120 grams  Fluid:  > 2 L    Cathy Ropp A. Jimmye Norman, RD, LDN, [place] Registered Dietitian II Certified Diabetes Care and Education Specialist Pager: [phone] After hours Pager: [phone]

## 2019-03-25 NOTE — CV Procedure (Signed)
Chest Tube Insertion Procedure Note  Indications:  Clinically significant right Pneumothorax  Pre-operative Diagnosis: Right Pneumothorax  Post-operative Diagnosis: Right Pneumothorax  Procedure Details  Informed consent was obtained for the procedure.  Risks of lung perforation, hemorrhage, arrhythmia, and adverse drug reaction were discussed.   After sterile skin prep, using standard technique, a 28 French tube was placed in the right lateral 7th rib space.  Findings: None  Estimated Blood Loss:  Minimal         Specimens:  None              Complications:  None; patient tolerated the procedure well.         Disposition: procedure performed in patient's room         Condition: stable  Attending Attestation: I performed the procedure.

## 2019-03-25 NOTE — Progress Notes (Signed)
RT called Derrick Lack, MD with a critical result of the pt's ABG. PH: 7.36 CO2: 71.6 PaO2: 72.7 HCO: 40.5 Per MD, because of pt's CX-RAY, BiPAP will not be initiated. Pt is maintaining well on 15L NRB at this time. RN made aware. RT will continue to monitor pt.

## 2019-03-25 NOTE — Progress Notes (Signed)
PROGRESS NOTE    Derrick [name]  ACZ:[id number] DOB: 08/22/1950 DOA: 03/20/2019 PCP: Gareth Morgan, MD   Brief Narrative:  68 y.o.malewith medical history significant forCOPD, pulmonary hypertension, heavy tobacco user, diastolic CHF and rheumatoid arthritis, [facility].Noted to be hypoxic in the ER requiring 6 L nasal cannula.  COVID-negative.  Chest x-ray showed large right-sided pneumothorax requiring chest tube placement.  Repeat x-rays showed improvement.  CT surgery were consulted.   Assessment & Plan:   Active Problems:   Acute respiratory failure with hypoxia (HCC)   Spontaneous pneumothorax   Protein-calorie malnutrition, severe   Chest tube in place   Status post chest tube placement [facility])  Acute spontaneous right-sided pneumothorax with hypoxia; resolved. 3L [place] - Secondary to severe bullous emphysematous disease.  Right-sided chest tube in place.  Repeat chest x-ray shows improvement in the x-ray- no pneumothorax is seen.  Chest tube removed 8/17 by cardiothoracic surgery. - Continue Bronchodilators.  Incentive spirometer.  Supplemental oxygen..  Will benefit from outpatient pulmonary rehab  Significant amount of Hypoxia with ambulation. Suspect this will more or less remain his baseline. I have advised him to increase his oxygen with exertion. Otherwise stable at rest on 3L.   History of COPD with active tobacco use - Cont Bronchodilators. Not wheezing.   Chronic diastolic congestive heart failure with preserved ejection fraction, 60% -Continue home medications.  No signs of obvious fluid overload.  Echo in February 2020 showed ejection fraction 60%.  Severe protein calorie malnutrition -Nutrition team consulted.  DVT prophylaxis: SCDs Code Status: Full code Family Communication: None Disposition Plan: Feels a little better but not ready for discharge. Still gets exertional hypoxia and shortness of breath.    Consultants:   CT surgery  Procedures:   Chest tube placement  Antimicrobials:   None   Subjective: Overall feels ok.  Desaturates to 87% with ambulation.  At rest saturating about 90% on 3 L nasal cannula.  Has mild exertional shortness of breath.  Review of Systems Otherwise negative except as per HPI, including: General = no fevers, chills, dizziness, malaise, fatigue HEENT/EYES = negative for pain, redness, loss of vision, double vision, blurred vision, loss of hearing, sore throat, hoarseness, dysphagia Cardiovascular= negative for chest pain, palpitation, murmurs, lower extremity swelling Respiratory/lungs= negative for shortness of breath, cough, hemoptysis, wheezing, mucus production Gastrointestinal= negative for nausea, vomiting,, abdominal pain, melena, hematemesis Genitourinary= negative for Dysuria, Hematuria, Change in Urinary Frequency MSK = Negative for arthralgia, myalgias, Back Pain, Joint swelling  Neurology= Negative for headache, seizures, numbness, tingling  Psychiatry= Negative for anxiety, depression, suicidal and homocidal ideation Allergy/Immunology= Medication/Food allergy as listed  Skin= Negative for Rash, lesions, ulcers, itching   Objective: Vitals:   03/25/19 0423 03/25/19 0424 03/25/19 0752 03/25/19 0909  BP: 104/75   117/79  Pulse: (!) 58   70  Resp: 18     Temp: 98 F (36.7 C)   98 F (36.7 C)  TempSrc: Oral     SpO2: 100%  93% 96%  Weight:  50 kg    Height:        Intake/Output Summary (Last 24 hours) at 03/25/2019 1229 Last data filed at 03/25/2019 0910 Gross per 24 hour  Intake 240 ml  Output 1400 ml  Net -1160 ml   Filed Weights   03/20/19 2250 03/25/19 0424  Weight: 51.7 kg 50 kg    Examination: Constitutional: NAD, calm, comfortable; frail. 3L [place].  Eyes: PERRL, lids and  conjunctivae normal ENMT: Mucous membranes are moist. Posterior pharynx clear of any exudate or lesions.Normal dentition.  Neck: normal, supple, no  masses, no thyromegaly Respiratory: clear to auscultation bilaterally, no wheezing, no crackles. Normal respiratory effort. No accessory muscle use.  Cardiovascular: Regular rate and rhythm, no murmurs / rubs / gallops. No extremity edema. 2+ pedal pulses. No carotid bruits.  Abdomen: no tenderness, no masses palpated. No hepatosplenomegaly. Bowel sounds positive.  Musculoskeletal: no clubbing / cyanosis. No joint deformity upper and lower extremities. Good ROM, no contractures. Normal muscle tone.  Skin: no rashes, lesions, ulcers. No induration Neurologic: CN 2-12 grossly intact. Sensation intact, DTR normal. Strength 5/5 in all 4.  Psychiatric: Normal judgment and insight. Alert and oriented x 3. Normal mood.    Data Reviewed:   CBC: Recent Labs  Lab 03/20/19 1458 03/21/19 0400 03/22/19 0311 03/23/19 0332 03/24/19 0433 03/25/19 0309  WBC 4.6 6.5 8.0 6.4 6.0 6.7  NEUTROABS 3.0  --  6.3  --   --   --   HGB 16.1 15.9 15.5 15.4 15.7 15.3  HCT 52.8* 50.5 48.5 48.5 49.2 48.2  MCV 102.7* 99.6 98.6 100.8* 99.4 100.2*  PLT 133* 136* 130* 124* 126* 124*   Basic Metabolic Panel: Recent Labs  Lab 03/21/19 0400 03/22/19 0311 03/23/19 0332 03/24/19 0433 03/25/19 0309  NA 138 136 135 139 137  K 5.6* 4.6 4.0 4.4 4.4  CL 99 97* 90* 95* 90*  CO2 30 31 34* 36* 36*  GLUCOSE 113* 91 78 80 89  BUN 20 28* 24* 21 24*  CREATININE 0.87 0.74 0.84 0.70 0.88  CALCIUM 9.2 9.1 9.1 9.2 9.4  MG  --   --  1.6*  --  1.7   GFR: Estimated Creatinine Clearance: 58.4 mL/min (by C-G formula based on SCr of 0.88 mg/dL). Liver Function Tests: Recent Labs  Lab 03/25/19 0309  AST 17  ALT 11  ALKPHOS 59  BILITOT 1.2  PROT 6.1*  ALBUMIN 3.3*   No results for input(s): LIPASE, AMYLASE in the last 168 hours. No results for input(s): AMMONIA in the last 168 hours. Coagulation Profile: No results for input(s): INR, PROTIME in the last 168 hours. Cardiac Enzymes: No results for input(s): CKTOTAL,  CKMB, CKMBINDEX, TROPONINI in the last 168 hours. BNP (last 3 results) No results for input(s): PROBNP in the last 8760 hours. HbA1C: No results for input(s): HGBA1C in the last 72 hours. CBG: No results for input(s): GLUCAP in the last 168 hours. Lipid Profile: No results for input(s): CHOL, HDL, LDLCALC, TRIG, CHOLHDL, LDLDIRECT in the last 72 hours. Thyroid Function Tests: No results for input(s): TSH, T4TOTAL, FREET4, T3FREE, THYROIDAB in the last 72 hours. Anemia Panel: No results for input(s): VITAMINB12, FOLATE, FERRITIN, TIBC, IRON, RETICCTPCT in the last 72 hours. Sepsis Labs: No results for input(s): PROCALCITON, LATICACIDVEN in the last 168 hours.  Recent Results (from the past 240 hour(s))  SARS Coronavirus 2 [facility](Hospital order, [facility]) Nasopharyngeal Nasopharyngeal Swab     Status: None   Collection Time: 03/20/19  2:17 PM   Specimen: Nasopharyngeal Swab  Result Value Ref Range Status   SARS Coronavirus 2 NEGATIVE NEGATIVE Final    Comment: (NOTE) If result is NEGATIVE SARS-CoV-2 target nucleic acids are NOT DETECTED. The SARS-CoV-2 RNA is generally detectable in upper and lower  respiratory specimens during the acute phase of infection. The lowest  concentration of SARS-CoV-2 viral copies this assay can detect is 250  copies /  mL. A negative result does not preclude SARS-CoV-2 infection  and should not be used as the sole basis for treatment or other  patient management decisions.  A negative result may occur with  improper specimen collection / handling, submission of specimen other  than nasopharyngeal swab, presence of viral mutation(s) within the  areas targeted by this assay, and inadequate number of viral copies  (<250 copies / mL). A negative result must be combined with clinical  observations, patient history, and epidemiological information. If result is POSITIVE SARS-CoV-2 target nucleic acids are DETECTED. The SARS-CoV-2 RNA  is generally detectable in upper and lower  respiratory specimens dur ing the acute phase of infection.  Positive  results are indicative of active infection with SARS-CoV-2.  Clinical  correlation with patient history and other diagnostic information is  necessary to determine patient infection status.  Positive results do  not rule out bacterial infection or co-infection with other viruses. If result is PRESUMPTIVE POSTIVE SARS-CoV-2 nucleic acids MAY BE PRESENT.   A presumptive positive result was obtained on the submitted specimen  and confirmed on repeat testing.  While 2019 novel coronavirus  (SARS-CoV-2) nucleic acids may be present in the submitted sample  additional confirmatory testing may be necessary for epidemiological  and / or clinical management purposes  to differentiate between  SARS-CoV-2 and other Sarbecovirus currently known to infect humans.  If clinically indicated additional testing with an alternate test  methodology [phone]([id number]) is advised. The SARS-CoV-2 RNA is generally  detectable in upper and lower respiratory sp ecimens during the acute  phase of infection. The expected result is Negative. Fact Sheet for Patients:  BoilerBrush.com.cyhttps://www.fda.gov/media/136312/download Fact Sheet for Healthcare Providers: https://pope.com/https://www.fda.gov/media/136313/download This test is not yet approved or cleared by the [place] FDA and has been authorized for detection and/or diagnosis of SARS-CoV-2 by FDA under an Emergency Use Authorization (EUA).  This EUA will remain in effect (meaning this test can be used) for the duration of the COVID-19 declaration under Section 564(b)(1) of the Act, 21 U.S.C. section 360bbb-3(b)(1), unless the authorization is terminated or revoked sooner. Performed at [facility], [address] [place], [place] [address]          Radiology Studies: [facility]  Result Date: 03/24/2019 CLINICAL DATA:  Status post right chest tube removal EXAM:  PORTABLE CHEST 1 VIEW COMPARISON:  Film from earlier in the same day. FINDINGS: Previously seen pigtail catheter has been removed in the interval. No significant recurrent pneumothorax is noted. Emphysematous changes are noted in the apices bilaterally. No new focal infiltrate is seen. No bony abnormality is noted. IMPRESSION: No pneumothorax following pigtail removal. Electronically Signed   By: Alcide CleverMark  Lukens M.D.   On: 03/24/2019 16:07   Dg Chest Port 1 View  Result Date: 03/24/2019 CLINICAL DATA:  68 year old male with right chest tube EXAM: PORTABLE CHEST 1 VIEW COMPARISON:  Multiple prior most recent March 21, 2019 FINDINGS: Cardiomediastinal silhouette unchanged, incompletely imaged. Unchanged position of right thoracostomy tube. No visualized pneumothorax. Emphysematous changes again noted with chronic interstitial opacities bilaterally. No new confluent airspace disease. IMPRESSION: Unchanged right-sided thoracostomy tube with no pneumothorax. Electronically Signed   By: Gilmer MorJaime  Wagner D.O.   On: 03/24/2019 09:39        Scheduled Meds: . feeding supplement (ENSURE ENLIVE)  237 mL Oral TID BM  . fluticasone  1 spray Each Nare Daily  . furosemide  40 mg Oral Daily  . ipratropium-albuterol  3 mL Nebulization BID  .  multivitamin with minerals  1 tablet Oral Daily   Continuous Infusions:   LOS: 5 days   Time spent= 25 mins    Ankit Arsenio Loader, MD [facility]  If 7PM-7AM, please contact night-coverage [email or web address] 03/25/2019, 12:29 PM

## 2019-03-25 NOTE — Progress Notes (Signed)
     [address]       [place] [address]             [phone]       No pneumothorax on repeat CXR Risk of recurrence 30-40% Will need follow-up with pulmonary medicine  Please call with questions. Nicola Heinemann Bary Leriche

## 2019-03-25 NOTE — Progress Notes (Signed)
Called by the nursing staff as patient was becoming increasingly hypoxic.  Rapid response was called.  Patient was placed on nonrebreather. Spoke with rapid response nurse.  ABG was ordered which showed pH of 7.3, PCO2 of 71.  Patient was somewhat comfortable on nonrebreather.  Stat chest x-ray showed large right-sided recurrent pneumothorax with tension. Spoke with cardiothoracic surgeon, Dr. Cyndia Bent.  Chest tube was placed emergently.  Repeat chest x-ray showed improvement.  Case discussed with patient's RN and charge nurse as well.  Gerlean Ren MD

## 2019-03-25 NOTE — Evaluation (Signed)
Occupational Therapy Evaluation Patient Details Name: MARZELL ISAKSON MRN: [id number] DOB: 1951-03-07 Today's Date: 03/25/2019    History of Present Illness 68 y.o. male with medical history significant for COPD, pulmonary hypertension, heavy tobacco user, diastolic CHF and rheumatoid arthritis, presented to the [facility] with sudden onset difficulty breathing.  Noted to be hypoxic in the ER requiring 6 L nasal cannula.  COVID-negative.  Chest x-ray showed large right-sided pneumothorax requiring chest tube placement. Chest tube dc'd evening of 8/17   Clinical Impression   Pt admitted with above diagnoses, with pulmonary complications limiting activity tolerance and ability to engage in BADL at desired level of ind. PTA pt ind in community living with mother and other family members. He is over ann independent- supervision with bed mobility and transfers. ECS discussion with pt, pt implying understanding and stating how he prioritizes events in his life, plans his activity, and manages his activity tolerance at baseline. Educated on possible use of 3:1 as shower seat, pt politely declined stated he can manage to stand well this date. Noted pt continuing to desat throughout evaluation to high 80s without cues to manage breath on 5L [place]. At this time no further OT Needs identified. OT will sign off at this time, please reconsult if changes arise. Thank you for this consult.     Follow Up Recommendations  No OT follow up;Other (comment)(Pulmonary rehab OP)    Equipment Recommendations  None recommended by OT;Other (comment)(discussed 3:1 as shower seat, pt declined)    Recommendations for Other Services       Precautions / Restrictions Precautions Precautions: Other (comment) Precaution Comments: Watch O2 Restrictions Weight Bearing Restrictions: No      Mobility Bed Mobility Overal bed mobility: Independent                Transfers Overall transfer level: Needs assistance Equipment  used: None Transfers: Sit to/from Stand Sit to Stand: Supervision         General transfer comment: supervision for safety    Balance Overall balance assessment: Mild deficits observed, not formally tested                                         ADL either performed or assessed with clinical judgement   ADL Overall ADL's : Needs assistance/impaired Eating/Feeding: Set up;Sitting   Grooming: Min guard;Standing   Upper Body Bathing: Set up;Sitting;Standing   Lower Body Bathing: Set up;Sit to/from stand   Upper Body Dressing : Set up;Sitting;Standing   Lower Body Dressing: Set up;Sit to/from stand;Sitting/lateral leans   Toilet Transfer: Min guard;Regular Toilet;RW   Toileting- Clothing Manipulation and Hygiene: Modified independent   Tub/ Engineer, structural: Supervision/safety;Tub bench;Ambulation Tub/Shower Transfer Details (indicate cue type and reason): pt currently stand or washes up at sink pending endurance Functional mobility during ADLs: Supervision/safety;Set up General ADL Comments: pt limited 2/2 O2 sats     Vision Patient Visual Report: No change from baseline       Perception     Praxis      Pertinent Vitals/Pain Pain Assessment: No/denies pain     Hand Dominance     Extremity/Trunk Assessment Upper Extremity Assessment Upper Extremity Assessment: Overall WFL for tasks assessed   Lower Extremity Assessment Lower Extremity Assessment: Defer to PT evaluation       Communication     Cognition Arousal/Alertness: Awake/alert Behavior During Therapy: [facility] for tasks  assessed/performed Overall Cognitive Status: Within Functional Limits for tasks assessed                                     General Comments       Exercises     Shoulder Instructions      Home Living Family/patient expects to be discharged to:: Private residence Living Arrangements: Parent Available Help at Discharge: Family;Available  PRN/intermittently Type of Home: House Home Access: Level entry     Home Layout: One level     Bathroom Shower/Tub: Tub/shower unit         Home Equipment: Cane - single point          Prior Functioning/Environment Level of Independence: Independent;Independent with assistive device(s)        Comments: Cane prn; drives, grocery shops, enjoys deer hunting but hasn't been since becomming sOB        OT Problem List: Decreased knowledge of use of DME or AE;Decreased activity tolerance;Cardiopulmonary status limiting activity      OT Treatment/Interventions:      OT Goals(Current goals can be found in the care plan section) Acute Rehab OT Goals Patient Stated Goal: go home OT Goal Formulation: With patient Time For Goal Achievement: 04/08/19 Potential to Achieve Goals: Good  OT Frequency:     Barriers to D/C:            Co-evaluation              AM-PAC OT "6 Clicks" Daily Activity     Outcome Measure Help from another person eating meals?: None Help from another person taking care of personal grooming?: None Help from another person toileting, which includes using toliet, bedpan, or urinal?: None Help from another person bathing (including washing, rinsing, drying)?: None Help from another person to put on and taking off regular upper body clothing?: None Help from another person to put on and taking off regular lower body clothing?: None 6 Click Score: 24   End of Session Equipment Utilized During Treatment: Gait belt;Oxygen  Activity Tolerance: Patient tolerated treatment well Patient left: in chair;with call bell/phone within reach  OT Visit Diagnosis: Other abnormalities of gait and mobility (R26.89);Muscle weakness (generalized) (M62.81)                Time: 9211-9417 OT Time Calculation (min): 22 min Charges:  OT General Charges $OT Visit: 1 Visit OT Evaluation $OT Eval Low Complexity: 1 Low  Zenovia Jarred, MSOT, OTR/L Behavioral Health  OT/ Acute Relief OT [facility] Office: [place] 03/25/2019, 4:40 PM

## 2019-03-26 DIAGNOSIS — Z515 Encounter for palliative care: Secondary | ICD-10-CM

## 2019-03-26 DIAGNOSIS — Z7189 Other specified counseling: Secondary | ICD-10-CM

## 2019-03-26 DIAGNOSIS — R531 Weakness: Secondary | ICD-10-CM

## 2019-03-26 LAB — COMPREHENSIVE METABOLIC PANEL
ALT: 14 U/L (ref 0–44)
AST: 22 U/L (ref 15–41)
Albumin: 3.6 g/dL (ref 3.5–5.0)
Alkaline Phosphatase: 56 U/L (ref 38–126)
Anion gap: 10 (ref 5–15)
BUN: 24 mg/dL — ABNORMAL HIGH (ref 8–23)
CO2: 40 mmol/L — ABNORMAL HIGH (ref 22–32)
Calcium: 9.8 mg/dL (ref 8.9–10.3)
Chloride: 88 mmol/L — ABNORMAL LOW (ref 98–111)
Creatinine, Ser: 0.7 mg/dL (ref 0.61–1.24)
GFR calc Af Amer: >60 mL/min (ref >60)
GFR calc non Af Amer: >60 mL/min (ref >60)
Glucose, Bld: 107 mg/dL — ABNORMAL HIGH (ref 70–99)
Potassium: 4.5 mmol/L (ref 3.5–5.1)
Sodium: 138 mmol/L (ref 135–145)
Total Bilirubin: 1.4 mg/dL — ABNORMAL HIGH (ref 0.3–1.2)
Total Protein: 6.9 g/dL (ref 6.5–8.1)

## 2019-03-26 LAB — CBC
HCT: 47.9 % (ref 39.0–52.0)
Hemoglobin: 15.2 g/dL (ref 13.0–17.0)
MCH: 31.4 pg (ref 26.0–34.0)
MCHC: 31.7 g/dL (ref 30.0–36.0)
MCV: 99 fL (ref 80.0–100.0)
Platelets: 133 10*3/uL — ABNORMAL LOW (ref 150–400)
RBC: 4.84 MIL/uL (ref 4.22–5.81)
RDW: 11.9 % (ref 11.5–15.5)
WBC: 7.2 10*3/uL (ref 4.0–10.5)
nRBC: 0 % (ref 0.0–0.2)

## 2019-03-26 LAB — MAGNESIUM: Magnesium: 1.8 mg/dL (ref 1.7–2.4)

## 2019-03-26 NOTE — Consult Note (Signed)
Consultation Note Date: 03/26/2019   Patient Name: Derrick [name]  DOB: July 29, 1951  MRN: [id number]  Age / Sex: 68 y.o., male  PCP: Derrick Morgan, MD Referring Physician: Dimple Nanas, MD  Reason for Consultation: Establishing goals of care and Psychosocial/spiritual support  HPI/Patient Profile: 68 y.o. male   admitted on 03/20/2019 with past  medical history significant for COPD, pulmonary hypertension, diastolic CHF and rheumatoid arthritis, presented to the ED with sudden onset difficulty breathing.    Patient said he mowed his lawn today, and went to the grocery store and was on his way back he suddenly had difficulty breathing.    Quit smoking cigarettes 10 to 15 years ago.  He uses ~3L O2 as needed mostly at night.    He was admitted for spontaneous pneumothorax and a pigtail catheter was placed by Dr. Cliffton [name] on 8/14. There was complete resolution of the pneumothorax and the chest tube was subsequently removed on 8/17. On 8/18 the patient had a recurrent right spontaneous pneumothorax and required a non-rebreather. A larger bore chest tube was placed by Dr. Laneta [name]. Follow-up CXR showed almost complete resolution with a trace residual right-sided pneumothorax.    Chest tube-currently on 20cm of suction with no air leak and no drainage in the atrium.   Patient lives at home with his mother.  He has multiple siblings who are his main support persons.  Patient faces treatment option decisions, advanced directive decisions and anticipatory care needs.   Clinical Assessment and Goals of Care:   This NP Derrick [name] reviewed medical records, received report from team, assessed the patient and then meet at the patient's bedside along with his sister/Derrick [name]  to discuss diagnosis, prognosis, GOC, EOL wishes disposition and options.  Concept of Hospice and Palliative Care were discussed  A discussion  was had today regarding advanced directives.  Concepts specific to code status, artifical feeding and hydration, continued IV antibiotics and rehospitalization was had.   Values and goals of care important to patient and family were attempted to be elicited.  Patient is open to all offered and available medical interventions to prolong life.  Hope is to return home at his baseline.  We discussed the seriousness of his medical condition and his high risk for decompensation.  Discussed with patient the importance of continued conversation with his family and the  medical providers regarding overall plan of care and treatment options,  ensuring decisions are within the context of the patients values and GOCs.   No documented healthcare power of attorney or advanced directive.  However today he tells me that if he could not speak for himself he trusts that his Sister Derrick [name] would be his main decision maker.    SUMMARY OF RECOMMENDATIONS    Code Status/Advance Care Planning:  Limited code-documented today.  No chest compression, no cardioversion and no intubation    Symptom Management:   Weakness; PT/OT as tolerated, open to home PT/OT if eligible  Palliative Prophylaxis:   Aspiration, Bowel Regimen, Delirium Protocol,  Frequent Pain Assessment and Oral Care  Additional Recommendations (Limitations, Scope, Preferences):  Full Scope Treatment  Psycho-social/Spiritual:   Desire for further Chaplaincy support:no  Additional Recommendations: Education on Hospice  Prognosis:   Unable to determine  Discharge Planning: Home with Palliative Services      Primary Diagnoses: Present on Admission: . Spontaneous pneumothorax   I have reviewed the medical record, interviewed the patient and family, and examined the patient. The following aspects are pertinent.  Past Medical History:  Diagnosis Date  . Allergic rhinitis due to pollen 09/08/2009  . CHF (congestive heart  failure) ([place])   . COPD (chronic obstructive pulmonary disease) ([place]) 03/14/2013  . 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 Family history of diabetes mellitus 09/08/2009   father  . Family history of stroke (cerebrovascular) 09/08/2009   father  . Nicotine dependence, chewing tobacco, uncomplicated 15/40/0867  . Other disturbances of skin sensation 11/08/2012  . Perennial allergic rhinitis with seasonal variation 11/08/2012  . RA (rheumatoid arthritis) ([place]) 09/08/2009   Social History   Socioeconomic History  . Marital status: Single    Spouse name: Not on file  . Number of children: Not on file  . Years of education: Not on file  . Highest education level: Not on file  Occupational History  . Not on file  Social Needs  . Financial resource strain: Not on file  . Food insecurity    Worry: Not on file    Inability: Not on file  . Transportation needs    Medical: Not on file    Non-medical: Not on file  Tobacco Use  . Smoking status: Former Smoker    Types: Cigarettes  . Smokeless tobacco: Current User    Types: Chew  . Tobacco comment: has chewed tobacco for 40+ years  Substance and Sexual Activity  . Alcohol use: No  . Drug use: No  . Sexual activity: Never  Lifestyle  . Physical activity    Days per week: Not on file    Minutes per session: Not on file  . Stress: Not on file  Relationships  . Social Herbalist on phone: Not on file    Gets together: Not on file    Attends religious service: Not on file    Active member of club or organization: Not on file    Attends meetings of clubs or organizations: Not on file    Relationship status: Not on file  Other Topics Concern  . Not on file  Social History Narrative  . Not on file   Family History  Problem Relation Age of Onset  . Hypertension Mother   . CVA Father   . Heart attack Brother    Scheduled Meds: . feeding supplement (ENSURE ENLIVE)  237 mL Oral TID BM  .  fluticasone  1 spray Each Nare Daily  . furosemide  40 mg Oral Daily  . ipratropium-albuterol  3 mL Nebulization BID  . lidocaine  30 mL Intradermal Once  . multivitamin with minerals  1 tablet Oral Daily   Continuous Infusions: PRN Meds:.acetaminophen **OR** acetaminophen, hydrALAZINE, ipratropium-albuterol, ondansetron **OR** ondansetron (ZOFRAN) IV, oxyCODONE, polyethylene glycol, senna-docusate Medications Prior to Admission:  Prior to Admission medications   Medication Sig Start Date End Date Taking? Authorizing Provider  albuterol (PROVENTIL HFA;VENTOLIN HFA) 108 (90 Base) MCG/ACT inhaler Inhale 1-2 puffs into the lungs every 6 (six) hours as needed for wheezing or shortness of breath. 10/10/18  Yes  Shon Hale, MD  albuterol (PROVENTIL) (2.5 MG/3ML) 0.083% nebulizer solution Take 3 mLs (2.5 mg total) by nebulization every 4 (four) hours as needed for wheezing or shortness of breath. 10/10/18  Yes Emokpae, Courage, MD  carvedilol (COREG) 3.125 MG tablet Take 1 tablet (3.125 mg total) by mouth 2 (two) times daily. 10/10/18 03/20/19 Yes Emokpae, Courage, MD  [employer] VITAMIN B12 1000 MCG tablet Take 1,000 mcg by mouth daily.  05/29/16  Yes [provider]  fluticasone (FLONASE) 50 MCG/ACT nasal spray Place 1 spray into both nostrils daily. Patient taking differently: Place 2 sprays into both nostrils daily.  10/10/18  Yes Emokpae, Courage, MD  furosemide (LASIX) 40 MG tablet 1 (40 mg) Tab po qam and half- Tab (20 mg) qpm Patient taking differently: Take 40 mg by mouth daily.  10/10/18  Yes Emokpae, Courage, MD  lisinopril (PRINIVIL,ZESTRIL) 2.5 MG tablet Take 1 tablet (2.5 mg total) by mouth daily. 10/10/18 03/20/19 Yes Emokpae, Courage, MD  OXYGEN Inhale 3 L into the lungs at bedtime.   Yes [provider]  potassium chloride SA (KLOR-CON M20) 20 MEQ tablet Take 1 tablet (20 mEq total) by mouth daily. 10/10/18  Yes Shon Hale, MD   No Known Allergies Review of Systems   Neurological: Positive for weakness.    Physical Exam Constitutional:      Appearance: He is cachectic.  Cardiovascular:     Rate and Rhythm: Normal rate.  Pulmonary:     Breath sounds: Examination of the right-lower field reveals decreased breath sounds. Decreased breath sounds present.     Vital Signs: BP 107/73 (BP Location: Right Arm)   Pulse 71   Temp 98 F (36.7 C) (Oral)   Resp 17   Ht 5\' 11"  (1.803 m)   Wt 50 kg   SpO2 92%   BMI 15.37 kg/m  Pain Scale: 0-10   Pain Score: 0-No pain   SpO2: SpO2: 92 % O2 Device:SpO2: 92 % O2 Flow Rate: .O2 Flow Rate (L/min): 6 L/min  IO: Intake/output summary:   Intake/Output Summary (Last 24 hours) at 03/26/2019 1336 Last data filed at 03/26/2019 1244 Gross per 24 hour  Intake 120 ml  Output 200 ml  Net -80 ml    LBM: Last BM Date: 03/24/19 Baseline Weight: Weight: 51.7 kg Most recent weight: Weight: 50 kg     Palliative Assessment/Data: 40%   Discussed with Dr Nelson Chimes   This nurse practitioner informed  the patient/family and the attending that I will be out of the hospital until Monday morning.  If the patient is still hospitalized I will follow-up at that time.  Call palliative medicine team phone # [phone] with questions or concerns.   Time In: 1420 Time Out: 1530 Time Total: 70 minutes Greater than 50%  of this time was spent counseling and coordinating care related to the above assessment and plan.  Signed by: Derrick Creed, NP   Please contact Palliative Medicine Team phone at [phone] for questions and concerns.  For individual provider: See Loretha Stapler

## 2019-03-26 NOTE — Plan of Care (Signed)
  Problem: Activity: Goal: Risk for activity intolerance will decrease Outcome: Not Progressing Note: O2 demands severely increase with activity. Chest tube in place. O2 monitored.

## 2019-03-26 NOTE — Progress Notes (Addendum)
      [name]       Derrick [name],Derrick [name] [id number]             [phone]         Subjective: The patient shares that he is having some pain at the chest tube site. Reminded about using his incentive spirometer.   Objective: Vital signs in last 24 hours: Temp:  [97.6 F (36.4 C)-98 F (36.7 C)] 97.6 F (36.4 C) (08/19 0634) Pulse Rate:  [70-83] 81 (08/19 0634) Cardiac Rhythm: Normal sinus rhythm (08/18 1901) Resp:  [18-20] 18 (08/19 0634) BP: (112-131)/(77-85) 112/85 (08/19 0634) SpO2:  [75 %-99 %] 97 % (08/19 0634) FiO2 (%):  [100 %] 100 % (08/18 1802)     Intake/Output from previous day: 08/18 0701 - 08/19 0700 In: 120 [P.O.:120] Out: 125 [Urine:125] Intake/Output this shift: No intake/output data recorded.  General appearance: alert, cooperative and no distress Heart: regular rate and rhythm, S1, S2 normal, no murmur, click, rub or gallop Lungs: clear to auscultation bilaterally and diminished in the lower lobes Abdomen: soft, non-tender; bowel sounds normal; no masses,  no organomegaly Extremities: extremities normal, atraumatic, no cyanosis or edema Wound: chest tube site is dry  Lab Results: Recent Labs    03/25/19 0309 03/26/19 0301  WBC 6.7 7.2  HGB 15.3 15.2  HCT 48.2 47.9  PLT 124* 133*   BMET:  Recent Labs    03/25/19 0309 03/26/19 0301  NA 137 138  K 4.4 4.5  CL 90* 88*  CO2 36* 40*  GLUCOSE 89 107*  BUN 24* 24*  CREATININE 0.88 0.70  CALCIUM 9.4 9.8    PT/INR: No results for input(s): LABPROT, INR in the last 72 hours. ABG    Component Value Date/Time   PHART 7.369 03/25/2019 1753   HCO3 40.5 (H) 03/25/2019 1753   O2SAT 93.5 03/25/2019 1753   CBG (last 3)  No results for input(s): GLUCAP in the last 72 hours.  Assessment/Plan:  This patient is a 68 year old male with a past medical history of COPD and tobacco dependence. He was admitted for spontaneous pneumothorax and a pigtail catheter was placed by Dr. Kipp Brood  on 8/14. There was complete resolution of the pneumothorax and the chest tube was subsequently removed on 8/17. On 8/18 the patient had a recurrent right spontaneous pneumothorax and required a non-rebreather. A larger bore chest tube was placed by Dr. Cyndia Bent. Follow-up CXR showed almost complete resolution with a trace residual right-sided pneumothorax.   1. Chest tube-currently on 20cm of suction with no air leak and no drainage in the atrium. CXR showed almost complete resolution with a trace residual right-sided pneumothorax. 2. Pulm-Severe COPD with a history of smoking. Patient is tolerating 6L [place]. Encouraged incentive spirometer hourly.  3. DVT prophylaxis-SCDs in place 4. CV- NSR in the 80s. BP well controlled 5. Continue on home dose of Lasix-40mg  daily 6. Continue Oxycodone PRN for pain at the chest tube site.   Plan: Leave chest tube to suction for now. Will order a repeat CXR for the morning. Encouraged incentive spirometer use. No air leak at this time. Weaning oxygen as able.    LOS: 6 days    Derrick [name] 03/26/2019  Chart reviewed, patient examined, agree with above. CXR looks good with no ptx. He has severe emphysema on CXR.  There is no air leak. Will keep to suction today and if CXR ok in am will put to water seal.

## 2019-03-26 NOTE — Progress Notes (Signed)
PROGRESS NOTE    Derrick [name]  AYT:[id number] DOB: [birth year] DOA: 03/20/2019 PCP: Lemmie Evens, MD   Brief Narrative:  68 y.o.malewith medical history significant forCOPD, pulmonary hypertension, heavy tobacco user, diastolic CHF and rheumatoid arthritis, [facility].Noted to be hypoxic in the ER requiring 6 L nasal cannula.  COVID-negative.  Chest x-ray showed large right-sided pneumothorax requiring chest tube placement.  Repeat x-rays showed improvement.  CT surgery were consulted.  Initial chest tube placed 8/14, subsequent resolution of pneumothorax therefore it was removed on 8/17.  Patient developed recurrent right-sided pneumothorax with tension therefore chest tube placed again on 8/18.   Assessment & Plan:   Active Problems:   Acute respiratory failure with hypoxia (HCC)   Spontaneous pneumothorax   Protein-calorie malnutrition, severe   Chest tube in place   Status post chest tube placement [facility])  Acute recurrent spontaneous pneumothorax with tension and hypoxia Acute on chronic hypoxia, 3 L nasal cannula.  Currently on 6 L nasal cannula. - Initial chest tube placed 8/14, removed 8/17.  Redeveloped large right-sided pneumothorax therefore repeat chest tube placed on 8/18. -Patient has end-stage emphysema, overall poor prognosis.  Will consult palliative care to establish long-term goals of care.  Patient has poor understanding of his advanced disease. Secondary to severe bullous emphysematous disease.  - Continue Bronchodilators.  Incentive spirometer.  Supplemental oxygen. -Repeat chest x-ray shows near complete resolution of recurrent right-sided pneumothorax.  Will benefit from outpatient pulmonary rehab  History of COPD with severe emphysema with active tobacco use - Cont Bronchodilators. Not wheezing.   Chronic diastolic congestive heart failure with preserved ejection fraction, 60% -Continue home medications.   No signs of obvious fluid overload.  Echo in February 2020 showed ejection fraction 60%.  Severe protein calorie malnutrition -Nutrition team consulted.  I fear patient overall has very poor prognosis especially with severe emphysema and recurrent pneumothorax.  We may need to establish long-term plan for chest tube.  I suspect even if chest tube was removed, his recurrence rate is really high.  DVT prophylaxis: SCDs Code Status: Full code Family Communication: None Disposition Plan: Patient is not ready to be discharged due to significant amount of hypoxia and recurrence of his pneumothorax.  Unsafe for discharge  Consultants:   CT surgery  Procedures:   Chest tube initial placement 8/14-removed 8/17  Chest tube placed again 8/18  Antimicrobials:   None   Subjective: Yesterday evening patient developed profound hypoxia and on the chest x-ray showed right-sided large pneumothorax with tension.  Emergent right-sided chest tube was placed.  This morning he is doing better but now on 6 L nasal cannula.  I had a prolonged discussion with him regarding his goals of care but it seems like he is having a hard time grasping the severity of his disease and long-term prognosis.  Discussed in consultation with palliative care service, he is agreeable.  Review of Systems Otherwise negative except as per HPI, including: General = no fevers, chills, dizziness, malaise, fatigue HEENT/EYES = negative for pain, redness, loss of vision, double vision, blurred vision, loss of hearing, sore throat, hoarseness, dysphagia Cardiovascular= negative for chest pain, palpitation, murmurs, lower extremity swelling Respiratory/lungs= negative for shortness of breath, cough, hemoptysis, wheezing, mucus production Gastrointestinal= negative for nausea, vomiting,, abdominal pain, melena, hematemesis Genitourinary= negative for Dysuria, Hematuria, Change in Urinary Frequency MSK = Negative for arthralgia,  myalgias, Back Pain, Joint swelling  Neurology= Negative for headache, seizures, numbness, tingling  Psychiatry=  Negative for anxiety, depression, suicidal and homocidal ideation Allergy/Immunology= Medication/Food allergy as listed  Skin= Negative for Rash, lesions, ulcers, itching   Objective: Vitals:   03/26/19 0634 03/26/19 0807 03/26/19 0841 03/26/19 0905  BP: 112/85   125/74  Pulse: 81   64  Resp: 18     Temp: 97.6 F (36.4 C)     TempSrc: Oral     SpO2: 97% 94% 92% 95%  Weight:      Height:        Intake/Output Summary (Last 24 hours) at 03/26/2019 1052 Last data filed at 03/26/2019 0900 Gross per 24 hour  Intake 120 ml  Output -  Net 120 ml   Filed Weights   03/20/19 2250 03/25/19 0424  Weight: 51.7 kg 50 kg    Examination: Constitutional: NAD, calm, comfortable, chronically ill and frail appearing.  6 L nasal cannula.  Bilateral temporal wasting. Eyes: PERRL, lids and conjunctivae normal ENMT: Mucous membranes are moist. Posterior pharynx clear of any exudate or lesions.Normal dentition.  Neck: normal, supple, no masses, no thyromegaly Respiratory: clear to auscultation bilaterally, no wheezing, no crackles. Normal respiratory effort. No accessory muscle use.  Has right-sided chest tube in place. Cardiovascular: Regular rate and rhythm, no murmurs / rubs / gallops. No extremity edema. 2+ pedal pulses. No carotid bruits.  Abdomen: no tenderness, no masses palpated. No hepatosplenomegaly. Bowel sounds positive.  Musculoskeletal: Significant loss of muscle mass. Skin: no rashes, lesions, ulcers. No induration Neurologic: CN 2-12 grossly intact. Sensation intact, DTR normal. Strength 4/5 in all 4.  Psychiatric: Normal judgment and insight. Alert and oriented x 3. Normal mood.    Data Reviewed:   CBC: Recent Labs  Lab 03/20/19 1458  03/22/19 0311 03/23/19 0332 03/24/19 0433 03/25/19 0309 03/26/19 0301  WBC 4.6   < > 8.0 6.4 6.0 6.7 7.2  NEUTROABS 3.0  --   6.3  --   --   --   --   HGB 16.1   < > 15.5 15.4 15.7 15.3 15.2  HCT 52.8*   < > 48.5 48.5 49.2 48.2 47.9  MCV 102.7*   < > 98.6 100.8* 99.4 100.2* 99.0  PLT 133*   < > 130* 124* 126* 124* 133*   < > = values in this interval not displayed.   Basic Metabolic Panel: Recent Labs  Lab 03/22/19 0311 03/23/19 0332 03/24/19 0433 03/25/19 0309 03/26/19 0301  NA 136 135 139 137 138  K 4.6 4.0 4.4 4.4 4.5  CL 97* 90* 95* 90* 88*  CO2 31 34* 36* 36* 40*  GLUCOSE 91 78 80 89 107*  BUN 28* 24* 21 24* 24*  CREATININE 0.74 0.84 0.70 0.88 0.70  CALCIUM 9.1 9.1 9.2 9.4 9.8  MG  --  1.6*  --  1.7 1.8   GFR: Estimated Creatinine Clearance: 64.2 mL/min (by C-G formula based on SCr of 0.7 mg/dL). Liver Function Tests: Recent Labs  Lab 03/25/19 0309 03/26/19 0301  AST 17 22  ALT 11 14  ALKPHOS 59 56  BILITOT 1.2 1.4*  PROT 6.1* 6.9  ALBUMIN 3.3* 3.6   No results for input(s): LIPASE, AMYLASE in the last 168 hours. No results for input(s): AMMONIA in the last 168 hours. Coagulation Profile: No results for input(s): INR, PROTIME in the last 168 hours. Cardiac Enzymes: No results for input(s): CKTOTAL, CKMB, CKMBINDEX, TROPONINI in the last 168 hours. BNP (last 3 results) No results for input(s): PROBNP in the last 8760 hours. HbA1C:  No results for input(s): HGBA1C in the last 72 hours. CBG: No results for input(s): GLUCAP in the last 168 hours. Lipid Profile: No results for input(s): CHOL, HDL, LDLCALC, TRIG, CHOLHDL, LDLDIRECT in the last 72 hours. Thyroid Function Tests: No results for input(s): TSH, T4TOTAL, FREET4, T3FREE, THYROIDAB in the last 72 hours. Anemia Panel: No results for input(s): VITAMINB12, FOLATE, FERRITIN, TIBC, IRON, RETICCTPCT in the last 72 hours. Sepsis Labs: No results for input(s): PROCALCITON, LATICACIDVEN in the last 168 hours.  Recent Results (from the past 240 hour(s))  SARS Coronavirus 2 [facility](Hospital order, [facility])  Nasopharyngeal Nasopharyngeal Swab     Status: None   Collection Time: 03/20/19  2:17 PM   Specimen: Nasopharyngeal Swab  Result Value Ref Range Status   SARS Coronavirus 2 NEGATIVE NEGATIVE Final    Comment: (NOTE) If result is NEGATIVE SARS-CoV-2 target nucleic acids are NOT DETECTED. The SARS-CoV-2 RNA is generally detectable in upper and lower  respiratory specimens during the acute phase of infection. The lowest  concentration of SARS-CoV-2 viral copies this assay can detect is 250  copies / mL. A negative result does not preclude SARS-CoV-2 infection  and should not be used as the sole basis for treatment or other  patient management decisions.  A negative result may occur with  improper specimen collection / handling, submission of specimen other  than nasopharyngeal swab, presence of viral mutation(s) within the  areas targeted by this assay, and inadequate number of viral copies  (<250 copies / mL). A negative result must be combined with clinical  observations, patient history, and epidemiological information. If result is POSITIVE SARS-CoV-2 target nucleic acids are DETECTED. The SARS-CoV-2 RNA is generally detectable in upper and lower  respiratory specimens dur ing the acute phase of infection.  Positive  results are indicative of active infection with SARS-CoV-2.  Clinical  correlation with patient history and other diagnostic information is  necessary to determine patient infection status.  Positive results do  not rule out bacterial infection or co-infection with other viruses. If result is PRESUMPTIVE POSTIVE SARS-CoV-2 nucleic acids MAY BE PRESENT.   A presumptive positive result was obtained on the submitted specimen  and confirmed on repeat testing.  While 2019 novel coronavirus  (SARS-CoV-2) nucleic acids may be present in the submitted sample  additional confirmatory testing may be necessary for epidemiological  and / or clinical management purposes  to  differentiate between  SARS-CoV-2 and other Sarbecovirus currently known to infect humans.  If clinically indicated additional testing with an alternate test  methodology [phone]([id number]) is advised. The SARS-CoV-2 RNA is generally  detectable in upper and lower respiratory sp ecimens during the acute  phase of infection. The expected result is Negative. Fact Sheet for Patients:  BoilerBrush.com.cyhttps://www.fda.gov/media/136312/download Fact Sheet for Healthcare Providers: https://pope.com/https://www.fda.gov/media/136313/download This test is not yet approved or cleared by the [place] FDA and has been authorized for detection and/or diagnosis of SARS-CoV-2 by FDA under an Emergency Use Authorization (EUA).  This EUA will remain in effect (meaning this test can be used) for the duration of the COVID-19 declaration under Section 564(b)(1) of the Act, 21 U.S.C. section 360bbb-3(b)(1), unless the authorization is terminated or revoked sooner. Performed at [facility], [address] [place], [place] [address]          Radiology Studies: Dg Chest 1 View  Result Date: 03/25/2019 CLINICAL DATA:  Shortness of breath.  Recent pneumothorax. EXAM: CHEST  1 VIEW COMPARISON:  Chest  x-rays dated 08/17 2020 and 03/20/2019 FINDINGS: The patient has recurrent large right pneumothorax at least 50%. There now appears to be slight shift of the heart mediastinal structures to the left suggesting tension. Extensive emphysematous changes noted on the left. Heart size and vascularity are normal. No acute bone abnormality. IMPRESSION: Recurrent large right pneumothorax with suggestion of tension. Critical Value/emergent results were called by telephone at the time of interpretation on 03/25/2019 at 6:01 pm to [facility], [place] , who verbally acknowledged these results. Electronically Signed   By: Francene Boyers M.D.   On: 03/25/2019 18:04   Dg Chest Port 1 View  Result Date: 03/25/2019 CLINICAL DATA:  Chest tube EXAM: PORTABLE CHEST 1 VIEW  COMPARISON:  Chest x-ray dated March 25, 2019 FINDINGS: There has been interval placement of a right-sided chest tube. There is a trace residual right-sided pneumothorax. No left-sided pneumothorax. The lungs are hyperexpanded. Coarsened lung markings are noted bilaterally consistent with scarring. Large bowl are noted bilaterally. The heart size is normal two slightly enlarged. Aortic calcifications are noted. The pulmonary arteries are significantly enlarged as seen on prior CT. IMPRESSION: 1. Status post placement of a right-sided chest tube. There is a trace residual right-sided pneumothorax. 2. Multiple additional chronic findings as detailed above, similar across prior studies. Electronically Signed   By: Katherine Mantle M.D.   On: 03/25/2019 19:55   Dg Chest Port 1 View  Result Date: 03/24/2019 CLINICAL DATA:  Status post right chest tube removal EXAM: PORTABLE CHEST 1 VIEW COMPARISON:  Film from earlier in the same day. FINDINGS: Previously seen pigtail catheter has been removed in the interval. No significant recurrent pneumothorax is noted. Emphysematous changes are noted in the apices bilaterally. No new focal infiltrate is seen. No bony abnormality is noted. IMPRESSION: No pneumothorax following pigtail removal. Electronically Signed   By: Alcide Clever M.D.   On: 03/24/2019 16:07        Scheduled Meds: . feeding supplement (ENSURE ENLIVE)  237 mL Oral TID BM  . fluticasone  1 spray Each Nare Daily  . furosemide  40 mg Oral Daily  . ipratropium-albuterol  3 mL Nebulization BID  . lidocaine  30 mL Intradermal Once  . multivitamin with minerals  1 tablet Oral Daily   Continuous Infusions:   LOS: 6 days   Time spent= 40 mins     Joline Maxcy, MD [facility]  If 7PM-7AM, please contact night-coverage [email or web address] 03/26/2019, 10:52 AM

## 2019-03-27 ENCOUNTER — Inpatient Hospital Stay (HOSPITAL_COMMUNITY): Payer: Medicare HMO

## 2019-03-27 DIAGNOSIS — R531 Weakness: Secondary | ICD-10-CM

## 2019-03-27 DIAGNOSIS — Z7189 Other specified counseling: Secondary | ICD-10-CM

## 2019-03-27 LAB — CBC
HCT: 47.7 % (ref 39.0–52.0)
Hemoglobin: 14.9 g/dL (ref 13.0–17.0)
MCH: 31.7 pg (ref 26.0–34.0)
MCHC: 31.2 g/dL (ref 30.0–36.0)
MCV: 101.5 fL — ABNORMAL HIGH (ref 80.0–100.0)
Platelets: 141 10*3/uL — ABNORMAL LOW (ref 150–400)
RBC: 4.7 MIL/uL (ref 4.22–5.81)
RDW: 11.9 % (ref 11.5–15.5)
WBC: 7.2 10*3/uL (ref 4.0–10.5)
nRBC: 0 % (ref 0.0–0.2)

## 2019-03-27 LAB — COMPREHENSIVE METABOLIC PANEL
ALT: 13 U/L (ref 0–44)
AST: 22 U/L (ref 15–41)
Albumin: 3.4 g/dL — ABNORMAL LOW (ref 3.5–5.0)
Alkaline Phosphatase: 64 U/L (ref 38–126)
Anion gap: 10 (ref 5–15)
BUN: 21 mg/dL (ref 8–23)
CO2: 40 mmol/L — ABNORMAL HIGH (ref 22–32)
Calcium: 9.6 mg/dL (ref 8.9–10.3)
Chloride: 87 mmol/L — ABNORMAL LOW (ref 98–111)
Creatinine, Ser: 0.73 mg/dL (ref 0.61–1.24)
GFR calc Af Amer: >60 mL/min (ref >60)
GFR calc non Af Amer: >60 mL/min (ref >60)
Glucose, Bld: 91 mg/dL (ref 70–99)
Potassium: 4.3 mmol/L (ref 3.5–5.1)
Sodium: 137 mmol/L (ref 135–145)
Total Bilirubin: 1.3 mg/dL — ABNORMAL HIGH (ref 0.3–1.2)
Total Protein: 6.7 g/dL (ref 6.5–8.1)

## 2019-03-27 LAB — MAGNESIUM: Magnesium: 1.7 mg/dL (ref 1.7–2.4)

## 2019-03-27 NOTE — Progress Notes (Signed)
PROGRESS NOTE    Derrick [name]  TKZ:[id number] DOB: May 04, 1951 DOA: 03/20/2019 PCP: Gareth Morgan, MD   Brief Narrative:  68 y.o.malewith medical history significant forCOPD, pulmonary hypertension, heavy tobacco user, diastolic CHF and rheumatoid arthritis, [facility] breathing.Noted to be hypoxic in the ER requiring 6 L nasal cannula.  COVID-negative.  Chest x-ray showed large right-sided pneumothorax requiring chest tube placement.  Repeat x-rays showed improvement.  CT surgery were consulted.  Initial chest tube placed 8/14, subsequent resolution of pneumothorax therefore it was removed on 8/17.  Patient developed recurrent right-sided pneumothorax with tension therefore chest tube placed again on 8/18.   Assessment & Plan:   Active Problems:   Acute respiratory failure with hypoxia (HCC)   Spontaneous pneumothorax   Protein-calorie malnutrition, severe   Chest tube in place   Status post chest tube placement [facility])   Palliative care by specialist   DNR (do not resuscitate) discussion   Weakness generalized  Acute recurrent spontaneous pneumothorax with tension and hypoxia Acute on chronic hypoxia, 3 L nasal cannula.  Currently on 6 L nasal cannula. - Initial chest tube placed 8/14, removed 8/17.  Redeveloped large right-sided pneumothorax therefore repeat chest tube placed on 8/18. Patient will likely need surgical approach for long term treatment- possible VATS- defer to CT surgery.  -Appreciate input from Palliaitive acare.  - Continue Bronchodilators.  Incentive spirometer.  Supplemental oxygen. -Repeat chest x-ray shows near complete resolution of recurrent right-sided pneumothorax.  Will benefit from outpatient pulmonary rehab  History of COPD with severe emphysema with active tobacco use - Cont Bronchodilators. Not wheezing.   Chronic diastolic congestive heart failure with preserved ejection fraction, 60% -Continue home  medications.  No signs of obvious fluid overload.  Echo in February 2020 showed ejection fraction 60%.  Severe protein calorie malnutrition -Nutrition team consulted.  Hi risk of recurrent PTX.  DVT prophylaxis: SCDs Code Status: Full code Family Communication: None Disposition Plan: Not stable for discharge,.  Consultants:   CT surgery  Procedures:   Chest tube initial placement 8/14-removed 8/17  Chest tube placed again 8/18  Antimicrobials:   None   Subjective: Feels ok this morning. Reports of some pain around his Chest insertion site.   Review of Systems Otherwise negative except as per HPI, including: General = no fevers, chills, dizziness, malaise, fatigue HEENT/EYES = negative for pain, redness, loss of vision, double vision, blurred vision, loss of hearing, sore throat, hoarseness, dysphagia Cardiovascular= negative for chest pain, palpitation, murmurs, lower extremity swelling Respiratory/lungs= negative for shortness of breath, cough, hemoptysis, wheezing, mucus production Gastrointestinal= negative for nausea, vomiting,, abdominal pain, melena, hematemesis Genitourinary= negative for Dysuria, Hematuria, Change in Urinary Frequency MSK = Negative for arthralgia, myalgias, Back Pain, Joint swelling  Neurology= Negative for headache, seizures, numbness, tingling  Psychiatry= Negative for anxiety, depression, suicidal and homocidal ideation Allergy/Immunology= Medication/Food allergy as listed  Skin= Negative for Rash, lesions, ulcers, itching   Objective: Vitals:   03/26/19 2234 03/27/19 0702 03/27/19 0827 03/27/19 1230  BP: 104/69 110/83  113/69  Pulse: 60 71  88  Resp: 12   16  Temp: 98.3 F (36.8 C) 98.2 F (36.8 C)  97.6 F (36.4 C)  TempSrc: Oral Oral  Oral  SpO2: 96% 93% 91% 93%  Weight:      Height:        Intake/Output Summary (Last 24 hours) at 03/27/2019 1352 Last data filed at 03/27/2019 0700 Gross per 24 hour  Intake -  Output 700 ml   Net -700 ml   Filed Weights   03/20/19 2250 03/25/19 0424  Weight: 51.7 kg 50 kg    Examination: Constitutional: NAD, calm, comfortable; appears chronically ILL on 6 L [place], b/l temporal wasting. Frail.  Eyes: PERRL, lids and conjunctivae normal ENMT: Mucous membranes are moist. Posterior pharynx clear of any exudate or lesions.Normal dentition.  Neck: normal, supple, no masses, no thyromegaly Respiratory: clear to auscultation bilaterally, no wheezing, no crackles. Normal respiratory effort. No accessory muscle use.  Cardiovascular: Regular rate and rhythm, no murmurs / rubs / gallops. No extremity edema. 2+ pedal pulses. No carotid bruits.  Abdomen: no tenderness, no masses palpated. No hepatosplenomegaly. Bowel sounds positive.  Musculoskeletal: no clubbing / cyanosis. No joint deformity upper and lower extremities. Good ROM, no contractures. Normal muscle tone.  Skin: no rashes, lesions, ulcers. No induration Neurologic: CN 2-12 grossly intact. Sensation intact, DTR normal. Strength 4/5 in all 4.  Psychiatric: Normal judgment and insight. Alert and oriented x 3. Normal mood.  Right sided chest tube noted.   Data Reviewed:   CBC: Recent Labs  Lab 03/20/19 1458  03/22/19 0311 03/23/19 0332 03/24/19 0433 03/25/19 0309 03/26/19 0301 03/27/19 0326  WBC 4.6   < > 8.0 6.4 6.0 6.7 7.2 7.2  NEUTROABS 3.0  --  6.3  --   --   --   --   --   HGB 16.1   < > 15.5 15.4 15.7 15.3 15.2 14.9  HCT 52.8*   < > 48.5 48.5 49.2 48.2 47.9 47.7  MCV 102.7*   < > 98.6 100.8* 99.4 100.2* 99.0 101.5*  PLT 133*   < > 130* 124* 126* 124* 133* 141*   < > = values in this interval not displayed.   Basic Metabolic Panel: Recent Labs  Lab 03/23/19 0332 03/24/19 0433 03/25/19 0309 03/26/19 0301 03/27/19 0326  NA 135 139 137 138 137  K 4.0 4.4 4.4 4.5 4.3  CL 90* 95* 90* 88* 87*  CO2 34* 36* 36* 40* 40*  GLUCOSE 78 80 89 107* 91  BUN 24* 21 24* 24* 21  CREATININE 0.84 0.70 0.88 0.70 0.73   CALCIUM 9.1 9.2 9.4 9.8 9.6  MG 1.6*  --  1.7 1.8 1.7   GFR: Estimated Creatinine Clearance: 64.2 mL/min (by C-G formula based on SCr of 0.73 mg/dL). Liver Function Tests: Recent Labs  Lab 03/25/19 0309 03/26/19 0301 03/27/19 0326  AST 17 22 22   ALT 11 14 13   ALKPHOS 59 56 64  BILITOT 1.2 1.4* 1.3*  PROT 6.1* 6.9 6.7  ALBUMIN 3.3* 3.6 3.4*   No results for input(s): LIPASE, AMYLASE in the last 168 hours. No results for input(s): AMMONIA in the last 168 hours. Coagulation Profile: No results for input(s): INR, PROTIME in the last 168 hours. Cardiac Enzymes: No results for input(s): CKTOTAL, CKMB, CKMBINDEX, TROPONINI in the last 168 hours. BNP (last 3 results) No results for input(s): PROBNP in the last 8760 hours. HbA1C: No results for input(s): HGBA1C in the last 72 hours. CBG: No results for input(s): GLUCAP in the last 168 hours. Lipid Profile: No results for input(s): CHOL, HDL, LDLCALC, TRIG, CHOLHDL, LDLDIRECT in the last 72 hours. Thyroid Function Tests: No results for input(s): TSH, T4TOTAL, FREET4, T3FREE, THYROIDAB in the last 72 hours. Anemia Panel: No results for input(s): VITAMINB12, FOLATE, FERRITIN, TIBC, IRON, RETICCTPCT in the last 72 hours. Sepsis Labs: No results for input(s): PROCALCITON, LATICACIDVEN in the last  168 hours.  Recent Results (from the past 240 hour(s))  SARS Coronavirus 2 [facility] order, Performed in [facility] hospital lab) Nasopharyngeal Nasopharyngeal Swab     Status: None   Collection Time: 03/20/19  2:17 PM   Specimen: Nasopharyngeal Swab  Result Value Ref Range Status   SARS Coronavirus 2 NEGATIVE NEGATIVE Final    Comment: (NOTE) If result is NEGATIVE SARS-CoV-2 target nucleic acids are NOT DETECTED. The SARS-CoV-2 RNA is generally detectable in upper and lower  respiratory specimens during the acute phase of infection. The lowest  concentration of SARS-CoV-2 viral copies this assay can detect is 250  copies / mL. A  negative result does not preclude SARS-CoV-2 infection  and should not be used as the sole basis for treatment or other  patient management decisions.  A negative result may occur with  improper specimen collection / handling, submission of specimen other  than nasopharyngeal swab, presence of viral mutation(s) within the  areas targeted by this assay, and inadequate number of viral copies  (<250 copies / mL). A negative result must be combined with clinical  observations, patient history, and epidemiological information. If result is POSITIVE SARS-CoV-2 target nucleic acids are DETECTED. The SARS-CoV-2 RNA is generally detectable in upper and lower  respiratory specimens dur ing the acute phase of infection.  Positive  results are indicative of active infection with SARS-CoV-2.  Clinical  correlation with patient history and other diagnostic information is  necessary to determine patient infection status.  Positive results do  not rule out bacterial infection or co-infection with other viruses. If result is PRESUMPTIVE POSTIVE SARS-CoV-2 nucleic acids MAY BE PRESENT.   A presumptive positive result was obtained on the submitted specimen  and confirmed on repeat testing.  While 2019 novel coronavirus  (SARS-CoV-2) nucleic acids may be present in the submitted sample  additional confirmatory testing may be necessary for epidemiological  and / or clinical management purposes  to differentiate between  SARS-CoV-2 and other Sarbecovirus currently known to infect humans.  If clinically indicated additional testing with an alternate test  methodology [phone]) is advised. The SARS-CoV-2 RNA is generally  detectable in upper and lower respiratory sp ecimens during the acute  phase of infection. The expected result is Negative. Fact Sheet for Patients:  StrictlyIdeas.no Fact Sheet for Healthcare Providers: BankingDealers.co.za This test is not  yet approved or cleared by the [place] FDA and has been authorized for detection and/or diagnosis of SARS-CoV-2 by FDA under an Emergency Use Authorization (EUA).  This EUA will remain in effect (meaning this test can be used) for the duration of the COVID-19 declaration under Section 564(b)(1) of the Act, 21 U.S.C. section 360bbb-3(b)(1), unless the authorization is terminated or revoked sooner. Performed at [facility], [address] [place], [place] [address]          Radiology Studies: Dg Chest 1 View  Result Date: 03/25/2019 CLINICAL DATA:  Shortness of breath.  Recent pneumothorax. EXAM: CHEST  1 VIEW COMPARISON:  Chest x-rays dated 08/17 2020 and 03/20/2019 FINDINGS: The patient has recurrent large right pneumothorax at least 50%. There now appears to be slight shift of the heart mediastinal structures to the left suggesting tension. Extensive emphysematous changes noted on the left. Heart size and vascularity are normal. No acute bone abnormality. IMPRESSION: Recurrent large right pneumothorax with suggestion of tension. Critical Value/emergent results were called by telephone at the time of interpretation on 03/25/2019 at 6:01 pm to [facility], [place] , who verbally  acknowledged these results. Electronically Signed   By: Francene Boyers M.D.   On: 03/25/2019 18:04   Dg Chest Port 1 View  Result Date: 03/27/2019 CLINICAL DATA:  Chest tube EXAM: PORTABLE CHEST 1 VIEW COMPARISON:  03/25/2019 FINDINGS: Right chest tube remains in place. Small residual right pneumothorax is unchanged. There is hyperinflation of the lungs compatible with COPD. Heart is borderline in size. No confluent opacities or effusions. IMPRESSION: Stable small right apical pneumothorax with right chest tube in place. COPD Electronically Signed   By: Charlett Nose M.D.   On: 03/27/2019 10:53   Dg Chest Port 1 View  Result Date: 03/25/2019 CLINICAL DATA:  Chest tube EXAM: PORTABLE CHEST 1 VIEW COMPARISON:  Chest x-ray  dated March 25, 2019 FINDINGS: There has been interval placement of a right-sided chest tube. There is a trace residual right-sided pneumothorax. No left-sided pneumothorax. The lungs are hyperexpanded. Coarsened lung markings are noted bilaterally consistent with scarring. Large bowl are noted bilaterally. The heart size is normal two slightly enlarged. Aortic calcifications are noted. The pulmonary arteries are significantly enlarged as seen on prior CT. IMPRESSION: 1. Status post placement of a right-sided chest tube. There is a trace residual right-sided pneumothorax. 2. Multiple additional chronic findings as detailed above, similar across prior studies. Electronically Signed   By: Katherine Mantle M.D.   On: 03/25/2019 19:55        Scheduled Meds: . feeding supplement (ENSURE ENLIVE)  237 mL Oral TID BM  . fluticasone  1 spray Each Nare Daily  . furosemide  40 mg Oral Daily  . lidocaine  30 mL Intradermal Once  . multivitamin with minerals  1 tablet Oral Daily   Continuous Infusions:   LOS: 7 days   Time spent= 35 mins    Amiaya Mcneeley Joline Maxcy, MD [facility]  If 7PM-7AM, please contact night-coverage [email or web address] 03/27/2019, 1:52 PM

## 2019-03-27 NOTE — Progress Notes (Signed)
Physical Therapy Treatment Patient Details Name: Derrick [name] MRN: [id number] DOB: 1950/08/08 Today's Date: 03/27/2019    History of Present Illness 68 y.o. male with medical history significant for COPD, pulmonary hypertension, heavy tobacco user, diastolic CHF and rheumatoid arthritis, presented to the [facility] with sudden onset difficulty breathing.  Noted to be hypoxic in the ER requiring 6 L nasal cannula.  COVID-negative.  Chest x-ray showed large right-sided pneumothorax requiring chest tube placement. Chest tube dc'd evening of 8/17, re-inserted 03/25/19.    PT Comments    Pt ambulated 120' with RW and 6L O2, SaO2 86%, HR 115, 2/4 dyspnea.  Instructed pt in seated LE strengthening exercises.  Pt is progressing well with mobility.   Follow Up Recommendations  Other (comment)(HHRN for chronic disease management; Outpt Pulmonary Rehab)     Equipment Recommendations  None recommended by PT    Recommendations for Other Services       Precautions / Restrictions Precautions Precautions: Other (comment) Precaution Comments: Watch O2 Restrictions Weight Bearing Restrictions: No    Mobility  Bed Mobility Overal bed mobility: Independent                Transfers Overall transfer level: Independent Equipment used: None Transfers: Sit to/from Stand Sit to Stand: Independent         General transfer comment: supervision for safety  Ambulation/Gait Ambulation/Gait assistance: Supervision Gait Distance (Feet): 120 Feet Assistive device: Rolling walker (2 wheeled) Gait Pattern/deviations: Step-through pattern;Decreased stride length Gait velocity: decr   General Gait Details: steady with RW, ambulated with 6L O2, SaO2 86%, distance limited by fatigue, 2/4 dyspnea, HR 115   Stairs             Wheelchair Mobility    Modified Rankin (Stroke Patients Only)       Balance Overall balance assessment: Mild deficits observed, not formally tested                                           Cognition Arousal/Alertness: Awake/alert Behavior During Therapy: WFL for tasks assessed/performed Overall Cognitive Status: Within Functional Limits for tasks assessed                                        Exercises General Exercises - Lower Extremity Ankle Circles/Pumps: AROM;Both;10 reps;Seated Long Arc Quad: AROM;Both;10 reps;Seated Hip Flexion/Marching: AROM;Both;10 reps;Seated    General Comments        Pertinent Vitals/Pain Pain Assessment: 0-10 Pain Score: 6  Pain Location: chest tube insertion site Pain Descriptors / Indicators: Sore Pain Intervention(s): Limited activity within patient's tolerance;Monitored during session;Premedicated before session    Home Living                      Prior Function            PT Goals (current goals can now be found in the care plan section) Acute Rehab PT Goals Patient Stated Goal: Hopes to be home soon PT Goal Formulation: With patient Time For Goal Achievement: 04/08/19 Potential to Achieve Goals: Good Progress towards PT goals: Progressing toward goals    Frequency    Min 3X/week      PT Plan Current plan remains appropriate    Co-evaluation  AM-PAC PT "6 Clicks" Mobility   Outcome Measure  Help needed turning from your back to your side while in a flat bed without using bedrails?: None Help needed moving from lying on your back to sitting on the side of a flat bed without using bedrails?: None Help needed moving to and from a bed to a chair (including a wheelchair)?: None Help needed standing up from a chair using your arms (e.g., wheelchair or bedside chair)?: None Help needed to walk in hospital room?: A Little Help needed climbing 3-5 steps with a railing? : A Little 6 Click Score: 22    End of Session Equipment Utilized During Treatment: Oxygen;Gait belt Activity Tolerance: Patient tolerated treatment  well Patient left: in bed;with call bell/phone within reach;with family/visitor present;with SCD's reapplied(MD in room) Nurse Communication: Mobility status PT Visit Diagnosis: Unsteadiness on feet (R26.81);Other abnormalities of gait and mobility (R26.89)     Time: 1315-1340 PT Time Calculation (min) (ACUTE ONLY): 25 min  Charges:  $Gait Training: 8-22 mins $Therapeutic Exercise: 8-22 mins           Blondell Reveal Kistler PT 03/27/2019  Acute Rehabilitation Services Pager [phone] Office [phone]

## 2019-03-27 NOTE — Progress Notes (Addendum)
      [address]       [place] [address]             [phone]         Subjective: Chest tube site pain is better this morning. No other complaints. Using his IS this morning.   Objective: Vital signs in last 24 hours: Temp:  [98 F (36.7 C)-98.3 F (36.8 C)] 98.2 F (36.8 C) (08/20 0702) Pulse Rate:  [60-71] 71 (08/20 0702) Cardiac Rhythm: Sinus bradycardia (08/19 1900) Resp:  [12-17] 12 (08/19 2234) BP: (104-125)/(69-83) 110/83 (08/20 0702) SpO2:  [91 %-96 %] 93 % (08/20 0702)     Intake/Output from previous day: 08/19 0701 - 08/20 0700 In: 120 [P.O.:120] Out: 1050 [Urine:1050] Intake/Output this shift: No intake/output data recorded.  General appearance: alert, cooperative and no distress Heart: RRR, no murmur Lungs: clear to auscultation bilaterally Abdomen: soft, non-tender; bowel sounds normal; no masses,  no organomegaly Extremities: extremities normal, atraumatic, no cyanosis or edema Wound: no leaking around the chest tube  Lab Results: Recent Labs    03/26/19 0301 03/27/19 0326  WBC 7.2 7.2  HGB 15.2 14.9  HCT 47.9 47.7  PLT 133* 141*   BMET:  Recent Labs    03/26/19 0301 03/27/19 0326  NA 138 137  K 4.5 4.3  CL 88* 87*  CO2 40* 40*  GLUCOSE 107* 91  BUN 24* 21  CREATININE 0.70 0.73  CALCIUM 9.8 9.6    PT/INR: No results for input(s): LABPROT, INR in the last 72 hours. ABG    Component Value Date/Time   PHART 7.369 03/25/2019 1753   HCO3 40.5 (H) 03/25/2019 1753   O2SAT 93.5 03/25/2019 1753   CBG (last 3)  No results for input(s): GLUCAP in the last 72 hours.  Assessment/Plan:  This patient is a 68 year old male with a past medical history of COPD and tobacco dependence. He was admitted for spontaneous pneumothorax and a pigtail catheter was placed by Dr. Kipp Brood on 8/14. There was complete resolution of the pneumothorax and the chest tube was subsequently removed on 8/17. On 8/18 the patient had a  recurrent right spontaneous pneumothorax and required a non-rebreather. A larger bore chest tube was placed by Dr. Cyndia Bent. Follow-up CXR showed almost complete resolution with a trace residual right-sided pneumothorax.    1. Chest tube-currently on 20cm of suction with no air leak and no drainage in the atrium. CXR stable this morning so will change to water seal. 2. Pulm-Severe COPD with a history of smoking. Patient is tolerating 6L [place]. Encouraged incentive spirometer hourly.  3. DVT prophylaxis-SCDs in place 4. CV- NSR in the 60s. BP well controlled 5. Continue on home dose of Lasix-40mg  daily 6. Continue Oxycodone PRN for pain at the chest tube site.  Plan: Change to water seal. CXR in the am. Continue to encourage incentive spirometer. Was OOB to chair yesterday but sounds like no ambulation. Ambulate if able today.    LOS: 7 days    [name] 03/27/2019   Chart reviewed, patient examined, agree with above. CXR looks good without ptx. No air leak so put to water seal. Will reevaluate with CXR in am and consider chest tube removal if no change.

## 2019-03-28 ENCOUNTER — Inpatient Hospital Stay (HOSPITAL_COMMUNITY): Payer: Medicare HMO

## 2019-03-28 DIAGNOSIS — Z515 Encounter for palliative care: Secondary | ICD-10-CM

## 2019-03-28 LAB — BASIC METABOLIC PANEL
Anion gap: 10 (ref 5–15)
BUN: 21 mg/dL (ref 8–23)
CO2: 40 mmol/L — ABNORMAL HIGH (ref 22–32)
Calcium: 9 mg/dL (ref 8.9–10.3)
Chloride: 84 mmol/L — ABNORMAL LOW (ref 98–111)
Creatinine, Ser: 0.69 mg/dL (ref 0.61–1.24)
GFR calc Af Amer: >60 mL/min (ref >60)
GFR calc non Af Amer: >60 mL/min (ref >60)
Glucose, Bld: 101 mg/dL — ABNORMAL HIGH (ref 70–99)
Potassium: 4.1 mmol/L (ref 3.5–5.1)
Sodium: 134 mmol/L — ABNORMAL LOW (ref 135–145)

## 2019-03-28 LAB — MAGNESIUM: Magnesium: 1.6 mg/dL — ABNORMAL LOW (ref 1.7–2.4)

## 2019-03-28 LAB — CBC
HCT: 45.1 % (ref 39.0–52.0)
Hemoglobin: 14.5 g/dL (ref 13.0–17.0)
MCH: 31.9 pg (ref 26.0–34.0)
MCHC: 32.2 g/dL (ref 30.0–36.0)
MCV: 99.3 fL (ref 80.0–100.0)
Platelets: 128 10*3/uL — ABNORMAL LOW (ref 150–400)
RBC: 4.54 MIL/uL (ref 4.22–5.81)
RDW: 12.1 % (ref 11.5–15.5)
WBC: 6.5 10*3/uL (ref 4.0–10.5)
nRBC: 0 % (ref 0.0–0.2)

## 2019-03-28 NOTE — Progress Notes (Addendum)
CT removed per order and per protocol. Pt tolerated well.  Clyde Canterbury, RN

## 2019-03-28 NOTE — Progress Notes (Signed)
PROGRESS NOTE    Derrick [name]  ZOX:[id number] DOB: 07/21/1951 DOA: 03/20/2019 PCP: Gareth MorganKnowlton, Steve, MD   Brief Narrative:  68 y.o.malewith medical history significant forCOPD, pulmonary hypertension, heavy tobacco user, diastolic CHF and rheumatoid arthritis, [facility].Noted to be hypoxic in the ER requiring 6 L nasal cannula.  COVID-negative.  Chest x-ray showed large right-sided pneumothorax requiring chest tube placement.  Repeat x-rays showed improvement.  CT surgery were consulted.  Initial chest tube placed 8/14, subsequent resolution of pneumothorax therefore it was removed on 8/17.  Patient developed recurrent right-sided pneumothorax with tension therefore chest tube placed again on 8/18.   Assessment & Plan:   Active Problems:   Acute respiratory failure with hypoxia (HCC)   Spontaneous pneumothorax   Protein-calorie malnutrition, severe   Chest tube in place   Status post chest tube placement [facility](HCC)   Palliative care by specialist   DNR (do not resuscitate) discussion   Weakness generalized  Acute recurrent spontaneous pneumothorax with tension and hypoxia Acute on chronic hypoxia, 3 L nasal cannula.  Currently on 6 L nasal cannula. - Initial chest tube placed 8/14, removed 8/17. - Recurrent large pneumothorax with tension 8/18, chest tube replaced 8/18. -Plan to remove chest tube again today, if he redevelops pneumothorax-he will require surgical intervention. -CT surgery following -Appreciate input from Palliaitive acare.  - Continue Bronchodilators.  Incentive spirometer.  Supplemental oxygen.  History of COPD with severe emphysema with active tobacco use - Cont Bronchodilators. Not wheezing.   Chronic diastolic congestive heart failure with preserved ejection fraction, 60% -Continue home medications.  No signs of obvious fluid overload.  Echo in February 2020 showed ejection fraction 60%.  Severe protein  calorie malnutrition -Nutrition team consulted.  At high risk for recurrent pneumothorax  DVT prophylaxis: SCDs Code Status: Full code Family Communication: None Disposition Plan: Maintain inpatient stay, unstable for discharge given he is at a very high risk for recurrent pneumothorax. Consultants:   CT surgery  Procedures:   Chest tube initial placement 8/14-removed 8/17  Chest tube placed again 8/18  Antimicrobials:   None   Subjective: Feels okay as long as he is resting but does get dyspneic with minimal exertion.  Review of Systems Otherwise negative except as per HPI, including: General = no fevers, chills, dizziness, malaise, fatigue HEENT/EYES = negative for pain, redness, loss of vision, double vision, blurred vision, loss of hearing, sore throat, hoarseness, dysphagia Cardiovascular= negative for chest pain, palpitation, murmurs, lower extremity swelling Respiratory/lungs= negative for  cough, hemoptysis, wheezing, mucus production Gastrointestinal= negative for nausea, vomiting,, abdominal pain, melena, hematemesis Genitourinary= negative for Dysuria, Hematuria, Change in Urinary Frequency MSK = Negative for arthralgia, myalgias, Back Pain, Joint swelling  Neurology= Negative for headache, seizures, numbness, tingling  Psychiatry= Negative for anxiety, depression, suicidal and homocidal ideation Allergy/Immunology= Medication/Food allergy as listed  Skin= Negative for Rash, lesions, ulcers, itching  Objective: Vitals:   03/27/19 0827 03/27/19 1230 03/27/19 2157 03/28/19 0640  BP:  113/69 117/73 110/74  Pulse:  88 80 80  Resp:  16 18 18   Temp:  97.6 F (36.4 C) 98.8 F (37.1 C) 98.1 F (36.7 C)  TempSrc:  Oral    SpO2: 91% 93% 95% 97%  Weight:      Height:        Intake/Output Summary (Last 24 hours) at 03/28/2019 1051 Last data filed at 03/28/2019 0750 Gross per 24 hour  Intake -  Output 650 ml  Net -650  ml   Filed Weights   03/20/19 2250  03/25/19 0424  Weight: 51.7 kg 50 kg    Examination: Constitutional: NAD, calm, comfortable, on 6 L nasal cannula, frail-appearing. Eyes: PERRL, lids and conjunctivae normal ENMT: Mucous membranes are moist. Posterior pharynx clear of any exudate or lesions.Normal dentition.  Neck: normal, supple, no masses, no thyromegaly Respiratory: Bibasilar rhonchi.  Cardiovascular: Regular rate and rhythm, no murmurs / rubs / gallops. No extremity edema. 2+ pedal pulses. No carotid bruits.  Abdomen: no tenderness, no masses palpated. No hepatosplenomegaly. Bowel sounds positive.  Musculoskeletal: no clubbing / cyanosis. No joint deformity upper and lower extremities. Good ROM, no contractures. Normal muscle tone.  Skin: no rashes, lesions, ulcers. No induration Neurologic: CN 2-12 grossly intact. Sensation intact, DTR normal. Strength 4/5 in all 4.  Psychiatric: Normal judgment and insight. Alert and oriented x 3. Normal mood.   Right sided chest tube noted.   Data Reviewed:   CBC: Recent Labs  Lab 03/22/19 0311  03/24/19 0433 03/25/19 0309 03/26/19 0301 03/27/19 0326 03/28/19 0215  WBC 8.0   < > 6.0 6.7 7.2 7.2 6.5  NEUTROABS 6.3  --   --   --   --   --   --   HGB 15.5   < > 15.7 15.3 15.2 14.9 14.5  HCT 48.5   < > 49.2 48.2 47.9 47.7 45.1  MCV 98.6   < > 99.4 100.2* 99.0 101.5* 99.3  PLT 130*   < > 126* 124* 133* 141* 128*   < > = values in this interval not displayed.   Basic Metabolic Panel: Recent Labs  Lab 03/23/19 0332 03/24/19 0433 03/25/19 0309 03/26/19 0301 03/27/19 0326 03/28/19 0215  NA 135 139 137 138 137 134*  K 4.0 4.4 4.4 4.5 4.3 4.1  CL 90* 95* 90* 88* 87* 84*  CO2 34* 36* 36* 40* 40* 40*  GLUCOSE 78 80 89 107* 91 101*  BUN 24* 21 24* 24* 21 21  CREATININE 0.84 0.70 0.88 0.70 0.73 0.69  CALCIUM 9.1 9.2 9.4 9.8 9.6 9.0  MG 1.6*  --  1.7 1.8 1.7 1.6*   GFR: Estimated Creatinine Clearance: 64.2 mL/min (by C-G formula based on SCr of 0.69 mg/dL). Liver  Function Tests: Recent Labs  Lab 03/25/19 0309 03/26/19 0301 03/27/19 0326  AST 17 22 22   ALT 11 14 13   ALKPHOS 59 56 64  BILITOT 1.2 1.4* 1.3*  PROT 6.1* 6.9 6.7  ALBUMIN 3.3* 3.6 3.4*   No results for input(s): LIPASE, AMYLASE in the last 168 hours. No results for input(s): AMMONIA in the last 168 hours. Coagulation Profile: No results for input(s): INR, PROTIME in the last 168 hours. Cardiac Enzymes: No results for input(s): CKTOTAL, CKMB, CKMBINDEX, TROPONINI in the last 168 hours. BNP (last 3 results) No results for input(s): PROBNP in the last 8760 hours. HbA1C: No results for input(s): HGBA1C in the last 72 hours. CBG: No results for input(s): GLUCAP in the last 168 hours. Lipid Profile: No results for input(s): CHOL, HDL, LDLCALC, TRIG, CHOLHDL, LDLDIRECT in the last 72 hours. Thyroid Function Tests: No results for input(s): TSH, T4TOTAL, FREET4, T3FREE, THYROIDAB in the last 72 hours. Anemia Panel: No results for input(s): VITAMINB12, FOLATE, FERRITIN, TIBC, IRON, RETICCTPCT in the last 72 hours. Sepsis Labs: No results for input(s): PROCALCITON, LATICACIDVEN in the last 168 hours.  Recent Results (from the past 240 hour(s))  SARS Coronavirus 2 [facility] order, Performed in [facility] hospital  lab) Nasopharyngeal Nasopharyngeal Swab     Status: None   Collection Time: 03/20/19  2:17 PM   Specimen: Nasopharyngeal Swab  Result Value Ref Range Status   SARS Coronavirus 2 NEGATIVE NEGATIVE Final    Comment: (NOTE) If result is NEGATIVE SARS-CoV-2 target nucleic acids are NOT DETECTED. The SARS-CoV-2 RNA is generally detectable in upper and lower  respiratory specimens during the acute phase of infection. The lowest  concentration of SARS-CoV-2 viral copies this assay can detect is 250  copies / mL. A negative result does not preclude SARS-CoV-2 infection  and should not be used as the sole basis for treatment or other  patient management decisions.  A negative  result may occur with  improper specimen collection / handling, submission of specimen other  than nasopharyngeal swab, presence of viral mutation(s) within the  areas targeted by this assay, and inadequate number of viral copies  (<250 copies / mL). A negative result must be combined with clinical  observations, patient history, and epidemiological information. If result is POSITIVE SARS-CoV-2 target nucleic acids are DETECTED. The SARS-CoV-2 RNA is generally detectable in upper and lower  respiratory specimens dur ing the acute phase of infection.  Positive  results are indicative of active infection with SARS-CoV-2.  Clinical  correlation with patient history and other diagnostic information is  necessary to determine patient infection status.  Positive results do  not rule out bacterial infection or co-infection with other viruses. If result is PRESUMPTIVE POSTIVE SARS-CoV-2 nucleic acids MAY BE PRESENT.   A presumptive positive result was obtained on the submitted specimen  and confirmed on repeat testing.  While 2019 novel coronavirus  (SARS-CoV-2) nucleic acids may be present in the submitted sample  additional confirmatory testing may be necessary for epidemiological  and / or clinical management purposes  to differentiate between  SARS-CoV-2 and other Sarbecovirus currently known to infect humans.  If clinically indicated additional testing with an alternate test  methodology [phone]) is advised. The SARS-CoV-2 RNA is generally  detectable in upper and lower respiratory sp ecimens during the acute  phase of infection. The expected result is Negative. Fact Sheet for Patients:  StrictlyIdeas.no Fact Sheet for Healthcare Providers: BankingDealers.co.za This test is not yet approved or cleared by the [place] FDA and has been authorized for detection and/or diagnosis of SARS-CoV-2 by FDA under an Emergency Use Authorization  (EUA).  This EUA will remain in effect (meaning this test can be used) for the duration of the COVID-19 declaration under Section 564(b)(1) of the Act, 21 U.S.C. section 360bbb-3(b)(1), unless the authorization is terminated or revoked sooner. Performed at [facility], [address], [place], [place] [address]          Radiology Studies: Dg Chest [facility] 1 View  Result Date: 03/28/2019 CLINICAL DATA:  Chest tube, pneumothorax EXAM: PORTABLE CHEST 1 VIEW COMPARISON:  03/27/2019 FINDINGS: Right chest tube remains in place, unchanged. Small right apical pneumothorax again noted, stable. There is hyperinflation of the lungs compatible with COPD. Heart is normal size. No effusions. IMPRESSION: Stable small right apical pneumothorax with right chest tube in stable position. COPD. Electronically Signed   By: Rolm Baptise M.D.   On: 03/28/2019 08:13   Dg Chest Port 1 View  Result Date: 03/27/2019 CLINICAL DATA:  Chest tube EXAM: PORTABLE CHEST 1 VIEW COMPARISON:  03/25/2019 FINDINGS: Right chest tube remains in place. Small residual right pneumothorax is unchanged. There is hyperinflation of the lungs compatible with COPD. Heart is  borderline in size. No confluent opacities or effusions. IMPRESSION: Stable small right apical pneumothorax with right chest tube in place. COPD Electronically Signed   By: Charlett Nose M.D.   On: 03/27/2019 10:53        Scheduled Meds: . feeding supplement (ENSURE ENLIVE)  237 mL Oral TID BM  . fluticasone  1 spray Each Nare Daily  . furosemide  40 mg Oral Daily  . lidocaine  30 mL Intradermal Once  . multivitamin with minerals  1 tablet Oral Daily   Continuous Infusions:   LOS: 8 days   Time spent= 35 mins    Uilani Sanville Joline Maxcy, MD [facility]  If 7PM-7AM, please contact night-coverage [email or web address] 03/28/2019, 10:51 AM

## 2019-03-28 NOTE — Progress Notes (Signed)
Physical Therapy Treatment Patient Details Name: Derrick [name] MRN: [id number] DOB: 1951-08-05 Today's Date: 03/28/2019    History of Present Illness 68 y.o. male with medical history significant for COPD, pulmonary hypertension, heavy tobacco user, diastolic CHF and rheumatoid arthritis, presented to the [facility] with sudden onset difficulty breathing.  Noted to be hypoxic in the ER requiring 6 L nasal cannula.  COVID-negative.  Chest x-ray showed large right-sided pneumothorax requiring chest tube placement. Chest tube dc'd evening of 8/17, re-inserted 03/25/19 and removed again on 03/28/19    PT Comments    Pt's chest tube was removed this AM. He is progressing well towards goals and able to ambulate with supervision for safety. Can likely begin progression off RW next session.  Follow Up Recommendations  Other (comment)(HHRN for chronic disease management; Outpt Pulmonary Rehab)     Equipment Recommendations  None recommended by PT    Recommendations for Other Services       Precautions / Restrictions Precautions Precautions: Other (comment) Precaution Comments: Watch O2 Restrictions Weight Bearing Restrictions: No    Mobility  Bed Mobility Overal bed mobility: Independent                Transfers Overall transfer level: Independent Equipment used: None   Sit to Stand: Independent            Ambulation/Gait Ambulation/Gait assistance: Supervision Gait Distance (Feet): 150 Feet Assistive device: Rolling walker (2 wheeled) Gait Pattern/deviations: Step-through pattern;Decreased stride length Gait velocity: decr   General Gait Details: Pt steady with RW, ambulating on 6L O2. SpO2 remained above 90% during session. He fatigues quickly, but no report of SOB.   Stairs             Wheelchair Mobility    Modified Rankin (Stroke Patients Only)       Balance Overall balance assessment: Mild deficits observed, not formally tested                                           Cognition Arousal/Alertness: Awake/alert Behavior During Therapy: WFL for tasks assessed/performed Overall Cognitive Status: Within Functional Limits for tasks assessed                                        Exercises      General Comments        Pertinent Vitals/Pain Pain Assessment: No/denies pain Pain Intervention(s): Monitored during session;Limited activity within patient's tolerance    Home Living                      Prior Function            PT Goals (current goals can now be found in the care plan section) Acute Rehab PT Goals Patient Stated Goal: Hopes to be home soon PT Goal Formulation: With patient Time For Goal Achievement: 04/08/19 Potential to Achieve Goals: Good Progress towards PT goals: Progressing toward goals    Frequency    Min 3X/week      PT Plan Current plan remains appropriate    Co-evaluation              AM-PAC PT "6 Clicks" Mobility   Outcome Measure  Help needed turning from your back to your side while in a flat  bed without using bedrails?: None Help needed moving from lying on your back to sitting on the side of a flat bed without using bedrails?: None Help needed moving to and from a bed to a chair (including a wheelchair)?: None Help needed standing up from a chair using your arms (e.g., wheelchair or bedside chair)?: None Help needed to walk in hospital room?: A Little Help needed climbing 3-5 steps with a railing? : A Little 6 Click Score: 22    End of Session Equipment Utilized During Treatment: Oxygen;Gait belt Activity Tolerance: Patient tolerated treatment well Patient left: with call bell/phone within reach;in chair;with family/visitor present(MD in room) Nurse Communication: Mobility status PT Visit Diagnosis: Unsteadiness on feet (R26.81);Other abnormalities of gait and mobility (R26.89)     Time: 5188-4166 PT Time Calculation (min) (ACUTE  ONLY): 18 min  Charges:  $Gait Training: 8-22 mins                     Kallie Locks, [place] Pager [phone] Acute Rehab   Sheral Apley 03/28/2019, 1:29 PM

## 2019-03-28 NOTE — Progress Notes (Addendum)
       [address]       [place] [address]             [phone]         Subjective: Feels okay. Occasionally short of breath. Did walk in the halls yesterday and he did okay.   Objective: Vital signs in last 24 hours: Temp:  [97.6 F (36.4 C)-98.8 F (37.1 C)] 98.1 F (36.7 C) (08/21 0640) Pulse Rate:  [80-88] 80 (08/21 0640) Cardiac Rhythm: Normal sinus rhythm (08/20 1904) Resp:  [16-18] 18 (08/21 0640) BP: (110-117)/(69-74) 110/74 (08/21 0640) SpO2:  [91 %-97 %] 97 % (08/21 0640)     Intake/Output from previous day: 08/20 0701 - 08/21 0700 In: -  Out: 300 [Urine:300] Intake/Output this shift: No intake/output data recorded.  General appearance: alert, cooperative and no distress Heart: regular rate and rhythm, S1, S2 normal, no murmur, click, rub or gallop Lungs: clear to auscultation bilaterally Abdomen: soft, non-tender; bowel sounds normal; no masses,  no organomegaly Extremities: extremities normal, atraumatic, no cyanosis or edema Wound: clean and dry  Lab Results: Recent Labs    03/27/19 0326 03/28/19 0215  WBC 7.2 6.5  HGB 14.9 14.5  HCT 47.7 45.1  PLT 141* 128*   BMET:  Recent Labs    03/27/19 0326 03/28/19 0215  NA 137 134*  K 4.3 4.1  CL 87* 84*  CO2 40* 40*  GLUCOSE 91 101*  BUN 21 21  CREATININE 0.73 0.69  CALCIUM 9.6 9.0    PT/INR: No results for input(s): LABPROT, INR in the last 72 hours. ABG    Component Value Date/Time   PHART 7.369 03/25/2019 1753   HCO3 40.5 (H) 03/25/2019 1753   O2SAT 93.5 03/25/2019 1753   CBG (last 3)  No results for input(s): GLUCAP in the last 72 hours.  Assessment/Plan: This patient is a 68 year old male with a past medical history of COPD and tobacco dependence. He was admitted for spontaneous pneumothorax and a pigtail catheter was placed by Dr. Kipp Brood on 8/14. There was complete resolution of the pneumothorax and the chest tube was subsequently removed on 8/17. On 8/18  the patient had a recurrent right spontaneous pneumothorax and required a non-rebreather. A larger bore chest tube was placed by Dr. Cyndia Bent. Follow-up CXR showed almost complete resolution with a trace residual right-sided pneumothorax.   1. Chest tube-currently on water seal with no air leak and no drainage in the atrium. CXR stable this morning so will discontinue chest tube. 2. Pulm-Severe COPD with a history of smoking. Patient is tolerating 4L [place]. Encouraged incentive spirometer hourly.  3. DVT prophylaxis-SCDs in place 4. CV- NSR in the 80s. BP well controlled 5. Continue on home dose of Lasix-40mg  daily 6. Continue Oxycodone PRN for pain at the chest tube site.  Plan: Will remove chest tube today. If lung collapses again will need VATs.  Surgery explained to the patient in detail and he voiced understanding. Encourage continued use of incentive spirometer. Ambulate today. Will order repeat CXR for this afternoon.    LOS: 8 days    [name] 03/28/2019   Chart reviewed, patient examined, agree with above. CXR looks good and no air leak this am so tube can be removed. Hopefully will not have recurrent ptx.

## 2019-03-28 NOTE — Progress Notes (Signed)
Patient O@ STAT 88% on 4l palaced 6L O@ stats 90% incentive spirometer 750

## 2019-03-29 ENCOUNTER — Inpatient Hospital Stay (HOSPITAL_COMMUNITY): Payer: Medicare HMO

## 2019-03-29 LAB — CBC
HCT: 45 % (ref 39.0–52.0)
Hemoglobin: 14.3 g/dL (ref 13.0–17.0)
MCH: 31.7 pg (ref 26.0–34.0)
MCHC: 31.8 g/dL (ref 30.0–36.0)
MCV: 99.8 fL (ref 80.0–100.0)
Platelets: 144 K/uL — ABNORMAL LOW (ref 150–400)
RBC: 4.51 MIL/uL (ref 4.22–5.81)
RDW: 12 % (ref 11.5–15.5)
WBC: 5.6 K/uL (ref 4.0–10.5)
nRBC: 0 % (ref 0.0–0.2)

## 2019-03-29 LAB — MAGNESIUM: Magnesium: 1.6 mg/dL — ABNORMAL LOW (ref 1.7–2.4)

## 2019-03-29 LAB — BASIC METABOLIC PANEL WITH GFR
Anion gap: 11 (ref 5–15)
BUN: 20 mg/dL (ref 8–23)
CO2: 39 mmol/L — ABNORMAL HIGH (ref 22–32)
Calcium: 8.9 mg/dL (ref 8.9–10.3)
Chloride: 83 mmol/L — ABNORMAL LOW (ref 98–111)
Creatinine, Ser: 0.73 mg/dL (ref 0.61–1.24)
GFR calc Af Amer: >60 mL/min
GFR calc non Af Amer: >60 mL/min
Glucose, Bld: 83 mg/dL (ref 70–99)
Potassium: 4.2 mmol/L (ref 3.5–5.1)
Sodium: 133 mmol/L — ABNORMAL LOW (ref 135–145)

## 2019-03-29 MED ORDER — MAGNESIUM OXIDE 400 (241.3 MG) MG PO TABS
800.0000 mg | ORAL_TABLET | Freq: Two times a day (BID) | ORAL | Status: AC
  Administered 2019-03-29 (×2): 800 mg via ORAL
  Filled 2019-03-29 (×2): qty 2

## 2019-03-29 NOTE — Progress Notes (Signed)
PROGRESS NOTE    Derrick [name]  KAJ:[id number] DOB: [birth year] DOA: 03/20/2019 PCP: Gareth Morgan, MD   Brief Narrative:  68 y.o.malewith medical history significant forCOPD, pulmonary hypertension, heavy tobacco user, diastolic CHF and rheumatoid arthritis, [facility] with sudden onset difficulty breathing.Noted to be hypoxic in the ER requiring 6 L nasal cannula.  COVID-negative.  Chest x-ray showed large right-sided pneumothorax requiring chest tube placement.  Repeat x-rays showed improvement.  CT surgery were consulted.  Initial chest tube placed 8/14, subsequent resolution of pneumothorax therefore it was removed on 8/17.  Patient developed recurrent right-sided pneumothorax with tension therefore chest tube placed again on 8/18.   Assessment & Plan:   Active Problems:   Acute respiratory failure with hypoxia (HCC)   Spontaneous pneumothorax   Protein-calorie malnutrition, severe   Chest tube in place   Status post chest tube placement [facility])   Palliative care by specialist   DNR (do not resuscitate) discussion   Weakness generalized  Acute recurrent spontaneous pneumothorax with tension and hypoxia Acute on chronic hypoxia, 3 L nasal cannula.  Currently on 6 L nasal cannula. - Initial chest tube placed 8/14, removed 8/17. - Recurrent large pneumothorax with tension 8/18, chest tube replaced 8/18. -Chest tube removed 8/21. Still gets hypoxic with minimal exertion.  -CT surgery following -Appreciate input from Palliaitive acare.  - Continue Bronchodilators.  Incentive spirometer.  Supplemental oxygen. Repeat CXR= trace PTX at medial right lung apex.  Repeat CXR tomorrow.   History of COPD with severe emphysema with active tobacco use - Cont Bronchodilators. Not wheezing.   Chronic diastolic congestive heart failure with preserved ejection fraction, 60% -Continue home medications.  No signs of obvious fluid overload.  Echo in February 2020 showed ejection  fraction 60%.  Severe protein calorie malnutrition -Nutrition team consulted.  At high risk for recurrent pneumothorax  DVT prophylaxis: SCDs Code Status: Full code Family Communication: None Disposition Plan: Maintain hosp stay. Still gets very hypoxic. Not ready for discharge.   Consultants:   CT surgery  Procedures:   Chest tube initial placement 8/14-removed 8/17  Chest tube placed again 8/18, removed 8/21  Antimicrobials:   None   Subjective: Feels ok at rest, but gets hypoxic with minimal exertion.   Review of Systems Otherwise negative except as per HPI, including: General = no fevers, chills, dizziness, malaise, fatigue HEENT/EYES = negative for pain, redness, loss of vision, double vision, blurred vision, loss of hearing, sore throat, hoarseness, dysphagia Cardiovascular= negative for chest pain, palpitation, murmurs, lower extremity swelling Respiratory/lungs= negative for shortness of breath, cough, hemoptysis, wheezing, mucus production Gastrointestinal= negative for nausea, vomiting,, abdominal pain, melena, hematemesis Genitourinary= negative for Dysuria, Hematuria, Change in Urinary Frequency MSK = Negative for arthralgia, myalgias, Back Pain, Joint swelling  Neurology= Negative for headache, seizures, numbness, tingling  Psychiatry= Negative for anxiety, depression, suicidal and homocidal ideation Allergy/Immunology= Medication/Food allergy as listed  Skin= Negative for Rash, lesions, ulcers, itching   Objective: Vitals:   03/28/19 1427 03/28/19 2000 03/28/19 2125 03/29/19 0536  BP: 127/83  105/69 112/81  Pulse: 81  78 75  Resp: 15   18  Temp: 97.7 F (36.5 C)  98.6 F (37 C) 98.4 F (36.9 C)  TempSrc:   Oral Oral  SpO2: 94% (!) 88% 96% 100%  Weight:      Height:        Intake/Output Summary (Last 24 hours) at 03/29/2019 1011 Last data filed at 03/29/2019 0852 Gross per 24 hour  Intake -  Output 850 ml  Net -850 ml   Filed Weights    03/20/19 2250 03/25/19 0424  Weight: 51.7 kg 50 kg    Examination: Constitutional: NAD, calm, comfortable, b/l temporal wasting.  Eyes: PERRL, lids and conjunctivae normal ENMT: Mucous membranes are moist. Posterior pharynx clear of any exudate or lesions.Normal dentition.  Neck: normal, supple, no masses, no thyromegaly Respiratory: b/l diminished BS Cardiovascular: Regular rate and rhythm, no murmurs / rubs / gallops. No extremity edema. 2+ pedal pulses. No carotid bruits.  Abdomen: no tenderness, no masses palpated. No hepatosplenomegaly. Bowel sounds positive.  Musculoskeletal: no clubbing / cyanosis. No joint deformity upper and lower extremities. Good ROM, no contractures. Normal muscle tone.  Skin: no rashes, lesions, ulcers. No induration Neurologic: CN 2-12 grossly intact. Sensation intact, DTR normal. Strength 4/5 in all 4.  Psychiatric: Normal judgment and insight. Alert and oriented x 3. Normal mood.    Data Reviewed:   CBC: Recent Labs  Lab 03/25/19 0309 03/26/19 0301 03/27/19 0326 03/28/19 0215 03/29/19 0227  WBC 6.7 7.2 7.2 6.5 5.6  HGB 15.3 15.2 14.9 14.5 14.3  HCT 48.2 47.9 47.7 45.1 45.0  MCV 100.2* 99.0 101.5* 99.3 99.8  PLT 124* 133* 141* 128* 144*   Basic Metabolic Panel: Recent Labs  Lab 03/25/19 0309 03/26/19 0301 03/27/19 0326 03/28/19 0215 03/29/19 0227  NA 137 138 137 134* 133*  K 4.4 4.5 4.3 4.1 4.2  CL 90* 88* 87* 84* 83*  CO2 36* 40* 40* 40* 39*  GLUCOSE 89 107* 91 101* 83  BUN 24* 24* 21 21 20   CREATININE 0.88 0.70 0.73 0.69 0.73  CALCIUM 9.4 9.8 9.6 9.0 8.9  MG 1.7 1.8 1.7 1.6* 1.6*   GFR: Estimated Creatinine Clearance: 64.2 mL/min (by C-G formula based on SCr of 0.73 mg/dL). Liver Function Tests: Recent Labs  Lab 03/25/19 0309 03/26/19 0301 03/27/19 0326  AST 17 22 22   ALT 11 14 13   ALKPHOS 59 56 64  BILITOT 1.2 1.4* 1.3*  PROT 6.1* 6.9 6.7  ALBUMIN 3.3* 3.6 3.4*   No results for input(s): LIPASE, AMYLASE in the last  168 hours. No results for input(s): AMMONIA in the last 168 hours. Coagulation Profile: No results for input(s): INR, PROTIME in the last 168 hours. Cardiac Enzymes: No results for input(s): CKTOTAL, CKMB, CKMBINDEX, TROPONINI in the last 168 hours. BNP (last 3 results) No results for input(s): PROBNP in the last 8760 hours. HbA1C: No results for input(s): HGBA1C in the last 72 hours. CBG: No results for input(s): GLUCAP in the last 168 hours. Lipid Profile: No results for input(s): CHOL, HDL, LDLCALC, TRIG, CHOLHDL, LDLDIRECT in the last 72 hours. Thyroid Function Tests: No results for input(s): TSH, T4TOTAL, FREET4, T3FREE, THYROIDAB in the last 72 hours. Anemia Panel: No results for input(s): VITAMINB12, FOLATE, FERRITIN, TIBC, IRON, RETICCTPCT in the last 72 hours. Sepsis Labs: No results for input(s): PROCALCITON, LATICACIDVEN in the last 168 hours.  Recent Results (from the past 240 hour(s))  SARS Coronavirus 2 [facility](Hospital order, Performed [facility]) Nasopharyngeal Nasopharyngeal Swab     Status: None   Collection Time: 03/20/19  2:17 PM   Specimen: Nasopharyngeal Swab  Result Value Ref Range Status   SARS Coronavirus 2 NEGATIVE NEGATIVE Final    Comment: (NOTE) If result is NEGATIVE SARS-CoV-2 target nucleic acids are NOT DETECTED. The SARS-CoV-2 RNA is generally detectable in upper and lower  respiratory specimens during the acute phase of infection. The lowest  concentration of SARS-CoV-2 viral copies this assay can detect is 250  copies / mL. A negative result does not preclude SARS-CoV-2 infection  and should not be used as the sole basis for treatment or other  patient management decisions.  A negative result may occur with  improper specimen collection / handling, submission of specimen other  than nasopharyngeal swab, presence of viral mutation(s) within the  areas targeted by this assay, and inadequate number of viral copies  (<250 copies / mL). A  negative result must be combined with clinical  observations, patient history, and epidemiological information. If result is POSITIVE SARS-CoV-2 target nucleic acids are DETECTED. The SARS-CoV-2 RNA is generally detectable in upper and lower  respiratory specimens dur ing the acute phase of infection.  Positive  results are indicative of active infection with SARS-CoV-2.  Clinical  correlation with patient history and other diagnostic information is  necessary to determine patient infection status.  Positive results do  not rule out bacterial infection or co-infection with other viruses. If result is PRESUMPTIVE POSTIVE SARS-CoV-2 nucleic acids MAY BE PRESENT.   A presumptive positive result was obtained on the submitted specimen  and confirmed on repeat testing.  While 2019 novel coronavirus  (SARS-CoV-2) nucleic acids may be present in the submitted sample  additional confirmatory testing may be necessary for epidemiological  and / or clinical management purposes  to differentiate between  SARS-CoV-2 and other Sarbecovirus currently known to infect humans.  If clinically indicated additional testing with an alternate test  methodology [phone]([id number]) is advised. The SARS-CoV-2 RNA is generally  detectable in upper and lower respiratory sp ecimens during the acute  phase of infection. The expected result is Negative. Fact Sheet for Patients:  BoilerBrush.com.cyhttps://www.fda.gov/media/136312/download Fact Sheet for Healthcare Providers: https://pope.com/https://www.fda.gov/media/136313/download This test is not yet approved or cleared by the [place] FDA and has been authorized for detection and/or diagnosis of SARS-CoV-2 by FDA under an Emergency Use Authorization (EUA).  This EUA will remain in effect (meaning this test can be used) for the duration of the COVID-19 declaration under Section 564(b)(1) of the Act, 21 U.S.C. section 360bbb-3(b)(1), unless the authorization is terminated or revoked sooner. Performed  at [facility], [address] [place], [place] [id number]          Radiology Studies: [facility]  Result Date: 03/29/2019 CLINICAL DATA:  Chest tube removal EXAM: PORTABLE CHEST 1 VIEW COMPARISON:  03/28/2019 FINDINGS: Trace pneumothorax at the medial right lung apex, unchanged. Bullous emphysematous changes in the upper lobes, right greater than left. No pleural effusion. The heart is normal in size.  Thoracic aortic atherosclerosis. IMPRESSION: Trace pneumothorax at the medial right lung apex, unchanged. Electronically Signed   By: Charline BillsSriyesh  Krishnan M.D.   On: 03/29/2019 09:59   Dg Chest Port 1 View  Result Date: 03/28/2019 CLINICAL DATA:  Encounter for chest tube removal. EXAM: PORTABLE CHEST 1 VIEW COMPARISON:  Radiograph of same day. FINDINGS: The heart size and mediastinal contours are within normal limits. Atherosclerosis of thoracic aorta is noted. Stable emphysematous disease is noted in both lungs. Right-sided chest tube has been removed. Small right apical pneumothorax noted on prior exam is not significantly changed. No pleural effusion is noted. The visualized skeletal structures are unremarkable. IMPRESSION: Stable small right apical pneumothorax is noted status post chest tube removal. Aortic Atherosclerosis (ICD10-I70.0) and Emphysema (ICD10-J43.9). Electronically Signed   By: Lupita RaiderJames  Green Jr M.D.   On: 03/28/2019 11:49   [facility]  Result Date: 03/28/2019 CLINICAL DATA:  Chest tube, pneumothorax EXAM: PORTABLE CHEST 1 VIEW COMPARISON:  03/27/2019 FINDINGS: Right chest tube remains in place, unchanged. Small right apical pneumothorax again noted, stable. There is hyperinflation of the lungs compatible with COPD. Heart is normal size. No effusions. IMPRESSION: Stable small right apical pneumothorax with right chest tube in stable position. COPD. Electronically Signed   By: Rolm Baptise M.D.   On: 03/28/2019 08:13        Scheduled Meds: . feeding  supplement (ENSURE ENLIVE)  237 mL Oral TID BM  . fluticasone  1 spray Each Nare Daily  . furosemide  40 mg Oral Daily  . lidocaine  30 mL Intradermal Once  . magnesium oxide  800 mg Oral BID  . multivitamin with minerals  1 tablet Oral Daily   Continuous Infusions:   LOS: 9 days   Time spent= 25 mins    Nalea Salce Arsenio Loader, MD [facility]  If 7PM-7AM, please contact night-coverage [email or web address] 03/29/2019, 10:11 AM

## 2019-03-29 NOTE — Progress Notes (Signed)
      [address]       [place] [address]             [phone]         Subjective: Feels okay this morning. Does get short of breath easily.   Objective: Vital signs in last 24 hours: Temp:  [97.7 F (36.5 C)-98.6 F (37 C)] 98.4 F (36.9 C) (08/22 0536) Pulse Rate:  [75-81] 75 (08/22 0536) Resp:  [15-18] 18 (08/22 0536) BP: (105-127)/(69-83) 112/81 (08/22 0536) SpO2:  [88 %-100 %] 100 % (08/22 0536)     Intake/Output from previous day: 08/21 0701 - 08/22 0700 In: -  Out: 750 [Urine:750] Intake/Output this shift: Total I/O In: 120 [P.O.:120] Out: 550 [Urine:550]  General appearance: alert, cooperative and no distress Heart: regular rate and rhythm, S1, S2 normal, no murmur, click, rub or gallop Lungs: clear to auscultation bilaterally Abdomen: soft, non-tender; bowel sounds normal; no masses,  no organomegaly Extremities: extremities normal, atraumatic, no cyanosis or edema Wound: clean and dry  Lab Results: Recent Labs    03/28/19 0215 03/29/19 0227  WBC 6.5 5.6  HGB 14.5 14.3  HCT 45.1 45.0  PLT 128* 144*   BMET:  Recent Labs    03/28/19 0215 03/29/19 0227  NA 134* 133*  K 4.1 4.2  CL 84* 83*  CO2 40* 39*  GLUCOSE 101* 83  BUN 21 20  CREATININE 0.69 0.73  CALCIUM 9.0 8.9    PT/INR: No results for input(s): LABPROT, INR in the last 72 hours. ABG    Component Value Date/Time   PHART 7.369 03/25/2019 1753   HCO3 40.5 (H) 03/25/2019 1753   O2SAT 93.5 03/25/2019 1753   CBG (last 3)  No results for input(s): GLUCAP in the last 72 hours.  Assessment/Plan: This patient is a 68 year old male with a past medical history of COPD and tobacco dependence. He was admitted for spontaneous pneumothorax and a pigtail catheter was placed by Dr. Kipp Brood on 8/14. There was complete resolution of the pneumothorax and the chest tube was subsequently removed on 8/17. On 8/18 the patient had a recurrent right spontaneous pneumothorax and  required a non-rebreather. A larger bore chest tube was placed by Dr. Cyndia Bent. Follow-up CXR showed almost complete resolution with a trace residual right-sided pneumothorax.    1. Chest tube removed 8/22. F/u CXR showed trace right pneumothorax.  2. CXR this morning shows an unchanged trace right pneumothorax. He is on 6L [place]. He usually needs 3-4L at home during the night. Weaning as able.  3. Will sign off but please call back if we are needed. Patient was informed that if his lung collapses again he will most likely need surgery.       LOS: 9 days    Derrick [name] 03/29/2019

## 2019-03-29 NOTE — Plan of Care (Signed)
°  Problem: Activity: °Goal: Risk for activity intolerance will decrease °Outcome: Progressing °  °Problem: Nutrition: °Goal: Adequate nutrition will be maintained °Outcome: Progressing °  °Problem: Skin Integrity: °Goal: Risk for impaired skin integrity will decrease °Outcome: Progressing °  °

## 2019-03-30 ENCOUNTER — Inpatient Hospital Stay (HOSPITAL_COMMUNITY): Payer: Medicare HMO

## 2019-03-30 LAB — CBC
HCT: 44.1 % (ref 39.0–52.0)
Hemoglobin: 14.1 g/dL (ref 13.0–17.0)
MCH: 31.4 pg (ref 26.0–34.0)
MCHC: 32 g/dL (ref 30.0–36.0)
MCV: 98.2 fL (ref 80.0–100.0)
Platelets: 148 10*3/uL — ABNORMAL LOW (ref 150–400)
RBC: 4.49 MIL/uL (ref 4.22–5.81)
RDW: 11.9 % (ref 11.5–15.5)
WBC: 4.8 10*3/uL (ref 4.0–10.5)
nRBC: 0 % (ref 0.0–0.2)

## 2019-03-30 LAB — BASIC METABOLIC PANEL
Anion gap: 12 (ref 5–15)
BUN: 18 mg/dL (ref 8–23)
CO2: 38 mmol/L — ABNORMAL HIGH (ref 22–32)
Calcium: 8.9 mg/dL (ref 8.9–10.3)
Chloride: 84 mmol/L — ABNORMAL LOW (ref 98–111)
Creatinine, Ser: 0.61 mg/dL (ref 0.61–1.24)
GFR calc Af Amer: >60 mL/min (ref >60)
GFR calc non Af Amer: >60 mL/min (ref >60)
Glucose, Bld: 83 mg/dL (ref 70–99)
Potassium: 3.9 mmol/L (ref 3.5–5.1)
Sodium: 134 mmol/L — ABNORMAL LOW (ref 135–145)

## 2019-03-30 LAB — MAGNESIUM: Magnesium: 1.9 mg/dL (ref 1.7–2.4)

## 2019-03-30 LAB — GLUCOSE, CAPILLARY: Glucose-Capillary: 127 mg/dL — ABNORMAL HIGH (ref 70–99)

## 2019-03-30 NOTE — Progress Notes (Signed)
PROGRESS NOTE    Derrick [name]  GBT:[id number] DOB: 26-Jan-1951 DOA: 03/20/2019 PCP: Lemmie Evens, MD   Brief Narrative:  68 y.o.malewith medical history significant forCOPD, pulmonary hypertension, heavy tobacco user, diastolic CHF and rheumatoid arthritis, [facility] with sudden onset difficulty breathing.Noted to be hypoxic in the ER requiring 6 L nasal cannula.  COVID-negative.  Chest x-ray showed large right-sided pneumothorax requiring chest tube placement.  Repeat x-rays showed improvement.  CT surgery were consulted.  Initial chest tube placed 8/14, subsequent resolution of pneumothorax therefore it was removed on 8/17.  Patient developed recurrent right-sided pneumothorax with tension therefore chest tube placed again on 8/18.   Assessment & Plan:   Active Problems:   Acute respiratory failure with hypoxia (HCC)   Spontaneous pneumothorax   Protein-calorie malnutrition, severe   Chest tube in place   Status post chest tube placement [facility])   Palliative care by specialist   DNR (do not resuscitate) discussion   Weakness generalized  Acute recurrent spontaneous pneumothorax with tension and hypoxia Acute on chronic hypoxia, 3 L nasal cannula.  Still 6 L nasal cannula. - Initial chest tube placed 8/14, removed 8/17.  Will need higher oxygen concentrator at home. - Recurrent large pneumothorax with tension 8/18, chest tube replaced 8/18. -Chest tube removed 8/21. Still gets hypoxic with minimal exertion.  -CT surgery following -Appreciate input from Palliaitive acare.  - Continue Bronchodilators.  Incentive spirometer.  Supplemental oxygen. Repeat CXR= trace PTX at medial right lung apex.   History of COPD with severe emphysema with active tobacco use - Cont Bronchodilators. Not wheezing.   Chronic diastolic congestive heart failure with preserved ejection fraction, 60% -Continue home medications.  No signs of obvious fluid overload.  Echo in February 2020  showed ejection fraction 60%.  Severe protein calorie malnutrition -Nutrition team consulted.  He is at a very high risk of recurrent pneumothorax, at that point will need surgical intervention  DVT prophylaxis: SCDs Code Status: Full code Family Communication: Mal Amabile, she is aware of her guarded prognosis.  Disposition Plan: Still hypoxic requiring 6 L nasal cannula.  If we are not able to wean him off anymore, will discharge him tomorrow but high requirement of oxygen at home.  Consultants:   CT surgery  Procedures:   Chest tube initial placement 8/14-removed 8/17  Chest tube placed again 8/18, removed 8/21  Antimicrobials:   None   Subjective: Ok at rest but significant hypoxia with minimal exertion.   Review of Systems Otherwise negative except as per HPI, including: General = no fevers, chills, dizziness, malaise, fatigue HEENT/EYES = negative for pain, redness, loss of vision, double vision, blurred vision, loss of hearing, sore throat, hoarseness, dysphagia Cardiovascular= negative for chest pain, palpitation, murmurs, lower extremity swelling Respiratory/lungs= negative for shortness of breath, cough, hemoptysis, wheezing, mucus production Gastrointestinal= negative for nausea, vomiting,, abdominal pain, melena, hematemesis Genitourinary= negative for Dysuria, Hematuria, Change in Urinary Frequency MSK = Negative for arthralgia, myalgias, Back Pain, Joint swelling  Neurology= Negative for headache, seizures, numbness, tingling  Psychiatry= Negative for anxiety, depression, suicidal and homocidal ideation Allergy/Immunology= Medication/Food allergy as listed  Skin= Negative for Rash, lesions, ulcers, itching  Objective: Vitals:   03/29/19 1356 03/29/19 2027 03/30/19 0456 03/30/19 1349  BP: 116/76 109/73 98/70 107/67  Pulse: 84 76 68 81  Resp: 20 20 19 20   Temp: 98.2 F (36.8 C) 98 F (36.7 C) 97.8 F (36.6 C) 98.1 F (36.7 C)  TempSrc: Oral Oral Oral  SpO2: 96% 98% 96% 100%  Weight:      Height:        Intake/Output Summary (Last 24 hours) at 03/30/2019 1425 Last data filed at 03/30/2019 1245 Gross per 24 hour  Intake 480 ml  Output 1820 ml  Net -1340 ml   Filed Weights   03/20/19 2250 03/25/19 0424  Weight: 51.7 kg 50 kg    Examination: Constitutional: NAD, calm, comfortable, b/l Temporal wasting.  Eyes: PERRL, lids and conjunctivae normal ENMT: Mucous membranes are moist. Posterior pharynx clear of any exudate or lesions.Normal dentition.  Neck: normal, supple, no masses, no thyromegaly Respiratory: diffuse diminished BS Cardiovascular: Regular rate and rhythm, no murmurs / rubs / gallops. No extremity edema. 2+ pedal pulses. No carotid bruits.  Abdomen: no tenderness, no masses palpated. No hepatosplenomegaly. Bowel sounds positive.  Musculoskeletal: no clubbing / cyanosis. No joint deformity upper and lower extremities. Good ROM, no contractures. Normal muscle tone.  Skin: no rashes, lesions, ulcers. No induration Neurologic: CN 2-12 grossly intact. Sensation intact, DTR normal. Strength 4/5 in all 4.  Psychiatric: Normal judgment and insight. Alert and oriented x 3. Normal mood.   Data Reviewed:   CBC: Recent Labs  Lab 03/26/19 0301 03/27/19 0326 03/28/19 0215 03/29/19 0227 03/30/19 0438  WBC 7.2 7.2 6.5 5.6 4.8  HGB 15.2 14.9 14.5 14.3 14.1  HCT 47.9 47.7 45.1 45.0 44.1  MCV 99.0 101.5* 99.3 99.8 98.2  PLT 133* 141* 128* 144* 148*   Basic Metabolic Panel: Recent Labs  Lab 03/26/19 0301 03/27/19 0326 03/28/19 0215 03/29/19 0227 03/30/19 0438  NA 138 137 134* 133* 134*  K 4.5 4.3 4.1 4.2 3.9  CL 88* 87* 84* 83* 84*  CO2 40* 40* 40* 39* 38*  GLUCOSE 107* 91 101* 83 83  BUN 24* 21 21 20 18   CREATININE 0.70 0.73 0.69 0.73 0.61  CALCIUM 9.8 9.6 9.0 8.9 8.9  MG 1.8 1.7 1.6* 1.6* 1.9   GFR: Estimated Creatinine Clearance: 64.2 mL/min (by C-G formula based on SCr of 0.61 mg/dL). Liver Function Tests:  Recent Labs  Lab 03/25/19 0309 03/26/19 0301 03/27/19 0326  AST 17 22 22   ALT 11 14 13   ALKPHOS 59 56 64  BILITOT 1.2 1.4* 1.3*  PROT 6.1* 6.9 6.7  ALBUMIN 3.3* 3.6 3.4*   No results for input(s): LIPASE, AMYLASE in the last 168 hours. No results for input(s): AMMONIA in the last 168 hours. Coagulation Profile: No results for input(s): INR, PROTIME in the last 168 hours. Cardiac Enzymes: No results for input(s): CKTOTAL, CKMB, CKMBINDEX, TROPONINI in the last 168 hours. BNP (last 3 results) No results for input(s): PROBNP in the last 8760 hours. HbA1C: No results for input(s): HGBA1C in the last 72 hours. CBG: Recent Labs  Lab 03/30/19 0740  GLUCAP 127*   Lipid Profile: No results for input(s): CHOL, HDL, LDLCALC, TRIG, CHOLHDL, LDLDIRECT in the last 72 hours. Thyroid Function Tests: No results for input(s): TSH, T4TOTAL, FREET4, T3FREE, THYROIDAB in the last 72 hours. Anemia Panel: No results for input(s): VITAMINB12, FOLATE, FERRITIN, TIBC, IRON, RETICCTPCT in the last 72 hours. Sepsis Labs: No results for input(s): PROCALCITON, LATICACIDVEN in the last 168 hours.  No results found for this or any previous visit (from the past 240 hour(s)).       Radiology Studies: Dg Chest Port 1 View  Result Date: 03/30/2019 CLINICAL DATA:  Dyspnea EXAM: PORTABLE CHEST 1 VIEW COMPARISON:  03/29/2019 FINDINGS: Lungs are hyperinflated. Marked emphysematous changes are  identified at the apices, RIGHT greater than LEFT. No evidence for residual pneumothorax. Heart size is normal. Atherosclerotic calcification of the thoracic aorta. IMPRESSION: 1. COPD. 2. No evidence for acute cardiopulmonary abnormality. Electronically Signed   By: Norva Pavlov M.D.   On: 03/30/2019 08:44   Dg Chest Port 1 View  Result Date: 03/29/2019 CLINICAL DATA:  Chest tube removal EXAM: PORTABLE CHEST 1 VIEW COMPARISON:  03/28/2019 FINDINGS: Trace pneumothorax at the medial right lung apex, unchanged.  Bullous emphysematous changes in the upper lobes, right greater than left. No pleural effusion. The heart is normal in size.  Thoracic aortic atherosclerosis. IMPRESSION: Trace pneumothorax at the medial right lung apex, unchanged. Electronically Signed   By: Charline Bills M.D.   On: 03/29/2019 09:59        Scheduled Meds: . feeding supplement (ENSURE ENLIVE)  237 mL Oral TID BM  . fluticasone  1 spray Each Nare Daily  . furosemide  40 mg Oral Daily  . lidocaine  30 mL Intradermal Once  . multivitamin with minerals  1 tablet Oral Daily   Continuous Infusions:   LOS: 10 days   Time spent= 25 mins    Ankit Joline Maxcy, MD [facility]  If 7PM-7AM, please contact night-coverage [email or web address] 03/30/2019, 2:25 PM

## 2019-03-31 LAB — MAGNESIUM: Magnesium: 1.8 mg/dL (ref 1.7–2.4)

## 2019-03-31 LAB — BASIC METABOLIC PANEL
Anion gap: 10 (ref 5–15)
BUN: 19 mg/dL (ref 8–23)
CO2: 40 mmol/L — ABNORMAL HIGH (ref 22–32)
Calcium: 9.2 mg/dL (ref 8.9–10.3)
Chloride: 86 mmol/L — ABNORMAL LOW (ref 98–111)
Creatinine, Ser: 1.18 mg/dL (ref 0.61–1.24)
GFR calc Af Amer: >60 mL/min (ref >60)
GFR calc non Af Amer: >60 mL/min (ref >60)
Glucose, Bld: 79 mg/dL (ref 70–99)
Potassium: 4.2 mmol/L (ref 3.5–5.1)
Sodium: 136 mmol/L (ref 135–145)

## 2019-03-31 LAB — CBC
HCT: 43.6 % (ref 39.0–52.0)
Hemoglobin: 14.1 g/dL (ref 13.0–17.0)
MCH: 31.9 pg (ref 26.0–34.0)
MCHC: 32.3 g/dL (ref 30.0–36.0)
MCV: 98.6 fL (ref 80.0–100.0)
Platelets: 160 10*3/uL (ref 150–400)
RBC: 4.42 MIL/uL (ref 4.22–5.81)
RDW: 11.7 % (ref 11.5–15.5)
WBC: 5.5 10*3/uL (ref 4.0–10.5)
nRBC: 0 % (ref 0.0–0.2)

## 2019-03-31 NOTE — Progress Notes (Signed)
SATURATION QUALIFICATIONS: (This note is used to comply with regulatory documentation for home oxygen)  Patient Saturations on Room Air at Rest = 80%  Patient Saturations on Room Air while Ambulating = 80%  Patient Saturations on 6 Liters of oxygen while Ambulating = 94%  Please briefly explain why patient needs home oxygen:

## 2019-03-31 NOTE — Progress Notes (Signed)
Nsg Discharge Note  Admit Date:  03/20/2019 Discharge date: 03/31/2019   Derrick [name] to be D/C'd Home per MD order.  AVS completed.  Copy for chart, and copy for patient signed, and dated. Patient/caregiver able to verbalize understanding.  Discharge Medication: Allergies as of 03/31/2019   No Known Allergies     Medication List    STOP taking these medications   OXYGEN     TAKE these medications   albuterol 108 (90 Base) MCG/ACT inhaler Commonly known as: VENTOLIN HFA Inhale 1-2 puffs into the lungs every 6 (six) hours as needed for wheezing or shortness of breath.   albuterol (2.5 MG/3ML) 0.083% nebulizer solution Commonly known as: PROVENTIL Take 3 mLs (2.5 mg total) by nebulization every 4 (four) hours as needed for wheezing or shortness of breath.   carvedilol 3.125 MG tablet Commonly known as: COREG Take 1 tablet (3.125 mg total) by mouth 2 (two) times daily.   [facility] Vitamin B12 1000 MCG tablet Generic drug: cyanocobalamin Take 1,000 mcg by mouth daily.   fluticasone 50 MCG/ACT nasal spray Commonly known as: FLONASE Place 1 spray into both nostrils daily. What changed: how much to take   furosemide 40 MG tablet Commonly known as: LASIX 1 (40 mg) Tab po qam and half- Tab (20 mg) qpm What changed:   how much to take  how to take this  when to take this  additional instructions   lisinopril 2.5 MG tablet Commonly known as: ZESTRIL Take 1 tablet (2.5 mg total) by mouth daily.   potassium chloride SA 20 MEQ tablet Commonly known as: Klor-Con M20 Take 1 tablet (20 mEq total) by mouth daily.            Durable Medical Equipment  (From admission, onward)         Start     Ordered   03/31/19 1024  For home use only DME oxygen  Once    Comments: Please deliver stationary concentrators and portable tanks  Question Answer Comment  Length of Need Lifetime   Mode or (Route) Nasal cannula   Liters per Minute 6   Frequency Continuous (stationary and  portable oxygen unit needed)   Oxygen conserving device Yes   Oxygen delivery system Gas      03/31/19 1025   03/31/19 0835  For home use only DME Pulse oximeter  Once     03/31/19 0836          Discharge Assessment: Vitals:   03/30/19 1349 03/30/19 2214  BP: 107/67 116/80  Pulse: 81 83  Resp: 20 16  Temp: 98.1 F (36.7 C) 97.8 F (36.6 C)  SpO2: 100% 98%   Skin clean, dry and intact without evidence of skin break down, no evidence of skin tears noted. IV catheter discontinued intact. Site without signs and symptoms of complications - no redness or edema noted at insertion site, patient denies c/o pain - only slight tenderness at site.  Dressing with slight pressure applied.  D/c Instructions-Education: Discharge instructions given to patient/family with verbalized understanding. D/c education completed with patient/family including follow up instructions, medication list, d/c activities limitations if indicated, with other d/c instructions as indicated by MD - patient able to verbalize understanding, all questions fully answered. Patient instructed to return to ED, call 911, or call MD for any changes in condition.  Patient escorted via [place], and D/C home via private auto.  Tresa Endo, RN 03/31/2019 2:40 PM

## 2019-03-31 NOTE — Care Management Important Message (Signed)
Important Message  Patient Details  Name: Derrick [name] MRN: [id number] Date of Birth: July 23, 1951   Medicare Important Message Given:  Yes     [name] 03/31/2019, 8:36 AM

## 2019-03-31 NOTE — TOC Initial Note (Signed)
Transition of Care [facility]) - Initial/Assessment Note    Patient Details  Name: Derrick [name] MRN: [id number] Date of Birth: Nov 20, 1950  [facility]) CM/SW Contact:    Benard Halsted, LCSW Phone Number: 03/31/2019, 9:58 AM  Clinical Narrative:                 Patient has had [facility] home [facility] earlier this year with home oxygen and a cane. CSW sending referral for review.   Expected Discharge Plan: [place] Barriers to Discharge: No Barriers Identified   Patient Goals and CMS Choice Patient states their goals for this hospitalization and ongoing recovery are:: Return home CMS Medicare.gov Compare Post Acute Care list provided to:: Patient Choice offered to / list presented to : Patient  Expected Discharge Plan and Services Expected Discharge Plan: [place] In-house Referral: NA Discharge Planning Services: CM Consult Post Acute Care Choice: [place] arrangements for the past 2 months: Single Family Home Expected Discharge Date: 03/31/19               DME Arranged: N/A DME Agency: NA       HH Arranged: RN, PT, OT HH Agency: [facility] Date HH Agency Contacted: 03/31/19 Time HH Agency Contacted: [phone] Representative spoke with at [place]: Malachy Mood  Prior Living Arrangements/Services Living arrangements for the past 2 months: [place] Lives with:: Self Patient language and need for interpreter reviewed:: Yes Do you feel safe going back to the place where you live?: Yes      Need for Family Participation in Patient Care: No (Comment) Care giver support system in place?: Yes (comment) Current home services: DME Criminal Activity/Legal Involvement Pertinent to Current Situation/Hospitalization: No - Comment as needed  Activities of Daily Living Home Assistive Devices/Equipment: None ADL Screening (condition at time of admission) Patient's cognitive ability adequate to safely complete  daily activities?: Yes Is the patient deaf or have difficulty hearing?: No Does the patient have difficulty seeing, even when wearing glasses/contacts?: No Does the patient have difficulty concentrating, remembering, or making decisions?: No Patient able to express need for assistance with ADLs?: Yes Does the patient have difficulty dressing or bathing?: No Independently performs ADLs?: Yes (appropriate for developmental age) Does the patient have difficulty walking or climbing stairs?: No Weakness of Legs: None Weakness of Arms/Hands: None  Permission Sought/Granted Permission sought to share information with : Facility Art therapist granted to share information with : Yes, Verbal Permission Granted     Permission granted to share info w AGENCY: Home Health        Emotional Assessment Appearance:: Appears stated age Attitude/Demeanor/Rapport: Gracious Affect (typically observed): Accepting Orientation: : Oriented to Self, Oriented to Place, Oriented to  Time, Oriented to Situation Alcohol / Substance Use: Not Applicable Psych Involvement: No (comment)  Admission diagnosis:  Spontaneous pneumothorax [J93.83] Hypoxia [R09.02] Acute respiratory failure with hypoxia ([place]) [J96.01] Status post chest tube placement [facility]) [Z93.8] Patient Active Problem List   Diagnosis Date Noted  . Palliative care by specialist   . DNR (do not resuscitate) discussion   . Weakness generalized   . Protein-calorie malnutrition, severe 03/22/2019  . Chest tube in place   . Status post chest tube placement ([place])   . Spontaneous pneumothorax 03/20/2019  . Acute respiratory failure with hypoxia ([place]) 10/04/2018  . Cellulitis 10/04/2018  . Acute on chronic diastolic CHF (congestive heart failure) ([place]) 10/04/2018  . NSVT (nonsustained  ventricular tachycardia) (HCC) 07/08/2017  . Pulmonary HTN (HCC) 07/08/2017  . COPD exacerbation (HCC) 07/06/2017   PCP:  Gareth Morgan,  MD Pharmacy[phone] [place], [place] - [address] AT [facility] [address] [place] [address] Phone: [phone] Fax: [phone]     Social Determinants of Health (SDOH) Interventions    Readmission Risk Interventions No flowsheet data found.

## 2019-03-31 NOTE — TOC Transition Note (Signed)
Transition of Care [facility]) - CM/SW Discharge Note   Patient Details  Name: Derrick [name] MRN: [id number] Date of Birth: 01/31/1951  Transition of Care [facility]) CM/SW Contact:  Benard Halsted, LCSW Phone Number: 03/31/2019, 1:56 PM   Clinical Narrative:    Patient has been accepted by [facility]. Patient's portable oxygen tanks have been delivered to room by [facility] (they will set up oxygen at home once patient arrives) and patient can discharge once his ride arrives.    Final next level of care: [place] Barriers to Discharge: No Barriers Identified   Patient Goals and CMS Choice Patient states their goals for this hospitalization and ongoing recovery are:: Return home CMS Medicare.gov Compare Post Acute Care list provided to:: Patient Choice offered to / list presented to : Patient  Discharge Placement                    Patient and family notified of of transfer: 03/31/19  Discharge Plan and Services In-house Referral: NA Discharge Planning Services: CM Consult Post Acute Care Choice: Home Health          DME Arranged: N/A DME Agency: [facility] Date DME Agency Contacted: 03/31/19 Time DME Agency Contacted: [phone] Representative spoke with at DME Agency: Caryl Pina, [phone] [facility] Arranged: RN, PT, OT [facility] Agency: [place] ([place]) Date HH Agency Contacted: 03/31/19 Time [facility]: [id number] Representative spoke with at [place]: [place] ([place]) Interventions     Readmission Risk Interventions No flowsheet data found.

## 2019-03-31 NOTE — Discharge Summary (Signed)
Physician Discharge Summary  Georges Lynchhomas W Gerardo ZOX:[id number] DOB: 23-Jun-1951 DOA: 03/20/2019  PCP: Gareth MorganKnowlton, Steve, MD  Admit date: 03/20/2019 Discharge date: 03/31/2019  Admitted From: Home Disposition: Home with home health  Recommendations for Outpatient Follow-up:  1. Follow up with PCP in 1-2 weeks 2. Please obtain BMP/CBC in one week your next doctors visit.  3. Now on 6 L of nasal cannula   Equipment/Devices: 6 L nasal cannula at home with her oxygen concentrator Discharge Condition: Stable CODE STATUS: DNR/DNI Diet recommendation: As tolerated  Brief/Interim Summary: 68 y.o.malewith medical history significant forCOPD, pulmonary hypertension, heavy tobacco user, diastolic CHF and rheumatoid arthritis, [facility].Noted to be hypoxic in the ER requiring 6 L nasal cannula.  COVID-negative.  Chest x-ray showed large right-sided pneumothorax requiring chest tube placement.  Repeat x-rays showed improvement.  CT surgery were consulted.  Initial chest tube placed 8/14, subsequent resolution of pneumothorax therefore it was removed on 8/17.  Patient developed recurrent right-sided pneumothorax with tension therefore chest tube placed again on 8/18.  He did well over the course of couple of days and eventually was removed on 8/21.  Post removal patient was doing better.  CT surgery recommended surgical intervention in case of relapse again in the future. Patient is remained stable for 48 hours therefore we will discharge him.  Had extensive discussion with the patient and family member, Corrie DandyMary at bedside-they would like to keep patient DNR/DNI.  They understand he is at a high risk for recurrent pneumothorax. Home health to be arranged for, now will be on 6 L nasal cannula-continuous.   Discharge Diagnoses:  Active Problems:   Acute respiratory failure with hypoxia (HCC)   Spontaneous pneumothorax   Protein-calorie malnutrition, severe    Chest tube in place   Status post chest tube placement [facility](HCC)   Palliative care by specialist   DNR (do not resuscitate) discussion   Weakness generalized   Acute recurrent spontaneous pneumothorax with tension and hypoxia Acute on chronic hypoxia, 3 L nasal cannula.    Remains on 6 L nasal cannula - Initial chest tube placed 8/14, removed 8/17.    Home oxygen arrangements will be made for continued 6 L - Recurrent large pneumothorax with tension 8/18, chest tube replaced 8/18. -Chest tube removed 8/21. Still gets hypoxic with minimal exertion.  -CT surgery following -Appreciate input from Palliaitive acare.  - Continue Bronchodilators.  Incentive spirometer.  Supplemental oxygen. Repeat CXR= trace PTX at medial right lung apex.   History of COPD with severe emphysema with active tobacco use - Cont Bronchodilators. Not wheezing.   Chronic diastolic congestive heart failure with preserved ejection fraction, 60% -Continue home medications.  No signs of obvious fluid overload.  Echo in February 2020 showed ejection fraction 60%.  Severe protein calorie malnutrition -Nutrition team consulted.  He is at a very high risk of recurrent pneumothorax, at that point will need surgical intervention.  Discussed extensively with the patient and his family member-[name].  They are aware that he is at a very high risk for recurrent admission and has guarded prognosis  Consultations:  CT surgery  Subjective: Feels okay, doing well on 6 L nasal cannula.  Does get exertional shortness of breath.  Discharge Exam: Vitals:   03/30/19 1349 03/30/19 2214  BP: 107/67 116/80  Pulse: 81 83  Resp: 20 16  Temp: 98.1 F (36.7 C) 97.8 F (36.6 C)  SpO2: 100% 98%   Vitals:   03/29/19 2027 03/30/19  9735 03/30/19 1349 03/30/19 2214  BP: 109/73 98/70 107/67 116/80  Pulse: 76 68 81 83  Resp: 20 19 20 16   Temp: 98 F (36.7 C) 97.8 F (36.6 C) 98.1 F (36.7 C) 97.8 F (36.6 C)  TempSrc: Oral Oral   Oral  SpO2: 98% 96% 100% 98%  Weight:      Height:        General: Pt is alert, awake, not in acute distress, on 6 L nasal cannula, cachectic frail-appearing Cardiovascular: RRR, S1/S2 +, no rubs, no gallops Respiratory: CTA bilaterally, no wheezing, no rhonchi Abdominal: Soft, NT, ND, bowel sounds + Extremities: no edema, no cyanosis  Discharge Instructions   Allergies as of 03/31/2019   No Known Allergies     Medication List    STOP taking these medications   OXYGEN     TAKE these medications   albuterol 108 (90 Base) MCG/ACT inhaler Commonly known as: VENTOLIN HFA Inhale 1-2 puffs into the lungs every 6 (six) hours as needed for wheezing or shortness of breath.   albuterol (2.5 MG/3ML) 0.083% nebulizer solution Commonly known as: PROVENTIL Take 3 mLs (2.5 mg total) by nebulization every 4 (four) hours as needed for wheezing or shortness of breath.   carvedilol 3.125 MG tablet Commonly known as: COREG Take 1 tablet (3.125 mg total) by mouth 2 (two) times daily.   [employer] Vitamin B12 1000 MCG tablet Generic drug: cyanocobalamin Take 1,000 mcg by mouth daily.   fluticasone 50 MCG/ACT nasal spray Commonly known as: FLONASE Place 1 spray into both nostrils daily. What changed: how much to take   furosemide 40 MG tablet Commonly known as: LASIX 1 (40 mg) Tab po qam and half- Tab (20 mg) qpm What changed:   how much to take  how to take this  when to take this  additional instructions   lisinopril 2.5 MG tablet Commonly known as: ZESTRIL Take 1 tablet (2.5 mg total) by mouth daily.   potassium chloride SA 20 MEQ tablet Commonly known as: Klor-Con M20 Take 1 tablet (20 mEq total) by mouth daily.            Durable Medical Equipment  (From admission, onward)         Start     Ordered   03/31/19 1024  For home use only DME oxygen  Once    Comments: Please deliver stationary concentrators and portable tanks  Question Answer Comment  Length of Need  Lifetime   Mode or (Route) Nasal cannula   Liters per Minute 6   Frequency Continuous (stationary and portable oxygen unit needed)   Oxygen conserving device Yes   Oxygen delivery system Gas      03/31/19 1025   03/31/19 0835  For home use only DME Pulse oximeter  Once     03/31/19 0836         Follow-up Information    [facility] Follow [facility].   Specialty: Home Health Services Why: [phone]. Home Health Services arranged.        [facility] Follow up.   Why: Oxygen provider.  Contact information: [address] [place] [address] [phone]        Gareth Morgan, MD. Schedule an appointment as soon as possible for a visit in 1 week(s).   Specialty: Family Medicine Contact information: [address] [place] [address] [phone]        Antoine Poche, MD .  Specialty: Cardiology Contact information: [address] [place] [address] [phone]          No Known Allergies  You were cared for by a hospitalist during your hospital stay. If you have any questions about your discharge medications or the care you received while you were in the hospital after you are discharged, you can call the unit and asked to speak with the hospitalist on call if the hospitalist that took care of you is not available. Once you are discharged, your primary care physician will handle any further medical issues. Please note that no refills for any discharge medications will be authorized once you are discharged, as it is imperative that you return to your primary care physician (or establish a relationship with a primary care physician if you do not have one) for your aftercare needs so that they can reassess your need for medications and monitor your lab values.   Procedures/Studies: Dg Chest 1 View  Result Date: 03/25/2019 CLINICAL DATA:  Shortness of breath.  Recent pneumothorax. EXAM: CHEST  1 VIEW COMPARISON:  Chest  x-rays dated 08/17 2020 and 03/20/2019 FINDINGS: The patient has recurrent large right pneumothorax at least 50%. There now appears to be slight shift of the heart mediastinal structures to the left suggesting tension. Extensive emphysematous changes noted on the left. Heart size and vascularity are normal. No acute bone abnormality. IMPRESSION: Recurrent large right pneumothorax with suggestion of tension. Critical Value/emergent results were called by telephone at the time of interpretation on 03/25/2019 at 6:01 pm to [facility] [place] , who verbally acknowledged these results. Electronically Signed   By: Francene Boyers M.D.   On: 03/25/2019 18:04   Ct Chest Wo Contrast  Result Date: 03/20/2019 CLINICAL DATA:  Follow-up for resolution of pneumothorax. EXAM: CT CHEST WITHOUT CONTRAST TECHNIQUE: Multidetector CT imaging of the chest was performed following the standard protocol without IV contrast. COMPARISON:  Radiographs earlier this day. FINDINGS: Cardiovascular: Moderate aortic atherosclerosis. Aortic tortuosity with ectasia of the descending thoracic aorta, 3.8 cm. Dilated main pulmonary artery at 4 cm. Coronary artery calcifications. Heart is normal in size. No pericardial effusion. Mediastinum/Nodes: No enlarged mediastinal lymph nodes. Limited assessment for hilar adenopathy given lack of IV contrast. The esophagus is decompressed. No visualized thyroid nodule. Lungs/Pleura: Right pigtail catheter in place, tip along the major fissure. Advanced emphysema with large apical bulla, air-filled structure in the right lung apex felt to represent a large bulla rather than residual pneumothorax. Small partially loculated residual pneumothorax noted anterior inferiorly anterior to the right middle lobe. Right lower lobe opacity favoring atelectasis. Large bulla also noted in the left upper lobe. Subsegmental atelectasis in the left upper lobe. No pleural fluid or pulmonary mass. Upper Abdomen: No acute findings.  Atherosclerosis of upper abdominal vasculature. Musculoskeletal: There are no acute or suspicious osseous abnormalities. IMPRESSION: 1. Right pigtail catheter in place, tip along the major fissure. Small partially loculated pneumothorax anterior inferiorly to the right middle lobe. 2. Advanced emphysema with large apical bulla. Air-filled structure in the right lung apex felt to represent a large bulla rather than residual pneumothorax. Right lower lobe opacity favoring atelectasis. 3. Aortic atherosclerosis, ectatic descending thoracic aorta at 3.7 cm. Coronary artery calcifications. 4. Dilated main pulmonary artery suggesting pulmonary arterial hypertension. Aortic Atherosclerosis (ICD10-I70.0) and Emphysema (ICD10-J43.9). Electronically Signed   By: Narda Rutherford M.D.   On: 03/20/2019 18:37   Dg Chest Port 1 View  Result Date: 03/30/2019 CLINICAL DATA:  Dyspnea EXAM: PORTABLE  CHEST 1 VIEW COMPARISON:  03/29/2019 FINDINGS: Lungs are hyperinflated. Marked emphysematous changes are identified at the apices, RIGHT greater than LEFT. No evidence for residual pneumothorax. Heart size is normal. Atherosclerotic calcification of the thoracic aorta. IMPRESSION: 1. COPD. 2. No evidence for acute cardiopulmonary abnormality. Electronically Signed   By: Norva PavlovElizabeth  Brown M.D.   On: 03/30/2019 08:44   Dg Chest Port 1 View  Result Date: 03/29/2019 CLINICAL DATA:  Chest tube removal EXAM: PORTABLE CHEST 1 VIEW COMPARISON:  03/28/2019 FINDINGS: Trace pneumothorax at the medial right lung apex, unchanged. Bullous emphysematous changes in the upper lobes, right greater than left. No pleural effusion. The heart is normal in size.  Thoracic aortic atherosclerosis. IMPRESSION: Trace pneumothorax at the medial right lung apex, unchanged. Electronically Signed   By: Charline BillsSriyesh  Krishnan M.D.   On: 03/29/2019 09:59   Dg Chest Port 1 View  Result Date: 03/28/2019 CLINICAL DATA:  Encounter for chest tube removal. EXAM: PORTABLE  CHEST 1 VIEW COMPARISON:  Radiograph of same day. FINDINGS: The heart size and mediastinal contours are within normal limits. Atherosclerosis of thoracic aorta is noted. Stable emphysematous disease is noted in both lungs. Right-sided chest tube has been removed. Small right apical pneumothorax noted on prior exam is not significantly changed. No pleural effusion is noted. The visualized skeletal structures are unremarkable. IMPRESSION: Stable small right apical pneumothorax is noted status post chest tube removal. Aortic Atherosclerosis (ICD10-I70.0) and Emphysema (ICD10-J43.9). Electronically Signed   By: Lupita RaiderJames  Green Jr M.D.   On: 03/28/2019 11:49   Dg Chest Port 1 View  Result Date: 03/28/2019 CLINICAL DATA:  Chest tube, pneumothorax EXAM: PORTABLE CHEST 1 VIEW COMPARISON:  03/27/2019 FINDINGS: Right chest tube remains in place, unchanged. Small right apical pneumothorax again noted, stable. There is hyperinflation of the lungs compatible with COPD. Heart is normal size. No effusions. IMPRESSION: Stable small right apical pneumothorax with right chest tube in stable position. COPD. Electronically Signed   By: Charlett NoseKevin  Dover M.D.   On: 03/28/2019 08:13   Dg Chest Port 1 View  Result Date: 03/27/2019 CLINICAL DATA:  Chest tube EXAM: PORTABLE CHEST 1 VIEW COMPARISON:  03/25/2019 FINDINGS: Right chest tube remains in place. Small residual right pneumothorax is unchanged. There is hyperinflation of the lungs compatible with COPD. Heart is borderline in size. No confluent opacities or effusions. IMPRESSION: Stable small right apical pneumothorax with right chest tube in place. COPD Electronically Signed   By: Charlett NoseKevin  Dover M.D.   On: 03/27/2019 10:53   Dg Chest Port 1 View  Result Date: 03/25/2019 CLINICAL DATA:  Chest tube EXAM: PORTABLE CHEST 1 VIEW COMPARISON:  Chest x-ray dated March 25, 2019 FINDINGS: There has been interval placement of a right-sided chest tube. There is a trace residual right-sided  pneumothorax. No left-sided pneumothorax. The lungs are hyperexpanded. Coarsened lung markings are noted bilaterally consistent with scarring. Large bowl are noted bilaterally. The heart size is normal two slightly enlarged. Aortic calcifications are noted. The pulmonary arteries are significantly enlarged as seen on prior CT. IMPRESSION: 1. Status post placement of a right-sided chest tube. There is a trace residual right-sided pneumothorax. 2. Multiple additional chronic findings as detailed above, similar across prior studies. Electronically Signed   By: Katherine Mantlehristopher  Green M.D.   On: 03/25/2019 19:55   Dg Chest Port 1 View  Result Date: 03/24/2019 CLINICAL DATA:  Status post right chest tube removal EXAM: PORTABLE CHEST 1 VIEW COMPARISON:  Film from earlier in the same day. FINDINGS: Previously  seen pigtail catheter has been removed in the interval. No significant recurrent pneumothorax is noted. Emphysematous changes are noted in the apices bilaterally. No new focal infiltrate is seen. No bony abnormality is noted. IMPRESSION: No pneumothorax following pigtail removal. Electronically Signed   By: Inez Catalina M.D.   On: 03/24/2019 16:07   Dg Chest Port 1 View  Result Date: 03/24/2019 CLINICAL DATA:  68 year old male with right chest tube EXAM: PORTABLE CHEST 1 VIEW COMPARISON:  Multiple prior most recent March 21, 2019 FINDINGS: Cardiomediastinal silhouette unchanged, incompletely imaged. Unchanged position of right thoracostomy tube. No visualized pneumothorax. Emphysematous changes again noted with chronic interstitial opacities bilaterally. No new confluent airspace disease. IMPRESSION: Unchanged right-sided thoracostomy tube with no pneumothorax. Electronically Signed   By: Corrie Mckusick D.O.   On: 03/24/2019 09:39   Dg Chest Port 1 View  Result Date: 03/21/2019 CLINICAL DATA:  Shortness of breath, follow-up chest tube EXAM: PORTABLE CHEST 1 VIEW COMPARISON:  03/20/2019 FINDINGS: Cardiac shadow  is stable. Right pigtail catheter is again noted. No definitive pneumothorax is seen. No focal infiltrate or sizable effusion is noted. Aortic calcifications are seen. IMPRESSION: No definitive pneumothorax.  Stable right chest tube is noted. Electronically Signed   By: Inez Catalina M.D.   On: 03/21/2019 08:34   Dg Chest Port 1 View  Result Date: 03/20/2019 CLINICAL DATA:  Increasing hypoxia. EXAM: PORTABLE CHEST 1 VIEW COMPARISON:  Plain film CT of earlier today. FINDINGS: 0907 hours. Right-sided pigtail catheter again projects over the right mid lung. No residual pneumothorax identified. Midline trachea. Normal heart size. Prominent pulmonary arteries. Tortuous thoracic aorta. No pleural fluid. Clear lungs. IMPRESSION: Right-sided pigtail catheter in place, without plain film evidence of residual pneumothorax. Clearing of right base airspace disease since radiographs of earlier today. Pulmonary artery enlargement suggests pulmonary arterial hypertension. Electronically Signed   By: Abigail Miyamoto M.D.   On: 03/20/2019 21:22   Dg Chest Portable 1 View  Result Date: 03/20/2019 CLINICAL DATA:  Shortness of breath EXAM: PORTABLE CHEST 1 VIEW COMPARISON:  October 04, 2018 FINDINGS: There is a large pneumothorax on the right without appreciable tension component. Lungs otherwise are clear. Heart is slightly enlarged with mild pulmonary venous hypertension. No adenopathy. There is aortic atherosclerosis. No bone lesions. IMPRESSION: Large pneumothorax on the right without appreciable tension component. No edema or consolidation. Mild cardiac enlargement. Aortic Atherosclerosis (ICD10-I70.0). Critical Value/emergent results were called by telephone at the time of interpretation on 03/20/2019 at 2:44 pm to Dr. Madalyn Rob , who verbally acknowledged these results. Electronically Signed   By: Lowella Grip III M.D.   On: 03/20/2019 14:44   Dg Chest Port 1v Same Day  Result Date: 03/20/2019 CLINICAL DATA:   Chest tube placement for pneumothorax. EXAM: PORTABLE CHEST 1 VIEW COMPARISON:  Radiograph earlier this day at 2:28 p.m. FINDINGS: Right pigtail catheter placement. Decreased right pneumothorax, possible small residual apical component. Minimal right lung base patchy opacity may be atelectasis are re-expansion pulmonary edema. Suspect underlying emphysema. Unchanged heart size and mediastinal contours. No other change from prior exam. IMPRESSION: Right pigtail catheter placement with decreased right pneumothorax, question small residual atypical component. Minimal right lung base opacity may be atelectasis or re-expansion pulmonary edema. Electronically Signed   By: Keith Rake M.D.   On: 03/20/2019 16:29      The results of significant diagnostics from this hospitalization (including imaging, microbiology, ancillary and laboratory) are listed below for reference.     Microbiology: No results found  for this or any previous visit (from the past 240 hour(s)).   Labs: BNP (last 3 results) Recent Labs    10/04/18 1130 03/20/19 1458  BNP 1,874.0* 75.0   Basic Metabolic Panel: Recent Labs  Lab 03/27/19 0326 03/28/19 0215 03/29/19 0227 03/30/19 0438 03/31/19 0432  NA 137 134* 133* 134* 136  K 4.3 4.1 4.2 3.9 4.2  CL 87* 84* 83* 84* 86*  CO2 40* 40* 39* 38* 40*  GLUCOSE 91 101* 83 83 79  BUN CREATININE 0.73 0.69 0.73 0.61 1.18  CALCIUM 9.6 9.0 8.9 8.9 9.2  MG 1.7 1.6* 1.6* 1.9 1.8   Liver Function Tests: Recent Labs  Lab 03/25/19 0309 03/26/19 0301 03/27/19 0326  AST ALT ALKPHOS 59 56 64  BILITOT 1.2 1.4* 1.3*  PROT 6.1* 6.9 6.7  ALBUMIN 3.3* 3.6 3.4*   No results for input(s): LIPASE, AMYLASE in the last 168 hours. No results for input(s): AMMONIA in the last 168 hours. CBC: Recent Labs  Lab 03/27/19 0326 03/28/19 0215 03/29/19 0227 03/30/19 0438 03/31/19 0432  WBC 7.2 6.5 5.6 4.8 5.5  HGB 14.9 14.5 14.3 14.1 14.1  HCT  47.7 45.1 45.0 44.1 43.6  MCV 101.5* 99.3 99.8 98.2 98.6  PLT 141* 128* 144* 148* 160   Cardiac Enzymes: No results for input(s): CKTOTAL, CKMB, CKMBINDEX, TROPONINI in the last 168 hours. BNP: Invalid input(s): POCBNP CBG: Recent Labs  Lab 03/30/19 0740  GLUCAP 127*   D-Dimer No results for input(s): DDIMER in the last 72 hours. Hgb A1c No results for input(s): HGBA1C in the last 72 hours. Lipid Profile No results for input(s): CHOL, HDL, LDLCALC, TRIG, CHOLHDL, LDLDIRECT in the last 72 hours. Thyroid function studies No results for input(s): TSH, T4TOTAL, T3FREE, THYROIDAB in the last 72 hours.  Invalid input(s): FREET3 Anemia work up No results for input(s): VITAMINB12, FOLATE, FERRITIN, TIBC, IRON, RETICCTPCT in the last 72 hours. Urinalysis No results found for: COLORURINE, APPEARANCEUR, LABSPEC, PHURINE, GLUCOSEU, HGBUR, BILIRUBINUR, KETONESUR, PROTEINUR, UROBILINOGEN, NITRITE, LEUKOCYTESUR Sepsis Labs Invalid input(s): PROCALCITONIN,  WBC,  LACTICIDVEN Microbiology No results found for this or any previous visit (from the past 240 hour(s)).   Time coordinating discharge:  I have spent 35 minutes face to face with the patient and on the ward discussing the patients care, assessment, plan and disposition with other care givers. >50% of the time was devoted counseling the patient about the risks and benefits of treatment/Discharge disposition and coordinating care.   SIGNED:   Dimple Nanas, MD  [facility] 03/31/2019, 2:10 PM   If 7PM-7AM, please contact night-coverage [email or web address]

## 2019-03-31 NOTE — Progress Notes (Signed)
Physical Therapy Treatment Patient Details Name: Derrick [name] MRN: [id number] DOB: [birth year] Today's Date: 03/31/2019    History of Present Illness 68 y.o. male with medical history significant for COPD, pulmonary hypertension, heavy tobacco user, diastolic CHF and rheumatoid arthritis, presented to the [facility] with sudden onset difficulty breathing.  Noted to be hypoxic in the ER requiring 6 L nasal cannula.  COVID-negative.  Chest x-ray showed large right-sided pneumothorax requiring chest tube placement. Chest tube dc'd evening of 8/17, re-inserted 03/25/19 and removed again on 03/28/19    PT Comments    Pt progressing well from mobility stand point. Pt cont to desat to SpO2 in low 80s on 6Lo2 via [place] during ambulation. Pt functioning at supervision level and was able to amb without AD today without episode of LOB. Acute PT to cont to follow.    Follow Up Recommendations  Other (comment)     Equipment Recommendations  None recommended by PT    Recommendations for Other Services       Precautions / Restrictions Precautions Precautions: Other (comment) Precaution Comments: watch O2 Restrictions Weight Bearing Restrictions: No    Mobility  Bed Mobility Overal bed mobility: Independent             General bed mobility comments: hob elevated, no difficulty  Transfers Overall transfer level: Modified independent Equipment used: None Transfers: Sit to/from Stand Sit to Stand: Modified independent (Device/Increase time)         General transfer comment: no difficulty, pushed up from bed  Ambulation/Gait Ambulation/Gait assistance: Min guard Gait Distance (Feet): 150 Feet Assistive device: None Gait Pattern/deviations: Step-through pattern;Decreased stride length Gait velocity: decreased Gait velocity interpretation: <1.8 ft/sec, indicate of risk for recurrent falls General Gait Details: pt in initially wanted to use RW however pt steady and was encouraged to trial  without, pt with decresaed step length and guarded posture   Stairs             Wheelchair Mobility    Modified Rankin (Stroke Patients Only)       Balance Overall balance assessment: Mild deficits observed, not formally tested                                          Cognition Arousal/Alertness: Awake/alert Behavior During Therapy: WFL for tasks assessed/performed Overall Cognitive Status: Within Functional Limits for tasks assessed                                        Exercises      General Comments General comments (skin integrity, edema, etc.): SPO2 at 81% on 6LO2 via [place], quickly recovered to lows 90s upon returning to sit      Pertinent Vitals/Pain Pain Assessment: No/denies pain    Home Living                      Prior Function            PT Goals (current goals can now be found in the care plan section) Progress towards PT goals: Progressing toward goals    Frequency    Min 3X/week      PT Plan Current plan remains appropriate    Co-evaluation  AM-PAC PT "6 Clicks" Mobility   Outcome Measure  Help needed turning from your back to your side while in a flat bed without using bedrails?: None Help needed moving from lying on your back to sitting on the side of a flat bed without using bedrails?: None Help needed moving to and from a bed to a chair (including a wheelchair)?: None Help needed standing up from a chair using your arms (e.g., wheelchair or bedside chair)?: None Help needed to walk in hospital room?: None Help needed climbing 3-5 steps with a railing? : A Little 6 Click Score: 23    End of Session Equipment Utilized During Treatment: Oxygen;Gait belt Activity Tolerance: Patient tolerated treatment well Patient left: in chair;with call bell/phone within reach Nurse Communication: Mobility status PT Visit Diagnosis: Unsteadiness on feet (R26.81);Other abnormalities  of gait and mobility (R26.89)     Time: 7672-0947 PT Time Calculation (min) (ACUTE ONLY): 21 min  Charges:  $Gait Training: 8-22 mins                     Lewis Shock, PT, DPT Acute Rehabilitation Services Pager #: [phone] Office #: [phone]    Iona Hansen 03/31/2019, 9:35 AM

## 2019-03-31 NOTE — Progress Notes (Signed)
Patient ID: Derrick [name], male   DOB: 1951/02/06, 68 y.o.   MRN: [id number]  This NP visited patient at the bedside as a follow up to last week's  GOCs meeting.  Visited at bedside with patient and his sister/[name].   Discussed the importance of documentation of healthcare power of attorney and advanced directives.   Continued conversation regarding current medical situation.    We discussed the patient's high risk for decompensation and the importance of continued conversation with his family and the medical providers regarding overall plan of care and treatment options, ensuring decisions are within the context of the patient's values and goals of care.  Recommend outpatient community-based palliative care to follow on discharge.  Questions and concerns addressed   Discussed with Percell Locus LCSW  Total time spent on the unit was 15 minutes  Greater than 50% of the time was spent in counseling and coordination of care  Wadie Lessen NP  Palliative Medicine Team Team Phone # [phone] Pager [phone]

## 2019-04-02 ENCOUNTER — Encounter: Payer: Self-pay | Admitting: *Deleted

## 2019-04-02 NOTE — Progress Notes (Unsigned)
Derrick [name] was in the hospital recently for a large left sided pneumothorax which required the insertion of two separate chest tubes before resolution.  His HHN, [name] , from [facility] called in regards to his chest tube suture and it's removal.  No return visit was made or is necessary in our office.  I instructed her to remove the remaining suture the week of 04/07/19 and she agrees.

## 2019-04-08 DIAGNOSIS — J939 Pneumothorax, unspecified: Secondary | ICD-10-CM

## 2019-04-08 HISTORY — DX: Pneumothorax, unspecified: J93.9

## 2019-04-09 ENCOUNTER — Inpatient Hospital Stay (HOSPITAL_COMMUNITY)
Admission: EM | Admit: 2019-04-09 | Discharge: 2019-04-19 | DRG: 163 | Disposition: A | Discharge status: Home Health | Payer: Medicare HMO | Attending: Internal Medicine | Admitting: Internal Medicine

## 2019-04-09 ENCOUNTER — Other Ambulatory Visit: Payer: Self-pay

## 2019-04-09 ENCOUNTER — Emergency Department (HOSPITAL_COMMUNITY): Payer: Medicare HMO

## 2019-04-09 ENCOUNTER — Encounter (HOSPITAL_COMMUNITY): Payer: Self-pay | Admitting: *Deleted

## 2019-04-09 DIAGNOSIS — M069 Rheumatoid arthritis, unspecified: Secondary | ICD-10-CM | POA: Diagnosis present

## 2019-04-09 DIAGNOSIS — I2510 Atherosclerotic heart disease of native coronary artery without angina pectoris: Secondary | ICD-10-CM | POA: Diagnosis present

## 2019-04-09 DIAGNOSIS — Z20828 Contact with and (suspected) exposure to other viral communicable diseases: Secondary | ICD-10-CM | POA: Diagnosis present

## 2019-04-09 DIAGNOSIS — Z9689 Presence of other specified functional implants: Secondary | ICD-10-CM | POA: Diagnosis not present

## 2019-04-09 DIAGNOSIS — Z823 Family history of stroke: Secondary | ICD-10-CM

## 2019-04-09 DIAGNOSIS — J9383 Other pneumothorax: Secondary | ICD-10-CM | POA: Diagnosis present

## 2019-04-09 DIAGNOSIS — J9621 Acute and chronic respiratory failure with hypoxia: Secondary | ICD-10-CM | POA: Diagnosis present

## 2019-04-09 DIAGNOSIS — I272 Pulmonary hypertension, unspecified: Secondary | ICD-10-CM | POA: Diagnosis present

## 2019-04-09 DIAGNOSIS — J439 Emphysema, unspecified: Secondary | ICD-10-CM | POA: Diagnosis present

## 2019-04-09 DIAGNOSIS — I5032 Chronic diastolic (congestive) heart failure: Secondary | ICD-10-CM | POA: Diagnosis present

## 2019-04-09 DIAGNOSIS — Z833 Family history of diabetes mellitus: Secondary | ICD-10-CM | POA: Diagnosis not present

## 2019-04-09 DIAGNOSIS — Z66 Do not resuscitate: Secondary | ICD-10-CM | POA: Diagnosis present

## 2019-04-09 DIAGNOSIS — Z8249 Family history of ischemic heart disease and other diseases of the circulatory system: Secondary | ICD-10-CM | POA: Diagnosis not present

## 2019-04-09 DIAGNOSIS — R64 Cachexia: Secondary | ICD-10-CM | POA: Diagnosis present

## 2019-04-09 DIAGNOSIS — R531 Weakness: Secondary | ICD-10-CM

## 2019-04-09 DIAGNOSIS — D649 Anemia, unspecified: Secondary | ICD-10-CM | POA: Diagnosis present

## 2019-04-09 DIAGNOSIS — I082 Rheumatic disorders of both aortic and tricuspid valves: Secondary | ICD-10-CM | POA: Diagnosis present

## 2019-04-09 DIAGNOSIS — J939 Pneumothorax, unspecified: Secondary | ICD-10-CM | POA: Diagnosis present

## 2019-04-09 DIAGNOSIS — Z681 Body mass index (BMI) 19 or less, adult: Secondary | ICD-10-CM

## 2019-04-09 DIAGNOSIS — E43 Unspecified severe protein-calorie malnutrition: Secondary | ICD-10-CM | POA: Diagnosis present

## 2019-04-09 DIAGNOSIS — R000 Tachycardia, unspecified: Secondary | ICD-10-CM | POA: Diagnosis present

## 2019-04-09 DIAGNOSIS — J9311 Primary spontaneous pneumothorax: Secondary | ICD-10-CM | POA: Diagnosis not present

## 2019-04-09 DIAGNOSIS — F17220 Nicotine dependence, chewing tobacco, uncomplicated: Secondary | ICD-10-CM | POA: Diagnosis present

## 2019-04-09 DIAGNOSIS — Z79899 Other long term (current) drug therapy: Secondary | ICD-10-CM

## 2019-04-09 DIAGNOSIS — Z938 Other artificial opening status: Secondary | ICD-10-CM | POA: Diagnosis not present

## 2019-04-09 DIAGNOSIS — J9601 Acute respiratory failure with hypoxia: Secondary | ICD-10-CM | POA: Diagnosis present

## 2019-04-09 DIAGNOSIS — I110 Hypertensive heart disease with heart failure: Secondary | ICD-10-CM | POA: Diagnosis present

## 2019-04-09 DIAGNOSIS — J9382 Other air leak: Secondary | ICD-10-CM | POA: Diagnosis present

## 2019-04-09 DIAGNOSIS — Z9981 Dependence on supplemental oxygen: Secondary | ICD-10-CM | POA: Diagnosis not present

## 2019-04-09 DIAGNOSIS — I509 Heart failure, unspecified: Secondary | ICD-10-CM

## 2019-04-09 DIAGNOSIS — R0603 Acute respiratory distress: Secondary | ICD-10-CM | POA: Diagnosis present

## 2019-04-09 DIAGNOSIS — J449 Chronic obstructive pulmonary disease, unspecified: Secondary | ICD-10-CM | POA: Diagnosis present

## 2019-04-09 HISTORY — PX: CHEST TUBE INSERTION: SHX231

## 2019-04-09 LAB — BASIC METABOLIC PANEL
Anion gap: 6 (ref 5–15)
BUN: 17 mg/dL (ref 8–23)
CO2: 36 mmol/L — ABNORMAL HIGH (ref 22–32)
Calcium: 9.1 mg/dL (ref 8.9–10.3)
Chloride: 97 mmol/L — ABNORMAL LOW (ref 98–111)
Creatinine, Ser: 0.73 mg/dL (ref 0.61–1.24)
GFR calc Af Amer: >60 mL/min (ref >60)
GFR calc non Af Amer: >60 mL/min (ref >60)
Glucose, Bld: 99 mg/dL (ref 70–99)
Potassium: 4.7 mmol/L (ref 3.5–5.1)
Sodium: 139 mmol/L (ref 135–145)

## 2019-04-09 LAB — CBC
HCT: 43.4 % (ref 39.0–52.0)
Hemoglobin: 13.2 g/dL (ref 13.0–17.0)
MCH: 31.5 pg (ref 26.0–34.0)
MCHC: 30.4 g/dL (ref 30.0–36.0)
MCV: 103.6 fL — ABNORMAL HIGH (ref 80.0–100.0)
Platelets: 212 10*3/uL (ref 150–400)
RBC: 4.19 MIL/uL — ABNORMAL LOW (ref 4.22–5.81)
RDW: 11.9 % (ref 11.5–15.5)
WBC: 5.4 10*3/uL (ref 4.0–10.5)
nRBC: 0 % (ref 0.0–0.2)

## 2019-04-09 LAB — SARS CORONAVIRUS 2 BY RT PCR (HOSPITAL ORDER, PERFORMED IN ~~LOC~~ HOSPITAL LAB): SARS Coronavirus 2: NEGATIVE

## 2019-04-09 MED ORDER — DOCUSATE SODIUM 100 MG PO CAPS
100.0000 mg | ORAL_CAPSULE | Freq: Two times a day (BID) | ORAL | Status: DC
  Administered 2019-04-09 – 2019-04-18 (×15): 100 mg via ORAL
  Filled 2019-04-09 (×18): qty 1

## 2019-04-09 MED ORDER — ONDANSETRON HCL 4 MG/2ML IJ SOLN: 4.0000 mg | Freq: Four times a day (QID) | INTRAMUSCULAR | Status: DC | PRN

## 2019-04-09 MED ORDER — NICOTINE 14 MG/24HR TD PT24
14.0000 mg | MEDICATED_PATCH | Freq: Every day | TRANSDERMAL | Status: DC
  Filled 2019-04-09: qty 1

## 2019-04-09 MED ORDER — ACETAMINOPHEN 325 MG PO TABS
650.0000 mg | ORAL_TABLET | Freq: Four times a day (QID) | ORAL | Status: DC
  Administered 2019-04-09: 650 mg via ORAL
  Filled 2019-04-09 (×2): qty 2

## 2019-04-09 MED ORDER — ONDANSETRON HCL 4 MG PO TABS: 4.0000 mg | ORAL_TABLET | Freq: Four times a day (QID) | ORAL | Status: DC | PRN

## 2019-04-09 MED ORDER — LISINOPRIL 2.5 MG PO TABS
2.5000 mg | ORAL_TABLET | Freq: Every day | ORAL | Status: DC
  Administered 2019-04-09 – 2019-04-19 (×11): 2.5 mg via ORAL
  Filled 2019-04-09 (×11): qty 1

## 2019-04-09 MED ORDER — SODIUM CHLORIDE 0.9% FLUSH
3.0000 mL | Freq: Two times a day (BID) | INTRAVENOUS | Status: DC
  Administered 2019-04-09 – 2019-04-18 (×19): 3 mL via INTRAVENOUS

## 2019-04-09 MED ORDER — FENTANYL CITRATE (PF) 100 MCG/2ML IJ SOLN
50.0000 ug | Freq: Once | INTRAMUSCULAR | Status: AC
  Administered 2019-04-09: 10:00:00 50 ug via INTRAVENOUS
  Filled 2019-04-09: qty 2

## 2019-04-09 MED ORDER — CARVEDILOL 3.125 MG PO TABS
3.1250 mg | ORAL_TABLET | Freq: Two times a day (BID) | ORAL | Status: DC
  Administered 2019-04-09 – 2019-04-19 (×20): 3.125 mg via ORAL
  Filled 2019-04-09 (×20): qty 1

## 2019-04-09 MED ORDER — LIDOCAINE HCL (PF) 1 % IJ SOLN
INTRAMUSCULAR | Status: AC
  Filled 2019-04-09: qty 30

## 2019-04-09 MED ORDER — IPRATROPIUM-ALBUTEROL 0.5-2.5 (3) MG/3ML IN SOLN: 3.0000 mL | RESPIRATORY_TRACT | Status: DC | PRN

## 2019-04-09 MED ORDER — ACETAMINOPHEN 650 MG RE SUPP: 650.0000 mg | Freq: Four times a day (QID) | RECTAL | Status: DC

## 2019-04-09 MED ORDER — FLUTICASONE PROPIONATE 50 MCG/ACT NA SUSP
2.0000 | Freq: Every day | NASAL | Status: DC
  Administered 2019-04-09 – 2019-04-18 (×10): 2 via NASAL
  Filled 2019-04-09: qty 16

## 2019-04-09 MED ORDER — LIDOCAINE-EPINEPHRINE (PF) 2 %-1:200000 IJ SOLN
INTRAMUSCULAR | Status: AC
  Administered 2019-04-09: 09:00:00
  Filled 2019-04-09: qty 20

## 2019-04-09 MED ORDER — FUROSEMIDE 40 MG PO TABS
40.0000 mg | ORAL_TABLET | Freq: Every day | ORAL | Status: DC
  Administered 2019-04-10 – 2019-04-19 (×10): 40 mg via ORAL
  Filled 2019-04-09 (×10): qty 1

## 2019-04-09 MED ORDER — SODIUM CHLORIDE 0.9% FLUSH: 3.0000 mL | INTRAVENOUS | Status: DC | PRN

## 2019-04-09 MED ORDER — SODIUM CHLORIDE 0.9 % IV SOLN: 250.0000 mL | INTRAVENOUS | Status: DC | PRN

## 2019-04-09 MED ORDER — OXYCODONE HCL 5 MG PO TABS
5.0000 mg | ORAL_TABLET | Freq: Four times a day (QID) | ORAL | Status: DC | PRN
  Administered 2019-04-09 – 2019-04-10 (×4): 5 mg via ORAL
  Filled 2019-04-09 (×4): qty 1

## 2019-04-09 MED ORDER — METHYLPREDNISOLONE SODIUM SUCC 125 MG IJ SOLR
125.0000 mg | Freq: Once | INTRAMUSCULAR | Status: DC
  Filled 2019-04-09: qty 2

## 2019-04-09 MED ORDER — ALBUTEROL SULFATE HFA 108 (90 BASE) MCG/ACT IN AERS
2.0000 | INHALATION_SPRAY | RESPIRATORY_TRACT | Status: AC
  Administered 2019-04-09: 09:00:00 2 via RESPIRATORY_TRACT
  Filled 2019-04-09: qty 6.7

## 2019-04-09 MED ORDER — POTASSIUM CHLORIDE CRYS ER 20 MEQ PO TBCR
20.0000 meq | EXTENDED_RELEASE_TABLET | Freq: Every day | ORAL | Status: DC
  Administered 2019-04-10 – 2019-04-19 (×9): 20 meq via ORAL
  Filled 2019-04-09 (×9): qty 1

## 2019-04-09 NOTE — H&P (Signed)
History and Physical  Derrick [name]:[id number] DOB: April 24, 1951 DOA: 04/09/2019  Referring physician: Hyacinth Meeker MD  PCP: Gareth Morgan, MD   Chief Complaint: SOB  HPI: Derrick [name] is a 68 y.o. male continuous oxygen dependent (6L [place]) with severe emphysema/COPD with history of recurrent spontaneous right-sided pneumothorax who was recently discharged 03/31/2019 after being hospitalized for spontaneous pneumothorax on the right side requiring chest tube placement on 2 occurrences during that admission.  He was discharged after having been stabilized and no recurrent pneumothorax for 48 hours.  He was seen by CT surgery and the recommendation was that if he had a recurrence of pneumothorax he would likely need a VATS procedure at that point.  Unfortunately he return to emergency department today with recurrent symptoms of shortness of breath and chest pain and found to have a recurrent pneumothorax on the right side.  He has a heavy tobacco use history and has diastolic heart failure.  His main complaint is shortness of breath.  He had a chest tube placed and immediately had relief of symptoms.  The patient is DNR at this point.  He does request operative management of his recurrent spontaneous pneumothorax disease.  He initially was hypoxic on arrival with oxygen in the low 70% range.  It has improved after treatment to 90%.  He remains short of breath however he has had significant improvement after chest tube placement.  The ED physician contacted the CT surgery team at [facility] who agreed to see him when he arrives at [facility] for consideration of VATS procedure.  Dr. Cliffton Asters will be consulting.  Review of Systems: All systems reviewed and apart from history of presenting illness, are negative.  Past Medical History:  Diagnosis Date  . Allergic rhinitis due to pollen 09/08/2009  . CHF (congestive heart failure) (HCC)   . COPD (chronic obstructive pulmonary disease) (HCC) 03/14/2013  .  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 Family history of diabetes mellitus 09/08/2009   father  . Family history of stroke (cerebrovascular) 09/08/2009   father  . Nicotine dependence, chewing tobacco, uncomplicated 08/07/1968  . Other disturbances of skin sensation 11/08/2012  . Perennial allergic rhinitis with seasonal variation 11/08/2012  . RA (rheumatoid arthritis) (HCC) 09/08/2009   Past Surgical History:  Procedure Laterality Date  . DENTAL SURGERY     Social History:  reports that he has quit smoking. His smoking use included cigarettes. His smokeless tobacco use includes chew. He reports that he does not drink alcohol or use drugs.  No Known Allergies  Family History  Problem Relation Age of Onset  . Hypertension Mother   . CVA Father   . Heart attack Brother     Prior to Admission medications   Medication Sig Start Date End Date Taking? Authorizing Provider  albuterol (PROVENTIL HFA;VENTOLIN HFA) 108 (90 Base) MCG/ACT inhaler Inhale 1-2 puffs into the lungs every 6 (six) hours as needed for wheezing or shortness of breath. 10/10/18  Yes Emokpae, Courage, MD  albuterol (PROVENTIL) (2.5 MG/3ML) 0.083% nebulizer solution Take 3 mLs (2.5 mg total) by nebulization every 4 (four) hours as needed for wheezing or shortness of breath. 10/10/18  Yes Emokpae, Courage, MD  carvedilol (COREG) 3.125 MG tablet Take 1 tablet (3.125 mg total) by mouth 2 (two) times daily. 10/10/18 04/09/19 Yes Emokpae, Courage, MD  [employer] VITAMIN B12 1000 MCG tablet Take 1,000 mcg by mouth daily.  05/29/16  Yes [provider]  fluticasone (FLONASE) 50 MCG/ACT nasal spray Place 1 spray into both nostrils daily. Patient taking differently: Place 2 sprays into both nostrils daily.  10/10/18  Yes Emokpae, Courage, MD  furosemide (LASIX) 40 MG tablet 1 (40 mg) Tab po qam and half- Tab (20 mg) qpm Patient taking differently: Take 40 mg by mouth daily.  10/10/18  Yes Emokpae, Courage, MD   lisinopril (PRINIVIL,ZESTRIL) 2.5 MG tablet Take 1 tablet (2.5 mg total) by mouth daily. 10/10/18 04/09/19 Yes Emokpae, Courage, MD  potassium chloride SA (KLOR-CON M20) 20 MEQ tablet Take 1 tablet (20 mEq total) by mouth daily. 10/10/18  Yes Roxan Hockey, MD   Physical Exam: Vitals:   04/09/19 1000 04/09/19 1030 04/09/19 1032 04/09/19 1100  BP: 132/69 128/72  137/83  Pulse: (!) 58 61  (!) 58  Resp: 13 18  18   Temp:      TempSrc:      SpO2: 100% 100% 100% 100%  Weight:      Height:         General exam: emaciated, chronically ill appearing male, lying comfortably supine on the gurney in no obvious distress.  Head, eyes and ENT: Nontraumatic and normocephalic. Pupils equally reacting to light and accommodation. Oral mucosa moist.  Neck: Supple. No JVD, carotid bruit or thyromegaly.  Lymphatics: No lymphadenopathy.  Respiratory system: tachypneic. Diffuse wheezing heard bilateral. Right chest tube in place.   Cardiovascular system: S1 and S2 heard, RRR. No JVD, murmurs, gallops, clicks or pedal edema.  Gastrointestinal system: Abdomen is nondistended, soft and nontender. Normal bowel sounds heard. No organomegaly or masses appreciated.  Central nervous system: Alert and oriented. No focal neurological deficits.  Extremities: Symmetric 5 x 5 power. Peripheral pulses symmetrically felt.   Skin: No rashes or acute findings.  Musculoskeletal system: Negative exam.  Psychiatry: Pleasant and cooperative.  Labs on Admission:  Basic Metabolic Panel: Recent Labs  Lab 04/09/19 0856  NA 139  K 4.7  CL 97*  CO2 36*  GLUCOSE 99  BUN 17  CREATININE 0.73  CALCIUM 9.1   Liver Function Tests: No results for input(s): AST, ALT, ALKPHOS, BILITOT, PROT, ALBUMIN in the last 168 hours. No results for input(s): LIPASE, AMYLASE in the last 168 hours. No results for input(s): AMMONIA in the last 168 hours. CBC: Recent Labs  Lab 04/09/19 0856  WBC 5.4  HGB 13.2  HCT 43.4  MCV  103.6*  PLT 212   Cardiac Enzymes: No results for input(s): CKTOTAL, CKMB, CKMBINDEX, TROPONINI in the last 168 hours.  BNP (last 3 results) No results for input(s): PROBNP in the last 8760 hours. CBG: No results for input(s): GLUCAP in the last 168 hours.  Radiological Exams on Admission: Dg Chest Port 1 View  Result Date: 04/09/2019 CLINICAL DATA:  Chest tube placement EXAM: PORTABLE CHEST 1 VIEW COMPARISON:  Earlier today at 0840 hours FINDINGS: 955 hours. Midline trachea. Mild cardiomegaly. No pleural fluid. Placement of a right-sided small bore chest tube. Re-expansion of the right lung, without residual pneumothorax. No lobar consolidation. Underlying hyperinflation consistent with COPD. Subcutaneous emphysema about the right chest. IMPRESSION: Placement right-sided chest tube, with resolution of right-sided pneumothorax. Electronically Signed   By: Abigail Miyamoto M.D.   On: 04/09/2019 10:29   Dg Chest Port 1 View  Result Date: 04/09/2019 CLINICAL DATA:  Decreased breath sounds EXAM: PORTABLE CHEST 1 VIEW COMPARISON:  Multiple priors, most recent March 30, 2019 FINDINGS: There is a recurrence moderate right-sided pneumothorax. No shift of the mediastinal  structures seen. The cardiomediastinal silhouette is unchanged from prior. The left lung is clear. IMPRESSION: Recurrence of moderate right pneumothorax. Critical Value/emergent results were called by telephone at the time of interpretation on 04/09/2019 at 9:08 am to Dr. Eber HongBRIAN MILLER , who verbally acknowledged these results. Electronically Signed   By: Jonna ClarkBindu  Avutu M.D.   On: 04/09/2019 09:08    EKG: personally reviewed. NSR with PVCs  Assessment/Plan Principal Problem:   Pneumothorax on right Active Problems:   Pulmonary HTN (HCC)   Acute respiratory failure with hypoxia (HCC)   Spontaneous pneumothorax   Protein-calorie malnutrition, severe   Chest tube in place   Status post chest tube placement (HCC)   Weakness generalized    Nicotine dependence, chewing tobacco, uncomplicated   RA (rheumatoid arthritis) (HCC)   CHF (congestive heart failure) (HCC)   COPD (chronic obstructive pulmonary disease) (HCC)   1. Recurrent right pnemothorax - Pt is s/p chest tube placement in repeat chest x-ray confirms resolution of the pneumothorax.  [facility] to be seen by the CT surgery service as they had recommended that he have a VATS procedure done if he has a recurrence.  The patient is agreeable to have the procedure done at this time. 2. Advanced severe COPD- duo nebs ordered.  Unfortunately he remains high risk for recurrent pneumothorax and likely will need a lung transplant.  He continues to use tobacco and was strongly advised to stop. 3. Tobacco abuse-patient has been ordered to have nicotine patch and counseled on cessation. 4. Acute respiratory failure with hypoxia-secondary to spontaneous pneumothorax and severe COPD-treatment as noted above. 5. Chronic respiratory failure- patient will be continued on supplemental oxygen. 6. Severe protein calorie malnutrition- consult dietitian and provide supplements as appropriate. 7. DNR/DNI - confirmed by patient.    DVT Prophylaxis: SCD Code Status: DNR Family Communication:   Disposition Plan: [facility] consultation not available at this facility  Time spent: 63 minutes  Standley Dakinslanford Lesha Jager, MD [facility] How to contact the [facility] Attending or Consulting provider 7A - 7P or covering provider during after hours 7P -7A, for this patient?  1. Check the care team in [facility] and look for a) attending/consulting [facility] provider listed and b) the [facility] team listed 2. Log into [email or web address] and use [place]'s universal password to access. If you do not have the password, please contact the hospital operator. 3. Locate the [facility] provider you are looking for under [facility] and page to a number that you can be  directly reached. 4. If you still have difficulty reaching the provider, please page the [facility] (Director on Call) for the Hospitalists listed on amion for assistance.

## 2019-04-09 NOTE — ED Provider Notes (Signed)
[facility] EMERGENCY DEPARTMENT Provider Note   CSN: [id number] Arrival date & time: 04/09/19  [id number]     History   Chief Complaint Chief Complaint  Patient presents with  . Shortness of Breath    HPI Derrick [name] is a 68 y.o. male.     HPI  This patient is an ill-appearing 68 year old male, he has a known history of COPD, pulmonary hypertension, he is a heavy tobacco user, also known to have diastolic heart failure.  He had presented approximately 2 weeks ago with shortness of breath, right-sided pneumothorax which was recurrent was diagnosed.  There was tension, a chest tube was placed, ultimately it was removed on 21 August and the patient was discharged on the 24th.  It was recommended that the patient have CT surgical intervention if this happened again in the future.  He was stable for 48 hours after removal of the tube and was discharged home.  He is on 6 L by nasal cannula chronically.  This morning the patient reports having acute onset of severe shortness of breath.  He was found to have oxygen in the low 70% range.  He was placed on a nonrebreather which brought this up to 90% but the patient is still dyspneic.  He has not been using his albuterol inhaler overnight because he stated that it did not feel that bad until this morning.  The patient is a DO NOT RESUSCITATE and DO NOT INTUBATE.  Past Medical History:  Diagnosis Date  . Allergic rhinitis due to pollen 09/08/2009  . CHF (congestive heart failure) (HCC)   . COPD (chronic obstructive pulmonary disease) (HCC) 03/14/2013  . 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 Family history of diabetes mellitus 09/08/2009   father  . Family history of stroke (cerebrovascular) 09/08/2009   father  . Nicotine dependence, chewing tobacco, uncomplicated 08/07/1968  . Other disturbances of skin sensation 11/08/2012  . Perennial allergic rhinitis with seasonal variation 11/08/2012  . RA (rheumatoid  arthritis) (HCC) 09/08/2009    Patient Active Problem List   Diagnosis Date Noted  . Pneumothorax on right 04/09/2019  . Palliative care by specialist   . DNR (do not resuscitate) discussion   . Weakness generalized   . Protein-calorie malnutrition, severe 03/22/2019  . Chest tube in place   . Status post chest tube placement (HCC)   . Spontaneous pneumothorax 03/20/2019  . Acute respiratory failure with hypoxia (HCC) 10/04/2018  . Cellulitis 10/04/2018  . Acute on chronic diastolic CHF (congestive heart failure) (HCC) 10/04/2018  . NSVT (nonsustained ventricular tachycardia) (HCC) 07/08/2017  . Pulmonary HTN (HCC) 07/08/2017  . COPD exacerbation (HCC) 07/06/2017    Past Surgical History:  Procedure Laterality Date  . DENTAL SURGERY          Home Medications    Prior to Admission medications   Medication Sig Start Date End Date Taking? Authorizing Provider  albuterol (PROVENTIL HFA;VENTOLIN HFA) 108 (90 Base) MCG/ACT inhaler Inhale 1-2 puffs into the lungs every 6 (six) hours as needed for wheezing or shortness of breath. 10/10/18  Yes Emokpae, Courage, MD  albuterol (PROVENTIL) (2.5 MG/3ML) 0.083% nebulizer solution Take 3 mLs (2.5 mg total) by nebulization every 4 (four) hours as needed for wheezing or shortness of breath. 10/10/18  Yes Emokpae, Courage, MD  carvedilol (COREG) 3.125 MG tablet Take 1 tablet (3.125 mg total) by mouth 2 (two) times daily. 10/10/18 04/09/19 Yes Emokpae, Courage, MD  [employer] VITAMIN B12 1000 MCG  tablet Take 1,000 mcg by mouth daily.  05/29/16  Yes [provider]  fluticasone (FLONASE) 50 MCG/ACT nasal spray Place 1 spray into both nostrils daily. Patient taking differently: Place 2 sprays into both nostrils daily.  10/10/18  Yes Emokpae, Courage, MD  furosemide (LASIX) 40 MG tablet 1 (40 mg) Tab po qam and half- Tab (20 mg) qpm Patient taking differently: Take 40 mg by mouth daily.  10/10/18  Yes Emokpae, Courage, MD  lisinopril (PRINIVIL,ZESTRIL) 2.5  MG tablet Take 1 tablet (2.5 mg total) by mouth daily. 10/10/18 04/09/19 Yes Emokpae, Courage, MD  potassium chloride SA (KLOR-CON M20) 20 MEQ tablet Take 1 tablet (20 mEq total) by mouth daily. 10/10/18  Yes Shon HaleEmokpae, Courage, MD    Family History Family History  Problem Relation Age of Onset  . Hypertension Mother   . CVA Father   . Heart attack Brother     Social History Social History   Tobacco Use  . Smoking status: Former Smoker    Types: Cigarettes  . Smokeless tobacco: Current User    Types: Chew  . Tobacco comment: has chewed tobacco for 40+ years  Substance Use Topics  . Alcohol use: No  . Drug use: No     Allergies   Patient has no known allergies.   Review of Systems Review of Systems  All other systems reviewed and are negative.    Physical Exam Updated Vital Signs BP 128/72   Pulse 61   Temp 97.7 F (36.5 C) (Oral)   Resp 18   Ht 1.803 m (5\' 11" )   Wt 54.4 kg   SpO2 100%   BMI 16.74 kg/m    Physical Exam Vitals signs and nursing note reviewed.  Constitutional:      General: He is in acute distress.     Appearance: He is well-developed. He is ill-appearing and toxic-appearing.  HENT:     Head: Normocephalic and atraumatic.     Mouth/Throat:     Pharynx: No oropharyngeal exudate.  Eyes:     General: No scleral icterus.       Right eye: No discharge.        Left eye: No discharge.     Conjunctiva/sclera: Conjunctivae normal.     Pupils: Pupils are equal, round, and reactive to light.  Neck:     Musculoskeletal: Normal range of motion and neck supple.     Thyroid: No thyromegaly.     Vascular: No JVD.  Cardiovascular:     Rate and Rhythm: Regular rhythm. Tachycardia present.     Heart sounds: Normal heart sounds. No murmur. No friction rub. No gallop.   Pulmonary:     Effort: Respiratory distress present.     Comments: Decreased to absent breath sounds on the right, wheezing and decreased breath sounds on the left, using accessory muscles  and severe distress Abdominal:     General: Bowel sounds are normal. There is no distension.     Palpations: Abdomen is soft. There is no mass.     Tenderness: There is no abdominal tenderness.  Musculoskeletal: Normal range of motion.        General: No tenderness.  Lymphadenopathy:     Cervical: No cervical adenopathy.  Skin:    General: Skin is warm and dry.     Findings: No erythema or rash.  Neurological:     Mental Status: He is alert.     Coordination: Coordination normal.  Psychiatric:  Behavior: Behavior normal.      ED Treatments / Results  Labs (all labs ordered are listed, but only abnormal results are displayed) Labs Reviewed  CBC - Abnormal; Notable for the following components:      Result Value   RBC 4.19 (*)    MCV 103.6 (*)    All other components within normal limits  BASIC METABOLIC PANEL - Abnormal; Notable for the following components:   Chloride 97 (*)    CO2 36 (*)    All other components within normal limits  SARS CORONAVIRUS 2 (HOSPITAL ORDER, PERFORMED IN [facility])    EKG EKG Interpretation  Date/Time:  Wednesday April 09 2019 08:35:40 EDT Ventricular Rate:  82 PR Interval:    QRS Duration: 109 QT Interval:  394 QTC Calculation: 461 R Axis:   100 Text Interpretation:  Sinus rhythm Ventricular premature complex Right axis deviation Consider left ventricular hypertrophy since last tracing no significant change Confirmed by Eber Hong ([id number]) on 04/09/2019 10:28:40 AM   Radiology Dg Chest Port 1 View  Result Date: 04/09/2019 CLINICAL DATA:  Chest tube placement EXAM: PORTABLE CHEST 1 VIEW COMPARISON:  Earlier today at 0840 hours FINDINGS: 955 hours. Midline trachea. Mild cardiomegaly. No pleural fluid. Placement of a right-sided small bore chest tube. Re-expansion of the right lung, without residual pneumothorax. No lobar consolidation. Underlying hyperinflation consistent with COPD. Subcutaneous emphysema about  the right chest. IMPRESSION: Placement right-sided chest tube, with resolution of right-sided pneumothorax. Electronically Signed   By: Jeronimo Greaves M.D.   On: 04/09/2019 10:29   Dg Chest Port 1 View  Result Date: 04/09/2019 CLINICAL DATA:  Decreased breath sounds EXAM: PORTABLE CHEST 1 VIEW COMPARISON:  Multiple priors, most recent March 30, 2019 FINDINGS: There is a recurrence moderate right-sided pneumothorax. No shift of the mediastinal structures seen. The cardiomediastinal silhouette is unchanged from prior. The left lung is clear. IMPRESSION: Recurrence of moderate right pneumothorax. Critical Value/emergent results were called by telephone at the time of interpretation on 04/09/2019 at 9:08 am to Dr. Eber Hong , who verbally acknowledged these results. Electronically Signed   By: Jonna Clark M.D.   On: 04/09/2019 09:08    Procedures .Critical Care Performed by: Eber Hong, MD Authorized by: Eber Hong, MD   Critical care provider statement:    Critical care time (minutes):  35   Critical care time was exclusive of:  Separately billable procedures and treating other patients and teaching time   Critical care was necessary to treat or prevent imminent or life-threatening deterioration of the following conditions:  Respiratory failure   Critical care was time spent personally by me on the following activities:  Blood draw for specimens, development of treatment plan with patient or surrogate, discussions with consultants, evaluation of patient's response to treatment, examination of patient, obtaining history from patient or surrogate, ordering and performing treatments and interventions, ordering and review of laboratory studies, ordering and review of radiographic studies, pulse oximetry, re-evaluation of patient's condition and review of old charts Comments:       CHEST TUBE INSERTION  Date/Time: 04/09/2019 9:48 AM Performed by: Eber Hong, MD Authorized by: Eber Hong, MD    Consent:    Consent obtained:  Written   Consent given by:  Patient   Risks discussed:  Bleeding, incomplete drainage, nerve damage, pain, infection and damage to surrounding structures   Alternatives discussed:  No treatment and alternative treatment Pre-procedure details:    Skin preparation:  ChloraPrep  Preparation: Patient was prepped and draped in the usual sterile fashion   Anesthesia (see MAR for exact dosages):    Anesthesia method:  Local infiltration   Local anesthetic:  Lidocaine 1% WITH epi Procedure details:    Placement location:  R lateral   Scalpel size:  11   Tube size (Fr):  Minicatheter   Ultrasound guidance: no     Tension pneumothorax: no     Tube connected to:  Water seal   Drainage characteristics:  Air only   Suture material:  2-0 silk   Dressing:  4x4 sterile gauze Post-procedure details:    Post-insertion x-ray findings: tube in good position     Patient tolerance of procedure:  Tolerated well, no immediate complications Comments:         (including critical care time)  Medications Ordered in ED Medications  albuterol (VENTOLIN HFA) 108 (90 Base) MCG/ACT inhaler 2 puff (2 puffs Inhalation Given 04/09/19 0840)  lidocaine-EPINEPHrine (XYLOCAINE W/EPI) 2 %-1:200000 (PF) injection (  Given by Other 04/09/19 0927)  fentaNYL (SUBLIMAZE) injection 50 mcg (50 mcg Intravenous Given 04/09/19 0952)     Initial Impression / Assessment and Plan / ED Course  I have reviewed the triage vital signs and the nursing notes.  Pertinent labs & imaging results that were available during my care of the patient were reviewed by me and considered in my medical decision making (see chart for details).  Clinical Course as of Apr 08 1048  Wed Apr 09, 2019  0907 I discussed the care with the radiologist who has called me about the reading on the x-ray.  There is a recurrent pneumothorax.  I agree with the radiologist based on my interpretation of the x-ray.  This is a  recurrent right-sided mild to moderate size pneumothorax, no signs of tension   [BM]  1019 Repeat xray   [BM]    Clinical Course User Index [BM] Noemi Chapel, MD       This patient is ill-appearing, I suspect he has a recurrent pneumothorax and if not he may have severe COPD.  He may require repeat chest tube, he may require CT surgical intervention.Stat chest x-ray labs, oxygen and bronchodilators have been ordered.  Chest tube inserted without difficulty, follow-up x-ray ordered  After chest tube placement the follow-up x-ray looks good with reexpansion of the lung appropriately.  Will discuss with cardiothoracic surgery.  The patient's oxygen was turned from 15 L down to 10 L, he is much less labored in his breathing.  Per prior notes - pt may need VATS for recurrent pneumothorax  Discussed with physician assistant on-call for CT surgery, they will discussed with Dr. Kipp Brood, surgeon on call and will consult when patient arrives.  Will admit to medicine due to patient severe COPD and need for medical therapy.  D/w Dr. Wynetta Emery who will assist with transfer to [facility] for CT surgery evaluation and admission.   Derrick [name] was evaluated in Emergency Department on 04/09/2019 for the symptoms described in the history of present illness. He was evaluated in the context of the global COVID-19 pandemic, which necessitated consideration that the patient might be at risk for infection with the SARS-CoV-2 virus that causes COVID-19. Institutional protocols and algorithms that pertain to the evaluation of patients at risk for COVID-19 are in a state of rapid change based on information released by regulatory bodies including the CDC and federal and state organizations. These policies and algorithms were followed during the patient's  care in the ED.     Final Clinical Impressions(s) / ED Diagnoses   Final diagnoses:  Pneumothorax on right  COPD, severe [facility])  Respiratory distress     ED Discharge Orders    None       Eber Hong, MD 04/09/19 1050

## 2019-04-09 NOTE — Progress Notes (Signed)
Paged CT MD - Patient arrived on 4E from [facility].

## 2019-04-09 NOTE — ED Triage Notes (Addendum)
Pt brought in by [facility] with c/o SOB that started this morning. EMS reports pt's initial O2 sat was 74% on 6L of O2 via [place] at home. Pt was placed on NRB and sats increased to 98% for EMS. Pt's sats 91% on NRB at time of arrival to ED. Pt had recent right sided pneumothorax. Solu-medrol given by EMS.

## 2019-04-09 NOTE — ED Notes (Signed)
Xray notified of stat portable chest.

## 2019-04-10 ENCOUNTER — Other Ambulatory Visit: Payer: Self-pay

## 2019-04-10 ENCOUNTER — Inpatient Hospital Stay (HOSPITAL_COMMUNITY): Payer: Medicare HMO

## 2019-04-10 ENCOUNTER — Encounter (HOSPITAL_COMMUNITY): Payer: Self-pay | Admitting: General Practice

## 2019-04-10 DIAGNOSIS — J439 Emphysema, unspecified: Secondary | ICD-10-CM

## 2019-04-10 DIAGNOSIS — J9311 Primary spontaneous pneumothorax: Secondary | ICD-10-CM

## 2019-04-10 LAB — CBC
HCT: 41.5 % (ref 39.0–52.0)
Hemoglobin: 13 g/dL (ref 13.0–17.0)
MCH: 31.6 pg (ref 26.0–34.0)
MCHC: 31.3 g/dL (ref 30.0–36.0)
MCV: 100.7 fL — ABNORMAL HIGH (ref 80.0–100.0)
Platelets: 213 10*3/uL (ref 150–400)
RBC: 4.12 MIL/uL — ABNORMAL LOW (ref 4.22–5.81)
RDW: 11.8 % (ref 11.5–15.5)
WBC: 9 10*3/uL (ref 4.0–10.5)
nRBC: 0 % (ref 0.0–0.2)

## 2019-04-10 LAB — BASIC METABOLIC PANEL
Anion gap: 10 (ref 5–15)
BUN: 19 mg/dL (ref 8–23)
CO2: 33 mmol/L — ABNORMAL HIGH (ref 22–32)
Calcium: 8.9 mg/dL (ref 8.9–10.3)
Chloride: 93 mmol/L — ABNORMAL LOW (ref 98–111)
Creatinine, Ser: 0.69 mg/dL (ref 0.61–1.24)
GFR calc Af Amer: >60 mL/min (ref >60)
GFR calc non Af Amer: >60 mL/min (ref >60)
Glucose, Bld: 122 mg/dL — ABNORMAL HIGH (ref 70–99)
Potassium: 5.1 mmol/L (ref 3.5–5.1)
Sodium: 136 mmol/L (ref 135–145)

## 2019-04-10 MED ORDER — ACETAMINOPHEN 325 MG PO TABS
650.0000 mg | ORAL_TABLET | Freq: Four times a day (QID) | ORAL | Status: DC
  Administered 2019-04-10 – 2019-04-19 (×34): 650 mg via ORAL
  Filled 2019-04-10 (×33): qty 2

## 2019-04-10 MED ORDER — SENNOSIDES-DOCUSATE SODIUM 8.6-50 MG PO TABS
2.0000 | ORAL_TABLET | Freq: Every evening | ORAL | Status: DC | PRN
  Filled 2019-04-10: qty 2

## 2019-04-10 MED ORDER — ORAL CARE MOUTH RINSE
15.0000 mL | Freq: Two times a day (BID) | OROMUCOSAL | Status: DC
  Administered 2019-04-10 – 2019-04-18 (×13): 15 mL via OROMUCOSAL

## 2019-04-10 MED ORDER — ENSURE ENLIVE PO LIQD
237.0000 mL | Freq: Three times a day (TID) | ORAL | Status: DC
  Administered 2019-04-10 – 2019-04-19 (×23): 237 mL via ORAL

## 2019-04-10 MED ORDER — ADULT MULTIVITAMIN W/MINERALS CH
1.0000 | ORAL_TABLET | Freq: Every day | ORAL | Status: DC
  Administered 2019-04-10 – 2019-04-19 (×10): 1 via ORAL
  Filled 2019-04-10 (×10): qty 1

## 2019-04-10 MED ORDER — POLYETHYLENE GLYCOL 3350 17 G PO PACK: 17.0000 g | PACK | Freq: Every day | ORAL | Status: DC | PRN

## 2019-04-10 MED ORDER — ACETAMINOPHEN 650 MG RE SUPP
650.0000 mg | Freq: Four times a day (QID) | RECTAL | Status: DC
  Filled 2019-04-10: qty 1

## 2019-04-10 NOTE — Progress Notes (Addendum)
Initial Nutrition Assessment  DOCUMENTATION CODES:   Severe malnutrition in context of chronic illness, Underweight  INTERVENTION:    Ensure Enlive po TID, each supplement provides 350 kcal and 20 grams of protein  Magic cup BID with meals, each supplement provides 290 kcal and 9 grams of protein  MVI daily   NUTRITION DIAGNOSIS:   Severe Malnutrition related to chronic illness(COPD/recurrent R pneumo) as evidenced by severe fat depletion, severe muscle depletion.  GOAL:   Patient will meet greater than or equal to 90% of their needs  MONITOR:   PO intake, Supplement acceptance, Weight trends, Labs, I & O's, Skin  REASON FOR ASSESSMENT:   Other (Comment)(low BMI)    ASSESSMENT:   Patient with PMH significant for severe emphysema/COPD and recurrent spontaneous R sided pneumothorax. Presents this admission with R pneumothorax.   9/2- s/p R chest tube  Spoke with pt at bedside. Denies loss of appetite PTA. States he typically eat three meals daily (B-eggs, oatmeal L- sandwich D- meat, vegetable, grain). He drinks once Ensure daily. Endorses havign some issue with chewing harder foods due to missing teeth. Would like to leave regular diet. Discussed the importance of protein intake for preservation of lean body mass. Pt amenable to Esnure this admission.   Pt reports a UBW of 150 lb and states he has always been of smaller frame. Records indicate pt weighed 140 lb on 11/28/2018 and show a stated weight of 120 lb this admission. Will need to obtain actual weight to assess for weight loss. Severe malnutrition continues.   I/O: +330 ml since admit UOP: 150 ml x 24 hrs Chest tube: 0 ml x 24 hrs    Medications: colace, 40 mg lasix daily, 20 mEq KCl daily  Labs: reviewed    NUTRITION - FOCUSED PHYSICAL EXAM:    Most Recent Value  Orbital Region  Severe depletion  Upper Arm Region  Severe depletion  Thoracic and Lumbar Region  Severe depletion  Buccal Region  Severe  depletion  Temple Region  Severe depletion  Clavicle Bone Region  Severe depletion  Clavicle and Acromion Bone Region  Severe depletion  Scapular Bone Region  Severe depletion  Dorsal Hand  Severe depletion  Patellar Region  Severe depletion  Anterior Thigh Region  Severe depletion  Posterior Calf Region  Severe depletion  Edema (RD Assessment)  None  Hair  Reviewed  Eyes  Reviewed  Mouth  Reviewed  Skin  Reviewed  Nails  Reviewed     Diet Order:   Diet Order            Diet NPO time specified  Diet effective midnight        Diet regular Room service appropriate? Yes; Fluid consistency: Thin  Diet effective now              EDUCATION NEEDS:   Education needs have been addressed  Skin:  Skin Assessment: Reviewed RN Assessment  Last BM:  9/2  Height:   Ht Readings from Last 1 Encounters:  04/09/19 5\' 11"  (1.803 m)    Weight:   Wt Readings from Last 1 Encounters:  04/09/19 54.4 kg    Ideal Body Weight:  78.2 kg  BMI:  Body mass index is 16.74 kg/m.  Estimated Nutritional Needs:   Kcal:  2050-2250 kcal  Protein:  105-120 grams  Fluid:  >/= 2 L/day   Mariana Single RD, LDN Clinical Nutrition Pager # - [phone]

## 2019-04-10 NOTE — Anesthesia Preprocedure Evaluation (Addendum)
Anesthesia Evaluation  Patient identified by MRN, date of birth, ID band Patient awake    Reviewed: Allergy & Precautions, NPO status , Patient's Chart, lab work & pertinent test results  Airway Mallampati: II  TM Distance: >3 FB Neck ROM: Full    Dental  (+) Edentulous Upper, Edentulous Lower   Pulmonary COPD,  COPD inhaler and oxygen dependent, former smoker,    Pulmonary exam normal breath sounds clear to auscultation       Cardiovascular + CAD and +CHF  Normal cardiovascular exam Rhythm:Regular Rate:Normal  ECG: SR, Sinus rhythm Ventricular premature complex Right axis deviation Consider left ventricular hypertrophy  Pulmonary HTN  ECHO: 1. The left ventricle has normal systolic function with an ejection fraction of 60-65%. The cavity size was normal. There is severe asymmetric left ventricular hypertrophy. Left ventricular diastolic Doppler parameters are consistent with impaired  relaxation There is right ventricular volume and pressure overload.  2. The right ventricle has severely reduced systolic function. The cavity was severely enlarged. There is mildly increased right ventricular wall thickness. Right ventricular systolic pressure is moderately elevated with an estimated pressure of 55.8  mmHg.  3. Right atrial size was severely dilated.  4. Small pericardial effusion.  5. The pericardial effusion is circumferential.  6. The mitral valve is normal in structure.  7. The tricuspid valve is normal in structure. Tricuspid valve regurgitation is moderate.  8. The aortic valve is tricuspid Mild thickening of the aortic valve Aortic valve regurgitation is moderate by color flow Doppler. mild stenosis of the aortic valve.  9. There is mild dilatation of the aortic root. 10. Pulmonary hypertension is moderate. 11. The inferior vena cava was dilated in size with <50% respiratory variability.   Neuro/Psych negative  neurological ROS  negative psych ROS   GI/Hepatic negative GI ROS, Neg liver ROS,   Endo/Other  negative endocrine ROS  Renal/GU negative Renal ROS     Musculoskeletal  (+) Arthritis , Rheumatoid disorders,    Abdominal   Peds  Hematology negative hematology ROS (+)   Anesthesia Other Findings AIRLEAK  Reproductive/Obstetrics                            Anesthesia Physical Anesthesia Plan  ASA: IV  Anesthesia Plan: General   Post-op Pain Management:    Induction: Intravenous  PONV Risk Score and Plan: 2 and Ondansetron, Dexamethasone and Treatment may vary due to age or medical condition  Airway Management Planned: Oral ETT  Additional Equipment:   Intra-op Plan:   Post-operative Plan: Extubation in OR and Possible Post-op intubation/ventilation  Informed Consent: I have reviewed the patients History and Physical, chart, labs and discussed the procedure including the risks, benefits and alternatives for the proposed anesthesia with the patient or authorized representative who has indicated his/her understanding and acceptance.   Patient has DNR.  Discussed DNR with patient.     Plan Discussed with: CRNA  Anesthesia Plan Comments:        Anesthesia Quick Evaluation

## 2019-04-10 NOTE — Consult Note (Signed)
[address]       [place] [address]             [phone]                    Derrick [name] [facility] Medical Record #[id number] Date of Birth: 1950/11/24  Referring: No ref. provider found Primary Care: Gareth Morgan, MD Primary Cardiologist: Dina Rich, MD  Chief Complaint:    Chief Complaint  Patient presents with   Shortness of Breath    History of Present Illness:    Derrick [name] 68 y.o. male with history of recurrent right sided pneumothorax presents with another event.  He was recently discharged for this same problem.  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) 11/07/2011   Family history of diabetes mellitus 09/08/2009   father   Family history of stroke (cerebrovascular) 09/08/2009   father   Nicotine dependence, chewing tobacco, uncomplicated 08/07/1968   Other disturbances of skin sensation 11/08/2012   Perennial allergic rhinitis with seasonal variation 11/08/2012   Pneumothorax on right 04/2019   chest tube insertion   RA (rheumatoid arthritis) (HCC) 09/08/2009    Past Surgical History:  Procedure Laterality Date   CHEST TUBE INSERTION Right 04/09/2019   DENTAL SURGERY      Family History  Problem Relation Age of Onset   Hypertension Mother    CVA Father    Heart attack Brother      Social History   Tobacco Use  Smoking Status Former Smoker   Types: Cigarettes  Smokeless Tobacco Current User   Types: Chew  Tobacco Comment   has chewed tobacco for 40+ years    Social History   Substance and Sexual Activity  Alcohol Use No     No Known Allergies  Current Facility-Administered Medications  Medication  Dose Route Frequency Provider Last Rate Last Dose   0.9 %  sodium chloride infusion  250 mL Intravenous PRN Johnson, Clanford L, MD       acetaminophen (TYLENOL) tablet 650 mg  650 mg Oral Q6H Johnson, Clanford L, MD   650 mg at 04/10/19 1051   Or   acetaminophen (TYLENOL) suppository 650 mg  650 mg Rectal Q6H Johnson, Clanford L, MD       carvedilol (COREG) tablet 3.125 mg  3.125 mg Oral BID Johnson, Clanford L, MD   3.125 mg at 04/10/19 1052   docusate sodium (COLACE) capsule 100 mg  100 mg Oral BID Johnson, Clanford L, MD   100 mg at 04/10/19 1059   fluticasone (FLONASE) 50 MCG/ACT nasal spray 2 spray  2 spray Each Nare Daily Johnson, Clanford L, MD   2 spray at 04/10/19 1053   furosemide (LASIX) tablet 40 mg  40 mg Oral Daily Johnson, Clanford L, MD   40 mg at 04/10/19 1052   ipratropium-albuterol (DUONEB) 0.5-2.5 (3) MG/3ML nebulizer solution 3 mL  3 mL Nebulization Q4H PRN Johnson, Clanford L, MD       lisinopril (ZESTRIL) tablet 2.5 mg  2.5 mg Oral Daily Johnson, Clanford L, MD   2.5 mg at 04/10/19 1050   MEDLINE mouth rinse  15  mL Mouth Rinse BID Johnson, Clanford L, MD   15 mL at 04/10/19 1053   ondansetron (ZOFRAN) tablet 4 mg  4 mg Oral Q6H PRN Johnson, Clanford L, MD       Or   ondansetron (ZOFRAN) injection 4 mg  4 mg Intravenous Q6H PRN Johnson, Clanford L, MD       oxyCODONE (Oxy IR/ROXICODONE) immediate release tablet 5 mg  5 mg Oral Q6H PRN Johnson, Clanford L, MD   5 mg at 04/10/19 1046   polyethylene glycol (MIRALAX / GLYCOLAX) packet 17 g  17 g Oral Daily PRN Amin, Ankit Chirag, MD       potassium chloride SA (K-DUR) CR tablet 20 mEq  20 mEq Oral Daily Johnson, Clanford L, MD   20 mEq at 04/10/19 1052   senna-docusate (Senokot-S) tablet 2 tablet  2 tablet Oral QHS PRN Amin, Ankit Chirag, MD       sodium chloride flush (NS) 0.9 % injection 3 mL  3 mL Intravenous Q12H Johnson, Clanford L, MD   3 mL at 04/09/19 2125   sodium chloride flush (NS) 0.9 % injection 3  mL  3 mL Intravenous PRN Johnson, Clanford L, MD        Review of Systems  Constitutional: Negative for chills, fever and weight loss.  Respiratory: Positive for cough and shortness of breath.   Cardiovascular: Negative for chest pain.  Gastrointestinal: Negative.   Musculoskeletal: Negative.   Skin: Negative.     PHYSICAL EXAMINATION: BP 127/75 (BP Location: Left Arm)    Pulse 60    Temp 97.7 F (36.5 C) (Axillary)    Resp 20    Ht 5\' 11"  (1.803 m)    Wt 54.4 kg    SpO2 95%    BMI 16.74 kg/m   Physical Exam  Constitutional: He is oriented to person, place, and time. No distress.  frail  HENT:  Head: Normocephalic.  Eyes: Conjunctivae and EOM are normal.  Neck: Normal range of motion. No tracheal deviation present.  Cardiovascular: Normal rate.  Pulmonary/Chest: Effort normal. No respiratory distress.  No leak on CT  Musculoskeletal: Normal range of motion.  Neurological: He is alert and oriented to person, place, and time.  Skin: Skin is warm and dry. He is not diaphoretic.     Diagnostic Studies & Laboratory data:     Recent Radiology Findings:   Dg Chest 1 View  Result Date: 03/25/2019 CLINICAL DATA:  Shortness of breath.  Recent pneumothorax. EXAM: CHEST  1 VIEW COMPARISON:  Chest x-rays dated 08/17 2020 and 03/20/2019 FINDINGS: The patient has recurrent large right pneumothorax at least 50%. There now appears to be slight shift of the heart mediastinal structures to the left suggesting tension. Extensive emphysematous changes noted on the left. Heart size and vascularity are normal. No acute bone abnormality. IMPRESSION: Recurrent large right pneumothorax with suggestion of tension. Critical Value/emergent results were called by telephone at the time of interpretation on 03/25/2019 at 6:01 pm to Tyrone Continuecare At UniversityKeisha, [place]RN , who verbally acknowledged these results. Electronically Signed   By: Francene BoyersJames  Maxwell M.D.   On: 03/25/2019 18:04   Ct Chest Wo Contrast  Result Date:  03/20/2019 CLINICAL DATA:  Follow-up for resolution of pneumothorax. EXAM: CT CHEST WITHOUT CONTRAST TECHNIQUE: Multidetector CT imaging of the chest was performed following the standard protocol without IV contrast. COMPARISON:  Radiographs earlier this day. FINDINGS: Cardiovascular: Moderate aortic atherosclerosis. Aortic tortuosity with ectasia of the descending thoracic aorta, 3.8 cm. Dilated main  pulmonary artery at 4 cm. Coronary artery calcifications. Heart is normal in size. No pericardial effusion. Mediastinum/Nodes: No enlarged mediastinal lymph nodes. Limited assessment for hilar adenopathy given lack of IV contrast. The esophagus is decompressed. No visualized thyroid nodule. Lungs/Pleura: Right pigtail catheter in place, tip along the major fissure. Advanced emphysema with large apical bulla, air-filled structure in the right lung apex felt to represent a large bulla rather than residual pneumothorax. Small partially loculated residual pneumothorax noted anterior inferiorly anterior to the right middle lobe. Right lower lobe opacity favoring atelectasis. Large bulla also noted in the left upper lobe. Subsegmental atelectasis in the left upper lobe. No pleural fluid or pulmonary mass. Upper Abdomen: No acute findings. Atherosclerosis of upper abdominal vasculature. Musculoskeletal: There are no acute or suspicious osseous abnormalities. IMPRESSION: 1. Right pigtail catheter in place, tip along the major fissure. Small partially loculated pneumothorax anterior inferiorly to the right middle lobe. 2. Advanced emphysema with large apical bulla. Air-filled structure in the right lung apex felt to represent a large bulla rather than residual pneumothorax. Right lower lobe opacity favoring atelectasis. 3. Aortic atherosclerosis, ectatic descending thoracic aorta at 3.7 cm. Coronary artery calcifications. 4. Dilated main pulmonary artery suggesting pulmonary arterial hypertension. Aortic Atherosclerosis  (ICD10-I70.0) and Emphysema (ICD10-J43.9). Electronically Signed   By: Narda Rutherford M.D.   On: 03/20/2019 18:37   Portable Chest 1 View  Result Date: 04/10/2019 CLINICAL DATA:  Follow-up pneumothorax EXAM: PORTABLE CHEST 1 VIEW COMPARISON:  04/09/2019 FINDINGS: Cardiac shadow is stable. Aortic calcifications are again seen. The lungs are hyperinflated. The left lung remains clear with emphysematous changes. Right-sided chest tube is again seen in the apex. No definitive pneumothorax is seen. Emphysematous changes are again noted. IMPRESSION: Changes of COPD.  No recurrent pneumothorax is noted. Electronically Signed   By: Alcide Clever M.D.   On: 04/10/2019 08:34   Dg Chest Port 1 View  Result Date: 04/09/2019 CLINICAL DATA:  Chest tube placement EXAM: PORTABLE CHEST 1 VIEW COMPARISON:  Earlier today at 0840 hours FINDINGS: 955 hours. Midline trachea. Mild cardiomegaly. No pleural fluid. Placement of a right-sided small bore chest tube. Re-expansion of the right lung, without residual pneumothorax. No lobar consolidation. Underlying hyperinflation consistent with COPD. Subcutaneous emphysema about the right chest. IMPRESSION: Placement right-sided chest tube, with resolution of right-sided pneumothorax. Electronically Signed   By: Jeronimo Greaves M.D.   On: 04/09/2019 10:29   Dg Chest Port 1 View  Result Date: 04/09/2019 CLINICAL DATA:  Decreased breath sounds EXAM: PORTABLE CHEST 1 VIEW COMPARISON:  Multiple priors, most recent March 30, 2019 FINDINGS: There is a recurrence moderate right-sided pneumothorax. No shift of the mediastinal structures seen. The cardiomediastinal silhouette is unchanged from prior. The left lung is clear. IMPRESSION: Recurrence of moderate right pneumothorax. Critical Value/emergent results were called by telephone at the time of interpretation on 04/09/2019 at 9:08 am to Dr. Eber Hong , who verbally acknowledged these results. Electronically Signed   By: Jonna Clark M.D.    On: 04/09/2019 09:08   Dg Chest Port 1 View  Result Date: 03/30/2019 CLINICAL DATA:  Dyspnea EXAM: PORTABLE CHEST 1 VIEW COMPARISON:  03/29/2019 FINDINGS: Lungs are hyperinflated. Marked emphysematous changes are identified at the apices, RIGHT greater than LEFT. No evidence for residual pneumothorax. Heart size is normal. Atherosclerotic calcification of the thoracic aorta. IMPRESSION: 1. COPD. 2. No evidence for acute cardiopulmonary abnormality. Electronically Signed   By: Norva Pavlov M.D.   On: 03/30/2019 08:44   Dg Chest  Port 1 View  Result Date: 03/29/2019 CLINICAL DATA:  Chest tube removal EXAM: PORTABLE CHEST 1 VIEW COMPARISON:  03/28/2019 FINDINGS: Trace pneumothorax at the medial right lung apex, unchanged. Bullous emphysematous changes in the upper lobes, right greater than left. No pleural effusion. The heart is normal in size.  Thoracic aortic atherosclerosis. IMPRESSION: Trace pneumothorax at the medial right lung apex, unchanged. Electronically Signed   By: Julian Hy M.D.   On: 03/29/2019 09:59   Dg Chest Port 1 View  Result Date: 03/28/2019 CLINICAL DATA:  Encounter for chest tube removal. EXAM: PORTABLE CHEST 1 VIEW COMPARISON:  Radiograph of same day. FINDINGS: The heart size and mediastinal contours are within normal limits. Atherosclerosis of thoracic aorta is noted. Stable emphysematous disease is noted in both lungs. Right-sided chest tube has been removed. Small right apical pneumothorax noted on prior exam is not significantly changed. No pleural effusion is noted. The visualized skeletal structures are unremarkable. IMPRESSION: Stable small right apical pneumothorax is noted status post chest tube removal. Aortic Atherosclerosis (ICD10-I70.0) and Emphysema (ICD10-J43.9). Electronically Signed   By: Marijo Conception M.D.   On: 03/28/2019 11:49   Dg Chest Port 1 View  Result Date: 03/28/2019 CLINICAL DATA:  Chest tube, pneumothorax EXAM: PORTABLE CHEST 1 VIEW  COMPARISON:  03/27/2019 FINDINGS: Right chest tube remains in place, unchanged. Small right apical pneumothorax again noted, stable. There is hyperinflation of the lungs compatible with COPD. Heart is normal size. No effusions. IMPRESSION: Stable small right apical pneumothorax with right chest tube in stable position. COPD. Electronically Signed   By: Rolm Baptise M.D.   On: 03/28/2019 08:13   Dg Chest Port 1 View  Result Date: 03/27/2019 CLINICAL DATA:  Chest tube EXAM: PORTABLE CHEST 1 VIEW COMPARISON:  03/25/2019 FINDINGS: Right chest tube remains in place. Small residual right pneumothorax is unchanged. There is hyperinflation of the lungs compatible with COPD. Heart is borderline in size. No confluent opacities or effusions. IMPRESSION: Stable small right apical pneumothorax with right chest tube in place. COPD Electronically Signed   By: Rolm Baptise M.D.   On: 03/27/2019 10:53   Dg Chest Port 1 View  Result Date: 03/25/2019 CLINICAL DATA:  Chest tube EXAM: PORTABLE CHEST 1 VIEW COMPARISON:  Chest x-ray dated March 25, 2019 FINDINGS: There has been interval placement of a right-sided chest tube. There is a trace residual right-sided pneumothorax. No left-sided pneumothorax. The lungs are hyperexpanded. Coarsened lung markings are noted bilaterally consistent with scarring. Large bowl are noted bilaterally. The heart size is normal two slightly enlarged. Aortic calcifications are noted. The pulmonary arteries are significantly enlarged as seen on prior CT. IMPRESSION: 1. Status post placement of a right-sided chest tube. There is a trace residual right-sided pneumothorax. 2. Multiple additional chronic findings as detailed above, similar across prior studies. Electronically Signed   By: Constance Holster M.D.   On: 03/25/2019 19:55   Dg Chest Port 1 View  Result Date: 03/24/2019 CLINICAL DATA:  Status post right chest tube removal EXAM: PORTABLE CHEST 1 VIEW COMPARISON:  Film from earlier in the  same day. FINDINGS: Previously seen pigtail catheter has been removed in the interval. No significant recurrent pneumothorax is noted. Emphysematous changes are noted in the apices bilaterally. No new focal infiltrate is seen. No bony abnormality is noted. IMPRESSION: No pneumothorax following pigtail removal. Electronically Signed   By: Inez Catalina M.D.   On: 03/24/2019 16:07   Dg Chest Port 1 View  Result Date: 03/24/2019  CLINICAL DATA:  68 year old male with right chest tube EXAM: PORTABLE CHEST 1 VIEW COMPARISON:  Multiple prior most recent March 21, 2019 FINDINGS: Cardiomediastinal silhouette unchanged, incompletely imaged. Unchanged position of right thoracostomy tube. No visualized pneumothorax. Emphysematous changes again noted with chronic interstitial opacities bilaterally. No new confluent airspace disease. IMPRESSION: Unchanged right-sided thoracostomy tube with no pneumothorax. Electronically Signed   By: Gilmer Mor D.O.   On: 03/24/2019 09:39   Dg Chest Port 1 View  Result Date: 03/21/2019 CLINICAL DATA:  Shortness of breath, follow-up chest tube EXAM: PORTABLE CHEST 1 VIEW COMPARISON:  03/20/2019 FINDINGS: Cardiac shadow is stable. Right pigtail catheter is again noted. No definitive pneumothorax is seen. No focal infiltrate or sizable effusion is noted. Aortic calcifications are seen. IMPRESSION: No definitive pneumothorax.  Stable right chest tube is noted. Electronically Signed   By: Alcide Clever M.D.   On: 03/21/2019 08:34   Dg Chest Port 1 View  Result Date: 03/20/2019 CLINICAL DATA:  Increasing hypoxia. EXAM: PORTABLE CHEST 1 VIEW COMPARISON:  Plain film CT of earlier today. FINDINGS: 0907 hours. Right-sided pigtail catheter again projects over the right mid lung. No residual pneumothorax identified. Midline trachea. Normal heart size. Prominent pulmonary arteries. Tortuous thoracic aorta. No pleural fluid. Clear lungs. IMPRESSION: Right-sided pigtail catheter in place, without  plain film evidence of residual pneumothorax. Clearing of right base airspace disease since radiographs of earlier today. Pulmonary artery enlargement suggests pulmonary arterial hypertension. Electronically Signed   By: Jeronimo Greaves M.D.   On: 03/20/2019 21:22   Dg Chest Portable 1 View  Result Date: 03/20/2019 CLINICAL DATA:  Shortness of breath EXAM: PORTABLE CHEST 1 VIEW COMPARISON:  October 04, 2018 FINDINGS: There is a large pneumothorax on the right without appreciable tension component. Lungs otherwise are clear. Heart is slightly enlarged with mild pulmonary venous hypertension. No adenopathy. There is aortic atherosclerosis. No bone lesions. IMPRESSION: Large pneumothorax on the right without appreciable tension component. No edema or consolidation. Mild cardiac enlargement. Aortic Atherosclerosis (ICD10-I70.0). Critical Value/emergent results were called by telephone at the time of interpretation on 03/20/2019 at 2:44 pm to Dr. Marianna Fuss , who verbally acknowledged these results. Electronically Signed   By: Bretta Bang III M.D.   On: 03/20/2019 14:44   Dg Chest Port 1v Same Day  Result Date: 03/20/2019 CLINICAL DATA:  Chest tube placement for pneumothorax. EXAM: PORTABLE CHEST 1 VIEW COMPARISON:  Radiograph earlier this day at 2:28 p.m. FINDINGS: Right pigtail catheter placement. Decreased right pneumothorax, possible small residual apical component. Minimal right lung base patchy opacity may be atelectasis are re-expansion pulmonary edema. Suspect underlying emphysema. Unchanged heart size and mediastinal contours. No other change from prior exam. IMPRESSION: Right pigtail catheter placement with decreased right pneumothorax, question small residual atypical component. Minimal right lung base opacity may be atelectasis or re-expansion pulmonary edema. Electronically Signed   By: Narda Rutherford M.D.   On: 03/20/2019 16:29       I have independently reviewed the above radiology  studies  and reviewed the findings with the patient.   Recent Lab Findings: Lab Results  Component Value Date   WBC 9.0 04/10/2019   HGB 13.0 04/10/2019   HCT 41.5 04/10/2019   PLT 213 04/10/2019   GLUCOSE 122 (H) 04/10/2019   ALT 13 03/27/2019   AST 22 03/27/2019   NA 136 04/10/2019   K 5.1 04/10/2019   CL 93 (L) 04/10/2019   CREATININE 0.69 04/10/2019   BUN 19 04/10/2019  CO2 33 (H) 04/10/2019         Assessment / Plan:   68 yo male with severe bullous emphysema, now with his 3rd pneumothorax this month.  OR tomorrow for Betadine pleurodesis, and endobronchial valve placement.      Corliss SkainsHarrell O Yanci Bachtell 04/10/2019 12:17 PM

## 2019-04-10 NOTE — Progress Notes (Signed)
PROGRESS NOTE    Derrick [name]  AOZ:[id number] DOB: [birth year] DOA: 04/09/2019 PCP: Lemmie Evens, MD   Brief Narrative:  68 y.o.malewith medical history significant forCOPD, pulmonary hypertension, heavy tobacco user, diastolic CHF and rheumatoid arthritis, with recurrent pneumothorax presents to any pain hospital initially with shortness of breath.  Found to have a moderate-sized right-sided pneumothorax.  Transferred here for further care management per CT surgery.   Assessment & Plan:   Principal Problem:   Pneumothorax on right Active Problems:   Pulmonary HTN (HCC)   Acute respiratory failure with hypoxia (HCC)   Spontaneous pneumothorax   Protein-calorie malnutrition, severe   Chest tube in place   Status post chest tube placement (HCC)   Weakness generalized   Nicotine dependence, chewing tobacco, uncomplicated   RA (rheumatoid arthritis) (HCC)   CHF (congestive heart failure) (HCC)   COPD (chronic obstructive pulmonary disease) (HCC)  Acute hypoxic respiratory failure, 6 L nasal cannula, recurrent right-sided moderate pneumothorax - Currently is chest tube in place.  Aggressive spirometer -CT surgery following, spoke with Dr. Kipp Brood.  Plans for endobronchial valve placement tomorrow.  We will keep him n.p.o. past midnight.  Supportive care.  History of COPD with severe emphysema with active tobacco use - Not actively wheezing.  Continue current bronchodilators  Chronic diastolic congestive heart failure with preserved ejection fraction, 60% -Continue home meds, no obvious signs of fluid overload echo in February 2020 showed ejection fraction 60%.  Severe protein calorie malnutrition -During previous admission he was seen by nutrition team recently  DVT prophylaxis: SCDs Code Status: Full code Family Communication: None at bedside Disposition Plan: Recurrent pneumothorax, require surgical intervention.  Maintain inpatient stay.  Has a chest tube in place.   Consultants:   CT surgery  Procedures:   None so far  Antimicrobials:   None   Subjective: After having chest tube placed, tells me he feels little better but afraid about this recurrent problem.  He is okay with surgical intervention  Review of Systems Otherwise negative except as per HPI, including: General = no fevers, chills, dizziness, malaise, fatigue HEENT/EYES = negative for pain, redness, loss of vision, double vision, blurred vision, loss of hearing, sore throat, hoarseness, dysphagia Cardiovascular= negative for chest pain, palpitation, murmurs, lower extremity swelling Respiratory/lungs= negative for shortness of breath, cough, hemoptysis, wheezing, mucus production Gastrointestinal= negative for nausea, vomiting,, abdominal pain, melena, hematemesis Genitourinary= negative for Dysuria, Hematuria, Change in Urinary Frequency MSK = Negative for arthralgia, myalgias, Back Pain, Joint swelling  Neurology= Negative for headache, seizures, numbness, tingling  Psychiatry= Negative for anxiety, depression, suicidal and homocidal ideation Allergy/Immunology= Medication/Food allergy as listed  Skin= Negative for Rash, lesions, ulcers, itching   Objective: Vitals:   04/09/19 1950 04/10/19 0305 04/10/19 1050 04/10/19 1051  BP: (!) 144/86 102/70 127/75 127/75  Pulse: 73 60  60  Resp: (!) 21 20    Temp:  97.6 F (36.4 C)  97.7 F (36.5 C)  TempSrc:  Oral  Axillary  SpO2: 97% 94%  95%  Weight:      Height:        Intake/Output Summary (Last 24 hours) at 04/10/2019 1154 Last data filed at 04/10/2019 0305 Gross per 24 hour  Intake 240 ml  Output 150 ml  Net 90 ml   Filed Weights   04/09/19 0830  Weight: 54.4 kg    Examination: Constitutional: Cachectic elderly frail, 6 L nasal cannula Eyes: PERRL, lids and conjunctivae normal ENMT: Mucous membranes are moist. Posterior pharynx  clear of any exudate or lesions.Normal dentition.  Neck: normal, supple, no masses, no  thyromegaly Respiratory: clear to auscultation bilaterally, no wheezing, no crackles. Normal respiratory effort. No accessory muscle use.  Cardiovascular: Regular rate and rhythm, no murmurs / rubs / gallops. No extremity edema. 2+ pedal pulses. No carotid bruits.  Abdomen: no tenderness, no masses palpated. No hepatosplenomegaly. Bowel sounds positive.  Musculoskeletal: no clubbing / cyanosis. No joint deformity upper and lower extremities. Good ROM, no contractures. Normal muscle tone.  Skin: no rashes, lesions, ulcers. No induration Neurologic: CN 2-12 grossly intact. Sensation intact, DTR normal. Strength 4/5 in all 4.  Psychiatric: Normal judgment and insight. Alert and oriented x 3. Normal mood.   Chest tube currently in place  Data Reviewed:   CBC: Recent Labs  Lab 04/09/19 0856 04/10/19 0256  WBC 5.4 9.0  HGB 13.2 13.0  HCT 43.4 41.5  MCV 103.6* 100.7*  PLT 212 213   Basic Metabolic Panel: Recent Labs  Lab 04/09/19 0856 04/10/19 0256  NA 139 136  K 4.7 5.1  CL 97* 93*  CO2 36* 33*  GLUCOSE 99 122*  BUN 17 19  CREATININE 0.73 0.69  CALCIUM 9.1 8.9   GFR: Estimated Creatinine Clearance: 69.9 mL/min (by C-G formula based on SCr of 0.69 mg/dL). Liver Function Tests: No results for input(s): AST, ALT, ALKPHOS, BILITOT, PROT, ALBUMIN in the last 168 hours. No results for input(s): LIPASE, AMYLASE in the last 168 hours. No results for input(s): AMMONIA in the last 168 hours. Coagulation Profile: No results for input(s): INR, PROTIME in the last 168 hours. Cardiac Enzymes: No results for input(s): CKTOTAL, CKMB, CKMBINDEX, TROPONINI in the last 168 hours. BNP (last 3 results) No results for input(s): PROBNP in the last 8760 hours. HbA1C: No results for input(s): HGBA1C in the last 72 hours. CBG: No results for input(s): GLUCAP in the last 168 hours. Lipid Profile: No results for input(s): CHOL, HDL, LDLCALC, TRIG, CHOLHDL, LDLDIRECT in the last 72 hours.  Thyroid Function Tests: No results for input(s): TSH, T4TOTAL, FREET4, T3FREE, THYROIDAB in the last 72 hours. Anemia Panel: No results for input(s): VITAMINB12, FOLATE, FERRITIN, TIBC, IRON, RETICCTPCT in the last 72 hours. Sepsis Labs: No results for input(s): PROCALCITON, LATICACIDVEN in the last 168 hours.  Recent Results (from the past 240 hour(s))  SARS Coronavirus 2 [facility] order, Performed in [facility] hospital lab) Nasopharyngeal Nasopharyngeal Swab     Status: None   Collection Time: 04/09/19  8:42 AM   Specimen: Nasopharyngeal Swab  Result Value Ref Range Status   SARS Coronavirus 2 NEGATIVE NEGATIVE Final    Comment: (NOTE) If result is NEGATIVE SARS-CoV-2 target nucleic acids are NOT DETECTED. The SARS-CoV-2 RNA is generally detectable in upper and lower  respiratory specimens during the acute phase of infection. The lowest  concentration of SARS-CoV-2 viral copies this assay can detect is 250  copies / mL. A negative result does not preclude SARS-CoV-2 infection  and should not be used as the sole basis for treatment or other  patient management decisions.  A negative result may occur with  improper specimen collection / handling, submission of specimen other  than nasopharyngeal swab, presence of viral mutation(s) within the  areas targeted by this assay, and inadequate number of viral copies  (<250 copies / mL). A negative result must be combined with clinical  observations, patient history, and epidemiological information. If result is POSITIVE SARS-CoV-2 target nucleic acids are DETECTED. The SARS-CoV-2 RNA is generally  detectable in upper and lower  respiratory specimens dur ing the acute phase of infection.  Positive  results are indicative of active infection with SARS-CoV-2.  Clinical  correlation with patient history and other diagnostic information is  necessary to determine patient infection status.  Positive results do  not rule out bacterial  infection or co-infection with other viruses. If result is PRESUMPTIVE POSTIVE SARS-CoV-2 nucleic acids MAY BE PRESENT.   A presumptive positive result was obtained on the submitted specimen  and confirmed on repeat testing.  While 2019 novel coronavirus  (SARS-CoV-2) nucleic acids may be present in the submitted sample  additional confirmatory testing may be necessary for epidemiological  and / or clinical management purposes  to differentiate between  SARS-CoV-2 and other Sarbecovirus currently known to infect humans.  If clinically indicated additional testing with an alternate test  methodology [phone]([id number]) is advised. The SARS-CoV-2 RNA is generally  detectable in upper and lower respiratory sp ecimens during the acute  phase of infection. The expected result is Negative. Fact Sheet for Patients:  BoilerBrush.com.cyhttps://www.fda.gov/media/136312/download Fact Sheet for Healthcare Providers: https://pope.com/https://www.fda.gov/media/136313/download This test is not yet approved or cleared by the [place] FDA and has been authorized for detection and/or diagnosis of SARS-CoV-2 by FDA under an Emergency Use Authorization (EUA).  This EUA will remain in effect (meaning this test can be used) for the duration of the COVID-19 declaration under Section 564(b)(1) of the Act, 21 U.S.C. section 360bbb-3(b)(1), unless the authorization is terminated or revoked sooner. Performed at [facility], [address] [place], [place] [address]          Radiology Studies: Portable Chest 1 View  Result Date: 04/10/2019 CLINICAL DATA:  Follow-up pneumothorax EXAM: PORTABLE CHEST 1 VIEW COMPARISON:  04/09/2019 FINDINGS: Cardiac shadow is stable. Aortic calcifications are again seen. The lungs are hyperinflated. The left lung remains clear with emphysematous changes. Right-sided chest tube is again seen in the apex. No definitive pneumothorax is seen. Emphysematous changes are again noted. IMPRESSION: Changes of COPD.  No  recurrent pneumothorax is noted. Electronically Signed   By: Alcide CleverMark  Lukens M.D.   On: 04/10/2019 08:34   Dg Chest Port 1 View  Result Date: 04/09/2019 CLINICAL DATA:  Chest tube placement EXAM: PORTABLE CHEST 1 VIEW COMPARISON:  Earlier today at 0840 hours FINDINGS: 955 hours. Midline trachea. Mild cardiomegaly. No pleural fluid. Placement of a right-sided small bore chest tube. Re-expansion of the right lung, without residual pneumothorax. No lobar consolidation. Underlying hyperinflation consistent with COPD. Subcutaneous emphysema about the right chest. IMPRESSION: Placement right-sided chest tube, with resolution of right-sided pneumothorax. Electronically Signed   By: Jeronimo GreavesKyle  Talbot M.D.   On: 04/09/2019 10:29   Dg Chest Port 1 View  Result Date: 04/09/2019 CLINICAL DATA:  Decreased breath sounds EXAM: PORTABLE CHEST 1 VIEW COMPARISON:  Multiple priors, most recent March 30, 2019 FINDINGS: There is a recurrence moderate right-sided pneumothorax. No shift of the mediastinal structures seen. The cardiomediastinal silhouette is unchanged from prior. The left lung is clear. IMPRESSION: Recurrence of moderate right pneumothorax. Critical Value/emergent results were called by telephone at the time of interpretation on 04/09/2019 at 9:08 am to Dr. Eber HongBRIAN MILLER , who verbally acknowledged these results. Electronically Signed   By: Jonna ClarkBindu  Avutu M.D.   On: 04/09/2019 09:08        Scheduled Meds: . acetaminophen  650 mg Oral Q6H   Or  . acetaminophen  650 mg Rectal Q6H  . carvedilol  3.125 mg Oral BID  .  docusate sodium  100 mg Oral BID  . fluticasone  2 spray Each Nare Daily  . furosemide  40 mg Oral Daily  . lisinopril  2.5 mg Oral Daily  . mouth rinse  15 mL Mouth Rinse BID  . potassium chloride SA  20 mEq Oral Daily  . sodium chloride flush  3 mL Intravenous Q12H   Continuous Infusions: . sodium chloride       LOS: 1 day   Time spent= 35 mins    Amos Micheals Joline Maxcyhirag Maeli Spacek, MD [facility]   If 7PM-7AM, please contact night-coverage [email or web address] 04/10/2019, 11:54 AM

## 2019-04-11 ENCOUNTER — Inpatient Hospital Stay (HOSPITAL_COMMUNITY): Payer: Medicare HMO | Admitting: Anesthesiology

## 2019-04-11 ENCOUNTER — Encounter (HOSPITAL_COMMUNITY): Payer: Self-pay | Admitting: Anesthesiology

## 2019-04-11 ENCOUNTER — Encounter (HOSPITAL_COMMUNITY)
Admission: EM | Disposition: A | Discharge status: Home Health | Payer: Self-pay | Source: Home / Self Care | Attending: Internal Medicine

## 2019-04-11 HISTORY — PX: VIDEO BRONCHOSCOPY WITH INSERTION OF INTERBRONCHIAL VALVE (IBV): SHX6178

## 2019-04-11 LAB — CBC
HCT: 39.4 % (ref 39.0–52.0)
Hemoglobin: 12.6 g/dL — ABNORMAL LOW (ref 13.0–17.0)
MCH: 31.7 pg (ref 26.0–34.0)
MCHC: 32 g/dL (ref 30.0–36.0)
MCV: 99.2 fL (ref 80.0–100.0)
Platelets: 202 10*3/uL (ref 150–400)
RBC: 3.97 MIL/uL — ABNORMAL LOW (ref 4.22–5.81)
RDW: 11.7 % (ref 11.5–15.5)
WBC: 8.1 10*3/uL (ref 4.0–10.5)
nRBC: 0 % (ref 0.0–0.2)

## 2019-04-11 LAB — BASIC METABOLIC PANEL
Anion gap: 7 (ref 5–15)
BUN: 15 mg/dL (ref 8–23)
CO2: 37 mmol/L — ABNORMAL HIGH (ref 22–32)
Calcium: 8.9 mg/dL (ref 8.9–10.3)
Chloride: 90 mmol/L — ABNORMAL LOW (ref 98–111)
Creatinine, Ser: 0.72 mg/dL (ref 0.61–1.24)
GFR calc Af Amer: >60 mL/min (ref >60)
GFR calc non Af Amer: >60 mL/min (ref >60)
Glucose, Bld: 92 mg/dL (ref 70–99)
Potassium: 4.7 mmol/L (ref 3.5–5.1)
Sodium: 134 mmol/L — ABNORMAL LOW (ref 135–145)

## 2019-04-11 LAB — MAGNESIUM: Magnesium: 1.6 mg/dL — ABNORMAL LOW (ref 1.7–2.4)

## 2019-04-11 LAB — SURGICAL PCR SCREEN
MRSA, PCR: NEGATIVE
Staphylococcus aureus: NEGATIVE

## 2019-04-11 SURGERY — BRONCHOSCOPY, FLEXIBLE, WITH INTRABRONCHIAL VALVE INSERTION
Anesthesia: General

## 2019-04-11 MED ORDER — ROCURONIUM BROMIDE 10 MG/ML (PF) SYRINGE
PREFILLED_SYRINGE | INTRAVENOUS | Status: DC | PRN
  Administered 2019-04-11: 40 mg via INTRAVENOUS

## 2019-04-11 MED ORDER — ONDANSETRON HCL 4 MG/2ML IJ SOLN
INTRAMUSCULAR | Status: AC
  Filled 2019-04-11: qty 2

## 2019-04-11 MED ORDER — EPHEDRINE SULFATE-NACL 50-0.9 MG/10ML-% IV SOSY
PREFILLED_SYRINGE | INTRAVENOUS | Status: DC | PRN
  Administered 2019-04-11 (×2): 5 mg via INTRAVENOUS

## 2019-04-11 MED ORDER — MORPHINE SULFATE (PF) 2 MG/ML IV SOLN: 1.0000 mg | INTRAVENOUS | Status: AC | PRN

## 2019-04-11 MED ORDER — PROPOFOL 10 MG/ML IV BOLUS
INTRAVENOUS | Status: DC | PRN
  Administered 2019-04-11: 100 mg via INTRAVENOUS

## 2019-04-11 MED ORDER — PROPOFOL 10 MG/ML IV BOLUS
INTRAVENOUS | Status: AC
  Filled 2019-04-11: qty 20

## 2019-04-11 MED ORDER — DEXAMETHASONE SODIUM PHOSPHATE 10 MG/ML IJ SOLN
INTRAMUSCULAR | Status: DC | PRN
  Administered 2019-04-11: 10 mg via INTRAVENOUS

## 2019-04-11 MED ORDER — ROCURONIUM BROMIDE 10 MG/ML (PF) SYRINGE
PREFILLED_SYRINGE | INTRAVENOUS | Status: AC
  Filled 2019-04-11: qty 10

## 2019-04-11 MED ORDER — FENTANYL CITRATE (PF) 250 MCG/5ML IJ SOLN
INTRAMUSCULAR | Status: AC
  Filled 2019-04-11: qty 5

## 2019-04-11 MED ORDER — FENTANYL CITRATE (PF) 100 MCG/2ML IJ SOLN
INTRAMUSCULAR | Status: DC | PRN
  Administered 2019-04-11: 50 ug via INTRAVENOUS

## 2019-04-11 MED ORDER — DEXAMETHASONE SODIUM PHOSPHATE 10 MG/ML IJ SOLN
INTRAMUSCULAR | Status: AC
  Filled 2019-04-11: qty 1

## 2019-04-11 MED ORDER — SUGAMMADEX SODIUM 200 MG/2ML IV SOLN
INTRAVENOUS | Status: DC | PRN
  Administered 2019-04-11: 200 mg via INTRAVENOUS

## 2019-04-11 MED ORDER — LIDOCAINE 2% (20 MG/ML) 5 ML SYRINGE
INTRAMUSCULAR | Status: AC
  Filled 2019-04-11: qty 5

## 2019-04-11 MED ORDER — SODIUM CHLORIDE 0.9 % IV SOLN
INTRAVENOUS | Status: DC | PRN
  Administered 2019-04-11: 08:00:00 50 ug/min via INTRAVENOUS

## 2019-04-11 MED ORDER — PHENYLEPHRINE 40 MCG/ML (10ML) SYRINGE FOR IV PUSH (FOR BLOOD PRESSURE SUPPORT)
PREFILLED_SYRINGE | INTRAVENOUS | Status: AC
  Filled 2019-04-11: qty 10

## 2019-04-11 MED ORDER — LIDOCAINE 2% (20 MG/ML) 5 ML SYRINGE
INTRAMUSCULAR | Status: DC | PRN
  Administered 2019-04-11: 60 mg via INTRAVENOUS

## 2019-04-11 MED ORDER — PHENYLEPHRINE 40 MCG/ML (10ML) SYRINGE FOR IV PUSH (FOR BLOOD PRESSURE SUPPORT)
PREFILLED_SYRINGE | INTRAVENOUS | Status: DC | PRN
  Administered 2019-04-11 (×2): 80 ug via INTRAVENOUS

## 2019-04-11 MED ORDER — MIDAZOLAM HCL 2 MG/2ML IJ SOLN
INTRAMUSCULAR | Status: AC
  Filled 2019-04-11: qty 2

## 2019-04-11 MED ORDER — EPINEPHRINE PF 1 MG/ML IJ SOLN
INTRAMUSCULAR | Status: AC
  Filled 2019-04-11: qty 1

## 2019-04-11 MED ORDER — 0.9 % SODIUM CHLORIDE (POUR BTL) OPTIME
TOPICAL | Status: DC | PRN
  Administered 2019-04-11: 1000 mL

## 2019-04-11 MED ORDER — ONDANSETRON HCL 4 MG/2ML IJ SOLN
INTRAMUSCULAR | Status: DC | PRN
  Administered 2019-04-11: 4 mg via INTRAVENOUS

## 2019-04-11 MED ORDER — LACTATED RINGERS IV SOLN
INTRAVENOUS | Status: DC | PRN
  Administered 2019-04-11: 07:00:00 via INTRAVENOUS

## 2019-04-11 MED ORDER — EPHEDRINE 5 MG/ML INJ
INTRAVENOUS | Status: AC
  Filled 2019-04-11: qty 10

## 2019-04-11 SURGICAL SUPPLY — 36 items
BLADE CLIPPER SURG (BLADE) ×3 IMPLANT
CANISTER SUCT 3000ML PPV (MISCELLANEOUS) ×3 IMPLANT
CATH LOADER DEPLOYMENT HUD (CATHETERS) ×2 IMPLANT
CONNECTOR 5 IN 1 STRAIGHT STRL (MISCELLANEOUS) ×2 IMPLANT
CONT SPEC 4OZ CLIKSEAL STRL BL (MISCELLANEOUS) ×3 IMPLANT
COVER BACK TABLE 60X90IN (DRAPES) ×3 IMPLANT
FORCEPS RADIAL JAW LRG 4 PULM (INSTRUMENTS) IMPLANT
GAUZE SPONGE 4X4 12PLY STRL (GAUZE/BANDAGES/DRESSINGS) ×3 IMPLANT
GLOVE BIO SURGEON STRL SZ7 (GLOVE) ×3 IMPLANT
GLOVE BIO SURGEON STRL SZ7.5 (GLOVE) ×3 IMPLANT
GOWN STRL REUS W/ TWL XL LVL3 (GOWN DISPOSABLE) ×1 IMPLANT
GOWN STRL REUS W/TWL XL LVL3 (GOWN DISPOSABLE) ×3
KIT AIRWAY SIZING HUD (KITS) ×2 IMPLANT
KIT CLEAN ENDO COMPLIANCE (KITS) ×3 IMPLANT
KIT TURNOVER KIT B (KITS) ×3 IMPLANT
MARKER SKIN DUAL TIP RULER LAB (MISCELLANEOUS) ×3 IMPLANT
NS IRRIG 1000ML POUR BTL (IV SOLUTION) ×3 IMPLANT
RADIAL JAW LRG 4 PULMONARY (INSTRUMENTS) ×2
SOL PREP POV-IOD 4OZ 10% (MISCELLANEOUS) ×2 IMPLANT
STOPCOCK MORSE 400PSI 3WAY (MISCELLANEOUS) ×3 IMPLANT
SYR 10ML LL (SYRINGE) ×3 IMPLANT
SYR 20ML ECCENTRIC (SYRINGE) ×3 IMPLANT
SYR 50ML LL SCALE MARK (SYRINGE) ×2 IMPLANT
TOWEL GREEN STERILE (TOWEL DISPOSABLE) ×3 IMPLANT
TOWEL GREEN STERILE FF (TOWEL DISPOSABLE) ×3 IMPLANT
TOWEL NATURAL 4PK STERILE (DISPOSABLE) ×3 IMPLANT
TRAP SPECIMEN MUCOUS 40CC (MISCELLANEOUS) ×3 IMPLANT
TUBE CONNECTING 20'X1/4 (TUBING) ×2
TUBE CONNECTING 20X1/4 (TUBING) ×3 IMPLANT
UNDERPAD 30X30 (UNDERPADS AND DIAPERS) ×3 IMPLANT
VALVE BIOPSY  SINGLE USE (MISCELLANEOUS) ×2
VALVE BIOPSY SINGLE USE (MISCELLANEOUS) ×1 IMPLANT
VALVE IN CARTRIDGE 5MM HUD (Valve) ×2 IMPLANT
VALVE IN CARTRIDGE 7MM HUD (Valve) ×4 IMPLANT
VALVE SUCTION BRONCHIO DISP (MISCELLANEOUS) ×3 IMPLANT
WATER STERILE IRR 1000ML POUR (IV SOLUTION) ×3 IMPLANT

## 2019-04-11 NOTE — Anesthesia Postprocedure Evaluation (Signed)
Anesthesia Post Note  Patient: Derrick [name]  Procedure(s) Performed: VIDEO BRONCHOSCOPY WITH INSERTION OF INTERBRONCHIAL VALVE (IBV) (N/A )     Patient location during evaluation: PACU Anesthesia Type: General Level of consciousness: awake Pain management: pain level controlled Vital Signs Assessment: post-procedure vital signs reviewed and stable Respiratory status: spontaneous breathing, nonlabored ventilation, respiratory function stable and patient connected to nasal cannula oxygen Cardiovascular status: blood pressure returned to baseline and stable Postop Assessment: no apparent nausea or vomiting Anesthetic complications: no    Last Vitals:  Vitals:   04/11/19 0945 04/11/19 1331  BP: 122/81 111/77  Pulse: 70 90  Resp: (!) 23   Temp: (!) 36.3 C   SpO2: 92%     Last Pain:  Vitals:   04/11/19 0945  TempSrc:   PainSc: 0-No pain                 Ryan P Ellender

## 2019-04-11 NOTE — Progress Notes (Signed)
PROGRESS NOTE    Derrick [name]  FMB:[id number] DOB: Jan 10, 1951 DOA: 04/09/2019 PCP: Gareth Morgan, MD   Brief Narrative:  68 y.o.malewith medical history significant forCOPD, pulmonary hypertension, heavy tobacco user, diastolic CHF and rheumatoid arthritis, with recurrent pneumothorax presents to any pain hospital initially with shortness of breath.  Found to have a moderate-sized right-sided pneumothorax.  Transferred here for further care management per CT surgery.  Underwent endobronchial valve placement 9/4.   Assessment & Plan:   Principal Problem:   Pneumothorax on right Active Problems:   Pulmonary HTN (HCC)   Acute respiratory failure with hypoxia (HCC)   Spontaneous pneumothorax   Protein-calorie malnutrition, severe   Chest tube in place   Status post chest tube placement (HCC)   Weakness generalized   Nicotine dependence, chewing tobacco, uncomplicated   RA (rheumatoid arthritis) (HCC)   CHF (congestive heart failure) (HCC)   COPD (chronic obstructive pulmonary disease) (HCC)  Acute hypoxic respiratory failure, 6 L nasal cannula, recurrent right-sided moderate pneumothorax Severe bullous emphysema - Status post endobronchial valve placement 9/4.  Chest tube currently in place per direction from CT surgery. -Aggressive use of incentive spirometer.  Supportive care.  Supplemental oxygen.  History of COPD with severe emphysema with active tobacco use - Not actively wheezing.  Continue current bronchodilators  Chronic diastolic congestive heart failure with preserved ejection fraction, 60% -Continue home meds, no obvious signs of fluid overload echo in February 2020 showed ejection fraction 60%.  Severe protein calorie malnutrition -During previous admission he was seen by nutrition team recently  DVT prophylaxis: SCDs Code Status: DNR Family Communication: None at bedside Disposition Plan: Maintain hospital stay as patient has newly placed endobronchial valve  and currently is chest tube in place.  Consultants:   CT surgery  Procedures:   Endobronchial valve placement 9/4  Antimicrobials:   None   Subjective: Patient seen and examined after his procedure, doing well.  Does not have complaints at the moment.  Review of Systems Otherwise negative except as per HPI, including: General = no fevers, chills, dizziness, malaise, fatigue HEENT/EYES = negative for pain, redness, loss of vision, double vision, blurred vision, loss of hearing, sore throat, hoarseness, dysphagia Cardiovascular= negative for chest pain, palpitation, murmurs, lower extremity swelling Respiratory/lungs= negative for shortness of breath, cough, hemoptysis, wheezing, mucus production Gastrointestinal= negative for nausea, vomiting,, abdominal pain, melena, hematemesis Genitourinary= negative for Dysuria, Hematuria, Change in Urinary Frequency MSK = Negative for arthralgia, myalgias, Back Pain, Joint swelling  Neurology= Negative for headache, seizures, numbness, tingling  Psychiatry= Negative for anxiety, depression, suicidal and homocidal ideation Allergy/Immunology= Medication/Food allergy as listed  Skin= Negative for Rash, lesions, ulcers, itching   Objective: Vitals:   04/11/19 0900 04/11/19 0915 04/11/19 0930 04/11/19 0945  BP: (!) 132/97 124/83 116/83 122/81  Pulse: 70 70 68 70  Resp: 19 16 17  (!) 23  Temp:    (!) 97.3 F (36.3 C)  TempSrc:      SpO2: 99% 97% 94% 92%  Weight:      Height:        Intake/Output Summary (Last 24 hours) at 04/11/2019 1135 Last data filed at 04/11/2019 0945 Gross per 24 hour  Intake 500 ml  Output 620 ml  Net -120 ml   Filed Weights   04/09/19 0830 04/11/19 0547  Weight: 54.4 kg 52.6 kg    Examination: Constitutional: Cachectic elderly frail with bilateral temporal wasting, 5 L nasal cannula Eyes: PERRL, lids and conjunctivae normal ENMT: Mucous membranes are  moist. Posterior pharynx clear of any exudate or  lesions.Normal dentition.  Neck: normal, supple, no masses, no thyromegaly Respiratory: Diminished breath sounds bilaterally but no active wheezing Cardiovascular: Regular rate and rhythm, no murmurs / rubs / gallops. No extremity edema. 2+ pedal pulses. No carotid bruits.  Abdomen: no tenderness, no masses palpated. No hepatosplenomegaly. Bowel sounds positive.  Musculoskeletal: no clubbing / cyanosis. No joint deformity upper and lower extremities. Good ROM, no contractures. Normal muscle tone.  Skin: no rashes, lesions, ulcers. No induration Neurologic: CN 2-12 grossly intact. Sensation intact, DTR normal. Strength 4/5 in all 4.  Psychiatric: Normal judgment and insight. Alert and oriented x 3. Normal mood.   Chest tube currently in place  Data Reviewed:   CBC: Recent Labs  Lab 04/09/19 0856 04/10/19 0256 04/11/19 0237  WBC 5.4 9.0 8.1  HGB 13.2 13.0 12.6*  HCT 43.4 41.5 39.4  MCV 103.6* 100.7* 99.2  PLT 212 213 202   Basic Metabolic Panel: Recent Labs  Lab 04/09/19 0856 04/10/19 0256 04/11/19 0237  NA 139 136 134*  K 4.7 5.1 4.7  CL 97* 93* 90*  CO2 36* 33* 37*  GLUCOSE 99 122* 92  BUN 17 19 15   CREATININE 0.73 0.69 0.72  CALCIUM 9.1 8.9 8.9  MG  --   --  1.6*   GFR: Estimated Creatinine Clearance: 67.6 mL/min (by C-G formula based on SCr of 0.72 mg/dL). Liver Function Tests: No results for input(s): AST, ALT, ALKPHOS, BILITOT, PROT, ALBUMIN in the last 168 hours. No results for input(s): LIPASE, AMYLASE in the last 168 hours. No results for input(s): AMMONIA in the last 168 hours. Coagulation Profile: No results for input(s): INR, PROTIME in the last 168 hours. Cardiac Enzymes: No results for input(s): CKTOTAL, CKMB, CKMBINDEX, TROPONINI in the last 168 hours. BNP (last 3 results) No results for input(s): PROBNP in the last 8760 hours. HbA1C: No results for input(s): HGBA1C in the last 72 hours. CBG: No results for input(s): GLUCAP in the last 168 hours.  Lipid Profile: No results for input(s): CHOL, HDL, LDLCALC, TRIG, CHOLHDL, LDLDIRECT in the last 72 hours. Thyroid Function Tests: No results for input(s): TSH, T4TOTAL, FREET4, T3FREE, THYROIDAB in the last 72 hours. Anemia Panel: No results for input(s): VITAMINB12, FOLATE, FERRITIN, TIBC, IRON, RETICCTPCT in the last 72 hours. Sepsis Labs: No results for input(s): PROCALCITON, LATICACIDVEN in the last 168 hours.  Recent Results (from the past 240 hour(s))  SARS Coronavirus 2 [facility] order, Performed in [facility] hospital lab) Nasopharyngeal Nasopharyngeal Swab     Status: None   Collection Time: 04/09/19  8:42 AM   Specimen: Nasopharyngeal Swab  Result Value Ref Range Status   SARS Coronavirus 2 NEGATIVE NEGATIVE Final    Comment: (NOTE) If result is NEGATIVE SARS-CoV-2 target nucleic acids are NOT DETECTED. The SARS-CoV-2 RNA is generally detectable in upper and lower  respiratory specimens during the acute phase of infection. The lowest  concentration of SARS-CoV-2 viral copies this assay can detect is 250  copies / mL. A negative result does not preclude SARS-CoV-2 infection  and should not be used as the sole basis for treatment or other  patient management decisions.  A negative result may occur with  improper specimen collection / handling, submission of specimen other  than nasopharyngeal swab, presence of viral mutation(s) within the  areas targeted by this assay, and inadequate number of viral copies  (<250 copies / mL). A negative result must be combined with clinical  observations, patient history, and epidemiological information. If result is POSITIVE SARS-CoV-2 target nucleic acids are DETECTED. The SARS-CoV-2 RNA is generally detectable in upper and lower  respiratory specimens dur ing the acute phase of infection.  Positive  results are indicative of active infection with SARS-CoV-2.  Clinical  correlation with patient history and other diagnostic information  is  necessary to determine patient infection status.  Positive results do  not rule out bacterial infection or co-infection with other viruses. If result is PRESUMPTIVE POSTIVE SARS-CoV-2 nucleic acids MAY BE PRESENT.   A presumptive positive result was obtained on the submitted specimen  and confirmed on repeat testing.  While 2019 novel coronavirus  (SARS-CoV-2) nucleic acids may be present in the submitted sample  additional confirmatory testing may be necessary for epidemiological  and / or clinical management purposes  to differentiate between  SARS-CoV-2 and other Sarbecovirus currently known to infect humans.  If clinically indicated additional testing with an alternate test  methodology [phone]([id number]) is advised. The SARS-CoV-2 RNA is generally  detectable in upper and lower respiratory sp ecimens during the acute  phase of infection. The expected result is Negative. Fact Sheet for Patients:  BoilerBrush.com.cyhttps://www.fda.gov/media/136312/download Fact Sheet for Healthcare Providers: https://pope.com/https://www.fda.gov/media/136313/download This test is not yet approved or cleared by the [place] FDA and has been authorized for detection and/or diagnosis of SARS-CoV-2 by FDA under an Emergency Use Authorization (EUA).  This EUA will remain in effect (meaning this test can be used) for the duration of the COVID-19 declaration under Section 564(b)(1) of the Act, 21 U.S.C. section 360bbb-3(b)(1), unless the authorization is terminated or revoked sooner. Performed at [facility], [address] [place], [place] [address]   Surgical pcr screen     Status: None   Collection Time: 04/10/19 10:10 PM   Specimen: Nasal Mucosa; Nasal Swab  Result Value Ref Range Status   MRSA, PCR NEGATIVE NEGATIVE Final   Staphylococcus aureus NEGATIVE NEGATIVE Final    Comment: (NOTE) The Xpert SA Assay (FDA approved for NASAL specimens in patients 66 years of age and older), is one component of a comprehensive surveillance  program. It is not intended to diagnose infection nor to guide or monitor treatment. Performed at [facility], [address] [place], [place] [address]          Radiology Studies: Portable Chest 1 View  Result Date: 04/10/2019 CLINICAL DATA:  Follow-up pneumothorax EXAM: PORTABLE CHEST 1 VIEW COMPARISON:  04/09/2019 FINDINGS: Cardiac shadow is stable. Aortic calcifications are again seen. The lungs are hyperinflated. The left lung remains clear with emphysematous changes. Right-sided chest tube is again seen in the apex. No definitive pneumothorax is seen. Emphysematous changes are again noted. IMPRESSION: Changes of COPD.  No recurrent pneumothorax is noted. Electronically Signed   By: Alcide CleverMark  Lukens M.D.   On: 04/10/2019 08:34        Scheduled Meds: . acetaminophen  650 mg Oral Q6H   Or  . acetaminophen  650 mg Rectal Q6H  . carvedilol  3.125 mg Oral BID  . docusate sodium  100 mg Oral BID  . feeding supplement (ENSURE ENLIVE)  237 mL Oral TID BM  . fluticasone  2 spray Each Nare Daily  . furosemide  40 mg Oral Daily  . lisinopril  2.5 mg Oral Daily  . mouth rinse  15 mL Mouth Rinse BID  . multivitamin with minerals  1 tablet Oral Daily  . potassium chloride SA  20 mEq Oral Daily  .  sodium chloride flush  3 mL Intravenous Q12H   Continuous Infusions: . sodium chloride       LOS: 2 days   Time spent= 35 mins    Ankit Arsenio Loader, MD [facility]  If 7PM-7AM, please contact night-coverage [email or web address] 04/11/2019, 11:35 AM

## 2019-04-11 NOTE — Progress Notes (Signed)
Pt sent to OR via transporter. Report called to Anesthesia and all questions answered.

## 2019-04-11 NOTE — Anesthesia Procedure Notes (Signed)
Procedure Name: Intubation Date/Time: 04/11/2019 7:41 AM Performed by: Kyung Rudd, CRNA Pre-anesthesia Checklist: Patient identified, Emergency Drugs available, Suction available, Patient being monitored and Timeout performed Patient Re-evaluated:Patient Re-evaluated prior to induction Oxygen Delivery Method: Circle system utilized Preoxygenation: Pre-oxygenation with 100% oxygen Induction Type: IV induction Ventilation: Mask ventilation without difficulty Laryngoscope Size: Mac and 4 Grade View: Grade I Tube type: Oral Tube size: 8.5 mm Number of attempts: 1 Airway Equipment and Method: Stylet Placement Confirmation: ETT inserted through vocal cords under direct vision,  positive ETCO2 and breath sounds checked- equal and bilateral Secured at: 23 cm Tube secured with: Tape Dental Injury: Teeth and Oropharynx as per pre-operative assessment

## 2019-04-11 NOTE — Transfer of Care (Signed)
Immediate Anesthesia Transfer of Care Note  Patient: Derrick [name]  Procedure(s) Performed: VIDEO BRONCHOSCOPY WITH INSERTION OF INTERBRONCHIAL VALVE (IBV) (N/A )  Patient Location: PACU  Anesthesia Type:General  Level of Consciousness: awake, alert  and oriented  Airway & Oxygen Therapy: Patient Spontanous Breathing and Patient connected to face mask oxygen  Post-op Assessment: Report given to RN, Post -op Vital signs reviewed and stable and Patient moving all extremities X 4  Post vital signs: Reviewed and stable  Last Vitals:  Vitals Value Taken Time  BP 134/79 04/11/19 0844  Temp    Pulse 68 04/11/19 0848  Resp 18 04/11/19 0848  SpO2 100 % 04/11/19 0848  Vitals shown include unvalidated device data.  Last Pain:  Vitals:   04/11/19 0547  TempSrc: Oral  PainSc: 0-No pain      Patients Stated Pain Goal: 0 (91/47/82 9562)  Complications: No apparent anesthesia complications

## 2019-04-11 NOTE — Op Note (Signed)
      [address]       [place] [address]             [phone]        04/11/2019  Patient:  Derrick [name] Pre-Op Dx:   Severe bullous emphysema   Recurrent right pneumothorax   Persistent air leak   Post-op Dx:  same Procedure:  Endobronchial valve placement in segmental bronchi of the right upper lobe   Betadine chemical pleurodesis Surgeon and Role:      * Tyasia Packard, Lucile Crater, MD - Primary  Anesthesia  general EBL:  none Blood Administration: none   Indications: Derrick [name] 68 y.o. male with history of recurrent right sided pneumothorax presents with another event.  He was recently discharged for this same problem.  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    Findings: Good placement of the valves.  Reduction in air leak  Operative Technique: All invasive lines were placed in pre-op holding.  After the risks, benefits and alternatives were thoroughly discussed, the patient was brought to the operative theatre.  Anesthesia was induced, and the patient was prepped and draped in normal sterile fashion.  An appropriate surgical pause was performed.  Betadine was injected through the chest tube, and allowed to dwell.  The chest tube was then placed on suction.  There was a 1 column air leak present.  Flexible fiberoptic bronchoscopy was performed via the endotracheal tube.  It revealed normal endobronchial anatomy with no endobronchial lesions to the level of the subsegmental bronchi. The sizing balloon was placed and used to select appropriate valve sizes.  The bronchoscope then was directed to the right upper lobe segmental bronchi.  This measured for a 3mm, and 2 80mm valve, which were deployed.     After deploying the valves and waiting several minutes, the air leak decreased.  Final inspection was made with the bronchoscope and all valves appeared well seated.  There was no significant bleeding.  The bronchoscope was  removed.  The patient tolerated the procedure without any immediate complications, and was transferred to the PACU in good condition.  Derrick [name]

## 2019-04-12 LAB — BASIC METABOLIC PANEL
Anion gap: 10 (ref 5–15)
BUN: 22 mg/dL (ref 8–23)
CO2: 36 mmol/L — ABNORMAL HIGH (ref 22–32)
Calcium: 9.1 mg/dL (ref 8.9–10.3)
Chloride: 88 mmol/L — ABNORMAL LOW (ref 98–111)
Creatinine, Ser: 0.93 mg/dL (ref 0.61–1.24)
GFR calc Af Amer: >60 mL/min (ref >60)
GFR calc non Af Amer: >60 mL/min (ref >60)
Glucose, Bld: 82 mg/dL (ref 70–99)
Potassium: 5.4 mmol/L — ABNORMAL HIGH (ref 3.5–5.1)
Sodium: 134 mmol/L — ABNORMAL LOW (ref 135–145)

## 2019-04-12 LAB — CBC
HCT: 45.8 % (ref 39.0–52.0)
Hemoglobin: 14.4 g/dL (ref 13.0–17.0)
MCH: 31.4 pg (ref 26.0–34.0)
MCHC: 31.4 g/dL (ref 30.0–36.0)
MCV: 100 fL (ref 80.0–100.0)
Platelets: 234 10*3/uL (ref 150–400)
RBC: 4.58 MIL/uL (ref 4.22–5.81)
RDW: 11.9 % (ref 11.5–15.5)
WBC: 9.7 10*3/uL (ref 4.0–10.5)
nRBC: 0 % (ref 0.0–0.2)

## 2019-04-12 LAB — MAGNESIUM: Magnesium: 1.7 mg/dL (ref 1.7–2.4)

## 2019-04-12 NOTE — Progress Notes (Signed)
PROGRESS NOTE    Derrick [name]  DPO:[id number] DOB: [birth year] DOA: 04/09/2019 PCP: Lemmie Evens, MD   Brief Narrative:  68 y.o.malewith medical history significant forCOPD, pulmonary hypertension, heavy tobacco user, diastolic CHF and rheumatoid arthritis, with recurrent pneumothorax presents to any pain hospital initially with shortness of breath.  Found to have a moderate-sized right-sided pneumothorax.  Transferred here for further care management per CT surgery.  Underwent endobronchial valve placement 9/4.    Assessment & Plan:   Principal Problem:   Pneumothorax on right Active Problems:   Pulmonary HTN (HCC)   Acute respiratory failure with hypoxia (HCC)   Spontaneous pneumothorax   Protein-calorie malnutrition, severe   Chest tube in place   Status post chest tube placement (HCC)   Weakness generalized   Nicotine dependence, chewing tobacco, uncomplicated   RA (rheumatoid arthritis) (HCC)   CHF (congestive heart failure) (HCC)   COPD (chronic obstructive pulmonary disease) (HCC)  Acute hypoxic respiratory failure, 6 L nasal cannula, recurrent right-sided moderate pneumothorax Severe bullous emphysema - Status post endobronchial valve placement 9/4.  Chest tube currently in place per direction from CT surgery. -Aggressively continue use of incentive spirometer.  Supplemental oxygen as needed. - Plans to repeat x-ray likely tomorrow morning and then consider removing of the chest tube.  History of COPD with severe emphysema with active tobacco use - Not actively wheezing.  Continue bronchodilators  Chronic diastolic congestive heart failure with preserved ejection fraction, 60% -Continue home meds, no obvious signs of fluid overload echo in February 2020 showed ejection fraction 60%.  Severe protein calorie malnutrition -During previous admission he was seen by nutrition team recently  DVT prophylaxis: SCDs Code Status: DNR Family Communication: None at bedside  Disposition Plan: Maintain hospital stay as patient has endobronchial valve placed but still has a chest tube in place for now.  Consultants:   CT surgery  Procedures:   Endobronchial valve placement 9/4  Antimicrobials:   None   Subjective: No new complaints, no acute events overnight.  Still has chest tube in place.  Slight discomfort with movement but otherwise okay.  Review of Systems Otherwise negative except as per HPI, including: General = no fevers, chills, dizziness, malaise, fatigue HEENT/EYES = negative for pain, redness, loss of vision, double vision, blurred vision, loss of hearing, sore throat, hoarseness, dysphagia Cardiovascular= negative for chest pain, palpitation, murmurs, lower extremity swelling Respiratory/lungs= negative for shortness of breath, cough, hemoptysis, wheezing, mucus production Gastrointestinal= negative for nausea, vomiting,, abdominal pain, melena, hematemesis Genitourinary= negative for Dysuria, Hematuria, Change in Urinary Frequency MSK = Negative for arthralgia, myalgias, Back Pain, Joint swelling  Neurology= Negative for headache, seizures, numbness, tingling  Psychiatry= Negative for anxiety, depression, suicidal and homocidal ideation Allergy/Immunology= Medication/Food allergy as listed  Skin= Negative for Rash, lesions, ulcers, itching   Objective: Vitals:   04/11/19 2050 04/12/19 0518 04/12/19 0649 04/12/19 0840  BP: 91/72  101/76 102/90  Pulse: 77  73 72  Resp: 19   (!) 21  Temp: 98.3 F (36.8 C)  97.9 F (36.6 C) 98.9 F (37.2 C)  TempSrc: Oral  Oral Oral  SpO2: 96%   96%  Weight:  52.6 kg    Height:        Intake/Output Summary (Last 24 hours) at 04/12/2019 0938 Last data filed at 04/12/2019 0518 Gross per 24 hour  Intake 240 ml  Output 1195 ml  Net -955 ml   Filed Weights   04/09/19 0830 04/11/19 0547 04/12/19 0518  Weight:  54.4 kg 52.6 kg 52.6 kg    Examination: Constitutional: NAD, calm, comfortable; 5-6 L  Sunflower, cachectic frail Eyes: PERRL, lids and conjunctivae normal ENMT: Mucous membranes are moist. Posterior pharynx clear of any exudate or lesions.Normal dentition.  Neck: normal, supple, no masses, no thyromegaly Respiratory: Overall clear to auscultation bilaterally Cardiovascular: Regular rate and rhythm, no murmurs / rubs / gallops. No extremity edema. 2+ pedal pulses. No carotid bruits.  Abdomen: no tenderness, no masses palpated. No hepatosplenomegaly. Bowel sounds positive.  Musculoskeletal: no clubbing / cyanosis. No joint deformity upper and lower extremities. Good ROM, no contractures. Normal muscle tone.  Skin: no rashes, lesions, ulcers. No induration Neurologic: CN 2-12 grossly intact. Sensation intact, DTR normal. Strength 4/5 in all 4.  Psychiatric: Normal judgment and insight. Alert and oriented x 3. Normal mood.   Chest tube currently in place  Data Reviewed:   CBC: Recent Labs  Lab 04/09/19 0856 04/10/19 0256 04/11/19 0237 04/12/19 0332  WBC 5.4 9.0 8.1 9.7  HGB 13.2 13.0 12.6* 14.4  HCT 43.4 41.5 39.4 45.8  MCV 103.6* 100.7* 99.2 100.0  PLT 212 213 202 234   Basic Metabolic Panel: Recent Labs  Lab 04/09/19 0856 04/10/19 0256 04/11/19 0237 04/12/19 0332  NA 139 136 134* 134*  K 4.7 5.1 4.7 5.4*  CL 97* 93* 90* 88*  CO2 36* 33* 37* 36*  GLUCOSE 99 122* 92 82  BUN 17 19 15 22   CREATININE 0.73 0.69 0.72 0.93  CALCIUM 9.1 8.9 8.9 9.1  MG  --   --  1.6* 1.7   GFR: Estimated Creatinine Clearance: 58.1 mL/min (by C-G formula based on SCr of 0.93 mg/dL). Liver Function Tests: No results for input(s): AST, ALT, ALKPHOS, BILITOT, PROT, ALBUMIN in the last 168 hours. No results for input(s): LIPASE, AMYLASE in the last 168 hours. No results for input(s): AMMONIA in the last 168 hours. Coagulation Profile: No results for input(s): INR, PROTIME in the last 168 hours. Cardiac Enzymes: No results for input(s): CKTOTAL, CKMB, CKMBINDEX, TROPONINI in the last  168 hours. BNP (last 3 results) No results for input(s): PROBNP in the last 8760 hours. HbA1C: No results for input(s): HGBA1C in the last 72 hours. CBG: No results for input(s): GLUCAP in the last 168 hours. Lipid Profile: No results for input(s): CHOL, HDL, LDLCALC, TRIG, CHOLHDL, LDLDIRECT in the last 72 hours. Thyroid Function Tests: No results for input(s): TSH, T4TOTAL, FREET4, T3FREE, THYROIDAB in the last 72 hours. Anemia Panel: No results for input(s): VITAMINB12, FOLATE, FERRITIN, TIBC, IRON, RETICCTPCT in the last 72 hours. Sepsis Labs: No results for input(s): PROCALCITON, LATICACIDVEN in the last 168 hours.  Recent Results (from the past 240 hour(s))  SARS Coronavirus 2 [facility](Hospital order, Performed in [facility] hospital lab) Nasopharyngeal Nasopharyngeal Swab     Status: None   Collection Time: 04/09/19  8:42 AM   Specimen: Nasopharyngeal Swab  Result Value Ref Range Status   SARS Coronavirus 2 NEGATIVE NEGATIVE Final    Comment: (NOTE) If result is NEGATIVE SARS-CoV-2 target nucleic acids are NOT DETECTED. The SARS-CoV-2 RNA is generally detectable in upper and lower  respiratory specimens during the acute phase of infection. The lowest  concentration of SARS-CoV-2 viral copies this assay can detect is 250  copies / mL. A negative result does not preclude SARS-CoV-2 infection  and should not be used as the sole basis for treatment or other  patient management decisions.  A negative result may occur with  improper  specimen collection / handling, submission of specimen other  than nasopharyngeal swab, presence of viral mutation(s) within the  areas targeted by this assay, and inadequate number of viral copies  (<250 copies / mL). A negative result must be combined with clinical  observations, patient history, and epidemiological information. If result is POSITIVE SARS-CoV-2 target nucleic acids are DETECTED. The SARS-CoV-2 RNA is generally detectable in upper and  lower  respiratory specimens dur ing the acute phase of infection.  Positive  results are indicative of active infection with SARS-CoV-2.  Clinical  correlation with patient history and other diagnostic information is  necessary to determine patient infection status.  Positive results do  not rule out bacterial infection or co-infection with other viruses. If result is PRESUMPTIVE POSTIVE SARS-CoV-2 nucleic acids MAY BE PRESENT.   A presumptive positive result was obtained on the submitted specimen  and confirmed on repeat testing.  While 2019 novel coronavirus  (SARS-CoV-2) nucleic acids may be present in the submitted sample  additional confirmatory testing may be necessary for epidemiological  and / or clinical management purposes  to differentiate between  SARS-CoV-2 and other Sarbecovirus currently known to infect humans.  If clinically indicated additional testing with an alternate test  methodology [phone]) is advised. The SARS-CoV-2 RNA is generally  detectable in upper and lower respiratory sp ecimens during the acute  phase of infection. The expected result is Negative. Fact Sheet for Patients:  BoilerBrush.com.cy Fact Sheet for Healthcare Providers: https://pope.com/ This test is not yet approved or cleared by the [place] FDA and has been authorized for detection and/or diagnosis of SARS-CoV-2 by FDA under an Emergency Use Authorization (EUA).  This EUA will remain in effect (meaning this test can be used) for the duration of the COVID-19 declaration under Section 564(b)(1) of the Act, 21 U.S.C. section 360bbb-3(b)(1), unless the authorization is terminated or revoked sooner. Performed at [facility], [address] [place], [place] [address]   Surgical pcr screen     Status: None   Collection Time: 04/10/19 10:10 PM   Specimen: Nasal Mucosa; Nasal Swab  Result Value Ref Range Status   MRSA, PCR NEGATIVE NEGATIVE  Final   Staphylococcus aureus NEGATIVE NEGATIVE Final    Comment: (NOTE) The Xpert SA Assay (FDA approved for NASAL specimens in patients 81 years of age and older), is one component of a comprehensive surveillance program. It is not intended to diagnose infection nor to guide or monitor treatment. Performed at [name], [address], [place], [place] [address]          Radiology Studies: No results found.      Scheduled Meds: . acetaminophen  650 mg Oral Q6H   Or  . acetaminophen  650 mg Rectal Q6H  . carvedilol  3.125 mg Oral BID  . docusate sodium  100 mg Oral BID  . feeding supplement (ENSURE ENLIVE)  237 mL Oral TID BM  . fluticasone  2 spray Each Nare Daily  . furosemide  40 mg Oral Daily  . lisinopril  2.5 mg Oral Daily  . mouth rinse  15 mL Mouth Rinse BID  . multivitamin with minerals  1 tablet Oral Daily  . potassium chloride SA  20 mEq Oral Daily  . sodium chloride flush  3 mL Intravenous Q12H   Continuous Infusions: . sodium chloride       LOS: 3 days   Time spent= 35 mins    Stacey Sago Joline Maxcy, MD [facility]  If 7PM-7AM, please contact night-coverage [email or web address] 04/12/2019, 9:38 AM

## 2019-04-12 NOTE — Progress Notes (Addendum)
      [address]       [place] [address]             [phone]      1 Day Post-Op Procedure(s) (LRB): VIDEO BRONCHOSCOPY WITH INSERTION OF INTERBRONCHIAL VALVE (IBV) (N/A)   Subjective:  No complaints.  Denies pain  Objective: Vital signs in last 24 hours: Temp:  [97 F (36.1 C)-98.3 F (36.8 C)] 97.9 F (36.6 C) (09/05 0649) Pulse Rate:  [68-90] 73 (09/05 0649) Cardiac Rhythm: Normal sinus rhythm (09/05 0649) Resp:  [14-23] 19 (09/04 2050) BP: (91-134)/(72-97) 101/76 (09/05 0649) SpO2:  [90 %-100 %] 96 % (09/04 2050) Weight:  [52.6 kg] 52.6 kg (09/05 0518)  Intake/Output from previous day: 09/04 0701 - 09/05 0700 In: 740 [P.O.:240; I.V.:500] Out: 1195 [Urine:1175; Chest Tube:20]  General appearance: alert, cooperative and no distress Heart: regular rate and rhythm Lungs: diminished breath sounds bilaterally  Lab Results: Recent Labs    04/11/19 0237 04/12/19 0332  WBC 8.1 9.7  HGB 12.6* 14.4  HCT 39.4 45.8  PLT 202 234   BMET:  Recent Labs    04/11/19 0237 04/12/19 0332  NA 134* 134*  K 4.7 5.4*  CL 90* 88*  CO2 37* 36*  GLUCOSE 92 82  BUN 15 22  CREATININE 0.72 0.93  CALCIUM 8.9 9.1    PT/INR: No results for input(s): LABPROT, INR in the last 72 hours. ABG    Component Value Date/Time   PHART 7.369 03/25/2019 1753   HCO3 40.5 (H) 03/25/2019 1753   O2SAT 93.5 03/25/2019 1753   CBG (last 3)  No results for input(s): GLUCAP in the last 72 hours.  Assessment/Plan: S/P Procedure(s) (LRB): VIDEO BRONCHOSCOPY WITH INSERTION OF INTERBRONCHIAL VALVE (IBV) (N/A)  1. Chest tube- no air leak, leave on suction today... will get CXR in AM 2. Dispo- patient without air leak this morning, leave chest tube on suction, CXR in AM... care per primary   LOS: 3 days    Ellwood Handler 04/12/2019 Plan on leaving the pigtail chest tube to suction over the weekend and monitoring with chest x-rays.  Recovered from endobronchial valve  procedure well  patient examined and medical record reviewed,agree with above note. Tharon Aquas Trigt III 04/12/2019

## 2019-04-13 ENCOUNTER — Inpatient Hospital Stay (HOSPITAL_COMMUNITY): Payer: Medicare HMO

## 2019-04-13 LAB — CBC
HCT: 40.9 % (ref 39.0–52.0)
Hemoglobin: 13 g/dL (ref 13.0–17.0)
MCH: 31.4 pg (ref 26.0–34.0)
MCHC: 31.8 g/dL (ref 30.0–36.0)
MCV: 98.8 fL (ref 80.0–100.0)
Platelets: 199 10*3/uL (ref 150–400)
RBC: 4.14 MIL/uL — ABNORMAL LOW (ref 4.22–5.81)
RDW: 11.9 % (ref 11.5–15.5)
WBC: 7.6 10*3/uL (ref 4.0–10.5)
nRBC: 0 % (ref 0.0–0.2)

## 2019-04-13 LAB — BASIC METABOLIC PANEL
Anion gap: 7 (ref 5–15)
BUN: 22 mg/dL (ref 8–23)
CO2: 39 mmol/L — ABNORMAL HIGH (ref 22–32)
Calcium: 8.7 mg/dL — ABNORMAL LOW (ref 8.9–10.3)
Chloride: 88 mmol/L — ABNORMAL LOW (ref 98–111)
Creatinine, Ser: 0.78 mg/dL (ref 0.61–1.24)
GFR calc Af Amer: >60 mL/min (ref >60)
GFR calc non Af Amer: >60 mL/min (ref >60)
Glucose, Bld: 84 mg/dL (ref 70–99)
Potassium: 4.6 mmol/L (ref 3.5–5.1)
Sodium: 134 mmol/L — ABNORMAL LOW (ref 135–145)

## 2019-04-13 LAB — MAGNESIUM: Magnesium: 1.6 mg/dL — ABNORMAL LOW (ref 1.7–2.4)

## 2019-04-13 NOTE — Progress Notes (Addendum)
      [address]       ,[place] [address]             [phone]      2 Days Post-Op Procedure(s) (LRB): VIDEO BRONCHOSCOPY WITH INSERTION OF INTERBRONCHIAL VALVE (IBV) (N/A)   Subjective:  Doing okay.  Wanting breakfast, but states no one comes to take his order.  He is not getting very good things to eat.  He wants regular food.  Objective: Vital signs in last 24 hours: Temp:  [98.5 F (36.9 C)-98.9 F (37.2 C)] 98.6 F (37 C) (09/06 0545) Pulse Rate:  [72-93] 79 (09/06 0545) Cardiac Rhythm: Normal sinus rhythm (09/05 1948) Resp:  [16-25] 20 (09/06 0545) BP: (92-112)/(64-90) 112/75 (09/06 0545) SpO2:  [86 %-96 %] 95 % (09/06 0545) Weight:  [52.1 kg] 52.1 kg (09/06 0545)  Intake/Output from previous day: 09/05 0701 - 09/06 0700 In: 640 [P.O.:640] Out: 1130 [Urine:1115; Chest Tube:15]  General appearance: alert, cooperative and no distress Heart: regular rate and rhythm Lungs: clear to auscultation bilaterally Abdomen: soft, non-tender; bowel sounds normal; no masses,  no organomegaly Extremities: extremities normal, atraumatic, no cyanosis or edema Wound: clean and dry  Lab Results: Recent Labs    04/12/19 0332 04/13/19 0419  WBC 9.7 7.6  HGB 14.4 13.0  HCT 45.8 40.9  PLT 234 199   BMET:  Recent Labs    04/12/19 0332 04/13/19 0419  NA 134* 134*  K 5.4* 4.6  CL 88* 88*  CO2 36* 39*  GLUCOSE 82 84  BUN 22 22  CREATININE 0.93 0.78  CALCIUM 9.1 8.7*    PT/INR: No results for input(s): LABPROT, INR in the last 72 hours. ABG    Component Value Date/Time   PHART 7.369 03/25/2019 1753   HCO3 40.5 (H) 03/25/2019 1753   O2SAT 93.5 03/25/2019 1753   CBG (last 3)  No results for input(s): GLUCAP in the last 72 hours.  Assessment/Plan: S/P Procedure(s) (LRB): VIDEO BRONCHOSCOPY WITH INSERTION OF INTERBRONCHIAL VALVE (IBV) (N/A)  1. Chest tube- no air leak, will decrease suction to 20 cm, leave chest tube in place, CXR remains  stable  2. Dispo- patient stable, decrease suction to 20 cm, repeat CXR in AM, care per primary   LOS: 4 days    Erin Barrett 04/13/2019   CXR is good, no leak from tube Decrease suction slowlyn to 10 cm patient examined and medical record reviewed,agree with above note. Tharon Aquas Trigt III 04/13/2019

## 2019-04-13 NOTE — Progress Notes (Signed)
PROGRESS NOTE    Derrick [name]  VQQ:[id number] DOB: 16-Dec-1950 DOA: 04/09/2019 PCP: Gareth Morgan, MD   Brief Narrative:  68 y.o.malewith medical history significant forCOPD, pulmonary hypertension, heavy tobacco user, diastolic CHF and rheumatoid arthritis, with recurrent pneumothorax presents to any pain hospital initially with shortness of breath.  Found to have a moderate-sized right-sided pneumothorax.  Transferred here for further care management per CT surgery.  Underwent endobronchial valve placement 9/4.    Assessment & Plan:   Principal Problem:   Pneumothorax on right Active Problems:   Pulmonary HTN (HCC)   Acute respiratory failure with hypoxia (HCC)   Spontaneous pneumothorax   Protein-calorie malnutrition, severe   Chest tube in place   Status post chest tube placement (HCC)   Weakness generalized   Nicotine dependence, chewing tobacco, uncomplicated   RA (rheumatoid arthritis) (HCC)   CHF (congestive heart failure) (HCC)   COPD (chronic obstructive pulmonary disease) (HCC)  Acute hypoxic respiratory failure, 6 L nasal cannula, recurrent right-sided moderate pneumothorax Severe bullous emphysema - Status post endobronchial valve placement 9/4.  Chest tube currently in place per direction from CT surgery. -Chest tube management per CT surgery  History of COPD with severe emphysema with active tobacco use - Not actively wheezing.  Continue bronchodilators  Chronic diastolic congestive heart failure with preserved ejection fraction, 60% -Continue home meds, no obvious signs of fluid overload echo in February 2020 showed ejection fraction 60%.  Severe protein calorie malnutrition -During previous admission he was seen by nutrition team recently  DVT prophylaxis: SCDs Code Status: DNR Family Communication: None at bedside Disposition Plan: Maintain hospital stay until chest tube removed by CT surgery  Consultants:   CT surgery  Procedures:    Endobronchial valve placement 9/4  Antimicrobials:   None   Subjective: Feels ok, no new complaints.   Review of Systems Otherwise negative except as per HPI, including: General = no fevers, chills, dizziness, malaise, fatigue HEENT/EYES = negative for pain, redness, loss of vision, double vision, blurred vision, loss of hearing, sore throat, hoarseness, dysphagia Cardiovascular= negative for chest pain, palpitation, murmurs, lower extremity swelling Respiratory/lungs= negative for shortness of breath, cough, hemoptysis, wheezing, mucus production Gastrointestinal= negative for nausea, vomiting,, abdominal pain, melena, hematemesis Genitourinary= negative for Dysuria, Hematuria, Change in Urinary Frequency MSK = Negative for arthralgia, myalgias, Back Pain, Joint swelling  Neurology= Negative for headache, seizures, numbness, tingling  Psychiatry= Negative for anxiety, depression, suicidal and homocidal ideation Allergy/Immunology= Medication/Food allergy as listed  Skin= Negative for Rash, lesions, ulcers, itching   Objective: Vitals:   04/12/19 2000 04/12/19 2206 04/13/19 0545 04/13/19 1015  BP:  104/78 112/75 112/79  Pulse: 80 79 79 82  Resp: 19 20 20    Temp:   98.6 F (37 C)   TempSrc:   Oral   SpO2: 93% 96% 95%   Weight:   52.1 kg   Height:        Intake/Output Summary (Last 24 hours) at 04/13/2019 1022 Last data filed at 04/13/2019 0726 Gross per 24 hour  Intake 440 ml  Output 1130 ml  Net -690 ml   Filed Weights   04/11/19 0547 04/12/19 0518 04/13/19 0545  Weight: 52.6 kg 52.6 kg 52.1 kg    Examination: Constitutional: NAD, calm, comfortable, cachectic frail 5 L nasal cannula, bilateral temporal wasting Eyes: PERRL, lids and conjunctivae normal ENMT: Mucous membranes are moist. Posterior pharynx clear of any exudate or lesions.Normal dentition.  Neck: normal, supple, no masses, no thyromegaly Respiratory: Diffuse  diminished breath sounds Cardiovascular:  Regular rate and rhythm, no murmurs / rubs / gallops. No extremity edema. 2+ pedal pulses. No carotid bruits.  Abdomen: no tenderness, no masses palpated. No hepatosplenomegaly. Bowel sounds positive.  Musculoskeletal: no clubbing / cyanosis. No joint deformity upper and lower extremities. Good ROM, no contractures. Normal muscle tone.  Skin: no rashes, lesions, ulcers. No induration Neurologic: CN 2-12 grossly intact. Sensation intact, DTR normal. Strength 5/5 in all 4.  Psychiatric: Normal judgment and insight. Alert and oriented x 3. Normal mood.   Chest tube currently in place  Data Reviewed:   CBC: Recent Labs  Lab 04/09/19 0856 04/10/19 0256 04/11/19 0237 04/12/19 0332 04/13/19 0419  WBC 5.4 9.0 8.1 9.7 7.6  HGB 13.2 13.0 12.6* 14.4 13.0  HCT 43.4 41.5 39.4 45.8 40.9  MCV 103.6* 100.7* 99.2 100.0 98.8  PLT 212 213 202 234 199   Basic Metabolic Panel: Recent Labs  Lab 04/09/19 0856 04/10/19 0256 04/11/19 0237 04/12/19 0332 04/13/19 0419  NA 139 136 134* 134* 134*  K 4.7 5.1 4.7 5.4* 4.6  CL 97* 93* 90* 88* 88*  CO2 36* 33* 37* 36* 39*  GLUCOSE 99 122* 92 82 84  BUN 17 19 15 22 22   CREATININE 0.73 0.69 0.72 0.93 0.78  CALCIUM 9.1 8.9 8.9 9.1 8.7*  MG  --   --  1.6* 1.7 1.6*   GFR: Estimated Creatinine Clearance: 66.9 mL/min (by C-G formula based on SCr of 0.78 mg/dL). Liver Function Tests: No results for input(s): AST, ALT, ALKPHOS, BILITOT, PROT, ALBUMIN in the last 168 hours. No results for input(s): LIPASE, AMYLASE in the last 168 hours. No results for input(s): AMMONIA in the last 168 hours. Coagulation Profile: No results for input(s): INR, PROTIME in the last 168 hours. Cardiac Enzymes: No results for input(s): CKTOTAL, CKMB, CKMBINDEX, TROPONINI in the last 168 hours. BNP (last 3 results) No results for input(s): PROBNP in the last 8760 hours. HbA1C: No results for input(s): HGBA1C in the last 72 hours. CBG: No results for input(s): GLUCAP in the  last 168 hours. Lipid Profile: No results for input(s): CHOL, HDL, LDLCALC, TRIG, CHOLHDL, LDLDIRECT in the last 72 hours. Thyroid Function Tests: No results for input(s): TSH, T4TOTAL, FREET4, T3FREE, THYROIDAB in the last 72 hours. Anemia Panel: No results for input(s): VITAMINB12, FOLATE, FERRITIN, TIBC, IRON, RETICCTPCT in the last 72 hours. Sepsis Labs: No results for input(s): PROCALCITON, LATICACIDVEN in the last 168 hours.  Recent Results (from the past 240 hour(s))  SARS Coronavirus 2 [facility] order, Performed in [facility] hospital lab) Nasopharyngeal Nasopharyngeal Swab     Status: None   Collection Time: 04/09/19  8:42 AM   Specimen: Nasopharyngeal Swab  Result Value Ref Range Status   SARS Coronavirus 2 NEGATIVE NEGATIVE Final    Comment: (NOTE) If result is NEGATIVE SARS-CoV-2 target nucleic acids are NOT DETECTED. The SARS-CoV-2 RNA is generally detectable in upper and lower  respiratory specimens during the acute phase of infection. The lowest  concentration of SARS-CoV-2 viral copies this assay can detect is 250  copies / mL. A negative result does not preclude SARS-CoV-2 infection  and should not be used as the sole basis for treatment or other  patient management decisions.  A negative result may occur with  improper specimen collection / handling, submission of specimen other  than nasopharyngeal swab, presence of viral mutation(s) within the  areas targeted by this assay, and inadequate number of viral copies  (<250  copies / mL). A negative result must be combined with clinical  observations, patient history, and epidemiological information. If result is POSITIVE SARS-CoV-2 target nucleic acids are DETECTED. The SARS-CoV-2 RNA is generally detectable in upper and lower  respiratory specimens dur ing the acute phase of infection.  Positive  results are indicative of active infection with SARS-CoV-2.  Clinical  correlation with patient history and other  diagnostic information is  necessary to determine patient infection status.  Positive results do  not rule out bacterial infection or co-infection with other viruses. If result is PRESUMPTIVE POSTIVE SARS-CoV-2 nucleic acids MAY BE PRESENT.   A presumptive positive result was obtained on the submitted specimen  and confirmed on repeat testing.  While 2019 novel coronavirus  (SARS-CoV-2) nucleic acids may be present in the submitted sample  additional confirmatory testing may be necessary for epidemiological  and / or clinical management purposes  to differentiate between  SARS-CoV-2 and other Sarbecovirus currently known to infect humans.  If clinically indicated additional testing with an alternate test  methodology [phone]) is advised. The SARS-CoV-2 RNA is generally  detectable in upper and lower respiratory sp ecimens during the acute  phase of infection. The expected result is Negative. Fact Sheet for Patients:  StrictlyIdeas.no Fact Sheet for Healthcare Providers: BankingDealers.co.za This test is not yet approved or cleared by the [place] FDA and has been authorized for detection and/or diagnosis of SARS-CoV-2 by FDA under an Emergency Use Authorization (EUA).  This EUA will remain in effect (meaning this test can be used) for the duration of the COVID-19 declaration under Section 564(b)(1) of the Act, 21 U.S.C. section 360bbb-3(b)(1), unless the authorization is terminated or revoked sooner. Performed at [facility], [address], [place], [place] [address]   Surgical pcr screen     Status: None   Collection Time: 04/10/19 10:10 PM   Specimen: Nasal Mucosa; Nasal Swab  Result Value Ref Range Status   MRSA, PCR NEGATIVE NEGATIVE Final   Staphylococcus aureus NEGATIVE NEGATIVE Final    Comment: (NOTE) The Xpert SA Assay (FDA approved for NASAL specimens in patients 70 years of age and older), is one component of a  comprehensive surveillance program. It is not intended to diagnose infection nor to guide or monitor treatment. Performed at [facility], [place] [address], [place], [place] [address]          Radiology Studies: No results found.      Scheduled Meds: . acetaminophen  650 mg Oral Q6H   Or  . acetaminophen  650 mg Rectal Q6H  . carvedilol  3.125 mg Oral BID  . docusate sodium  100 mg Oral BID  . feeding supplement (ENSURE ENLIVE)  237 mL Oral TID BM  . fluticasone  2 spray Each Nare Daily  . furosemide  40 mg Oral Daily  . lisinopril  2.5 mg Oral Daily  . mouth rinse  15 mL Mouth Rinse BID  . multivitamin with minerals  1 tablet Oral Daily  . potassium chloride SA  20 mEq Oral Daily  . sodium chloride flush  3 mL Intravenous Q12H   Continuous Infusions: . sodium chloride       LOS: 4 days   Time spent= 25 mins    Jacai Kipp Arsenio Loader, MD [facility]  If 7PM-7AM, please contact night-coverage [email or web address] 04/13/2019, 10:22 AM

## 2019-04-14 ENCOUNTER — Inpatient Hospital Stay (HOSPITAL_COMMUNITY): Payer: Medicare HMO

## 2019-04-14 LAB — CBC
HCT: 39 % (ref 39.0–52.0)
Hemoglobin: 12.4 g/dL — ABNORMAL LOW (ref 13.0–17.0)
MCH: 31.2 pg (ref 26.0–34.0)
MCHC: 31.8 g/dL (ref 30.0–36.0)
MCV: 98.2 fL (ref 80.0–100.0)
Platelets: 202 10*3/uL (ref 150–400)
RBC: 3.97 MIL/uL — ABNORMAL LOW (ref 4.22–5.81)
RDW: 11.8 % (ref 11.5–15.5)
WBC: 6.9 10*3/uL (ref 4.0–10.5)
nRBC: 0 % (ref 0.0–0.2)

## 2019-04-14 LAB — BASIC METABOLIC PANEL
Anion gap: 12 (ref 5–15)
BUN: 15 mg/dL (ref 8–23)
CO2: 35 mmol/L — ABNORMAL HIGH (ref 22–32)
Calcium: 8.8 mg/dL — ABNORMAL LOW (ref 8.9–10.3)
Chloride: 88 mmol/L — ABNORMAL LOW (ref 98–111)
Creatinine, Ser: 0.61 mg/dL (ref 0.61–1.24)
GFR calc Af Amer: >60 mL/min (ref >60)
GFR calc non Af Amer: >60 mL/min (ref >60)
Glucose, Bld: 80 mg/dL (ref 70–99)
Potassium: 4.4 mmol/L (ref 3.5–5.1)
Sodium: 135 mmol/L (ref 135–145)

## 2019-04-14 LAB — MAGNESIUM: Magnesium: 1.6 mg/dL — ABNORMAL LOW (ref 1.7–2.4)

## 2019-04-14 LAB — BRAIN NATRIURETIC PEPTIDE: B Natriuretic Peptide: 81.5 pg/mL (ref 0.0–100.0)

## 2019-04-14 NOTE — Care Management Important Message (Signed)
Important Message  Patient Details  Name: Derrick [name] MRN: [id number] Date of Birth: [birth year]   Medicare Important Message Given:  Yes     [name] 04/14/2019, 3:31 PM

## 2019-04-14 NOTE — Progress Notes (Addendum)
      [address]       [place] [address]             [phone]      3 Days Post-Op Procedure(s) (LRB): VIDEO BRONCHOSCOPY WITH INSERTION OF INTERBRONCHIAL VALVE (IBV) (N/A)  Subjective:  Some pain at chest tube site, mild shortness of breath.  Objective: Vital signs in last 24 hours: Temp:  [97.9 F (36.6 C)-98.5 F (36.9 C)] 98.1 F (36.7 C) (09/07 0601) Pulse Rate:  [71-93] 74 (09/07 0601) Cardiac Rhythm: Normal sinus rhythm (09/06 2200) Resp:  [13-23] 13 (09/07 0601) BP: (103-125)/(75-83) 125/83 (09/07 0601) SpO2:  [89 %-98 %] 97 % (09/07 0601) Weight:  [51.8 kg] 51.8 kg (09/07 0601)  Intake/Output from previous day: 09/06 0701 - 09/07 0700 In: -  Out: [place] appearance: alert, cooperative and no distress Heart: regular rate and rhythm Lungs: clear to auscultation bilaterally Wound: clean and dry  Lab Results: Recent Labs    04/13/19 0419 04/14/19 0036  WBC 7.6 6.9  HGB 13.0 12.4*  HCT 40.9 39.0  PLT 199 202   BMET:  Recent Labs    04/13/19 0419 04/14/19 0036  NA 134* 135  K 4.6 4.4  CL 88* 88*  CO2 39* 35*  GLUCOSE 84 80  BUN 22 15  CREATININE 0.78 0.61  CALCIUM 8.7* 8.8*    PT/INR: No results for input(s): LABPROT, INR in the last 72 hours. ABG    Component Value Date/Time   PHART 7.369 03/25/2019 1753   HCO3 40.5 (H) 03/25/2019 1753   O2SAT 93.5 03/25/2019 1753   CBG (last 3)  No results for input(s): GLUCAP in the last 72 hours.  Assessment/Plan: S/P Procedure(s) (LRB): VIDEO BRONCHOSCOPY WITH INSERTION OF INTERBRONCHIAL VALVE (IBV) (N/A)  1. Chest tube- down to 20 cm suction, no air leak present 2. Pulm- CXR shows no evidence of pneumothorax, 3. Dispo- wean suction down to 10 cm, leave chest tube in place, will repeat CXR in AM if remains stable can possibly try water seal tomorrow, care per primary   LOS: 5 days    Erin Barrett patient examined and medical record reviewed,agree with  above note. Tharon Aquas Trigt III 04/14/2019

## 2019-04-14 NOTE — Progress Notes (Signed)
PROGRESS NOTE    Derrick [name]  VHQ:[id number] DOB: [birth year] DOA: 04/09/2019 PCP: Gareth Morgan, MD   Brief Narrative:  68 y.o.malewith medical history significant forCOPD, pulmonary hypertension, heavy tobacco user, diastolic CHF and rheumatoid arthritis, with recurrent pneumothorax presents to any pain hospital initially with shortness of breath.  Found to have a moderate-sized right-sided pneumothorax.  Transferred here for further care management per CT surgery.  Underwent endobronchial valve placement 9/4.    Assessment & Plan:   Principal Problem:   Pneumothorax on right Active Problems:   Pulmonary HTN (HCC)   Acute respiratory failure with hypoxia (HCC)   Spontaneous pneumothorax   Protein-calorie malnutrition, severe   Chest tube in place   Status post chest tube placement (HCC)   Weakness generalized   Nicotine dependence, chewing tobacco, uncomplicated   RA (rheumatoid arthritis) (HCC)   CHF (congestive heart failure) (HCC)   COPD (chronic obstructive pulmonary disease) (HCC)  Acute hypoxic respiratory failure, 6 L nasal cannula, recurrent right-sided moderate pneumothorax Severe bullous emphysema - Status post endobronchial valve placement 9/4.  Chest tube management per CT surgery  History of COPD with severe emphysema with active tobacco use - Not actively wheezing.  Continue bronchodilators  Chronic diastolic congestive heart failure with preserved ejection fraction, 60% -Continue home meds, no obvious signs of fluid overload echo in February 2020 showed ejection fraction 60%.  Severe protein calorie malnutrition -During previous admission he was seen by nutrition team recently  DVT prophylaxis: SCDs Code Status: DNR Family Communication: None at bedside Disposition Plan: Maintain hospital stay until chest tube is in place  Consultants:   CT surgery  Procedures:   Endobronchial valve placement 9/4  Antimicrobials:   None   Subjective: No  complaints, no acute events overnight.  Review of Systems Otherwise negative except as per HPI, including: General = no fevers, chills, dizziness, malaise, fatigue HEENT/EYES = negative for pain, redness, loss of vision, double vision, blurred vision, loss of hearing, sore throat, hoarseness, dysphagia Cardiovascular= negative for chest pain, palpitation, murmurs, lower extremity swelling Respiratory/lungs= negative for shortness of breath, cough, hemoptysis, wheezing, mucus production Gastrointestinal= negative for nausea, vomiting,, abdominal pain, melena, hematemesis Genitourinary= negative for Dysuria, Hematuria, Change in Urinary Frequency MSK = Negative for arthralgia, myalgias, Back Pain, Joint swelling  Neurology= Negative for headache, seizures, numbness, tingling  Psychiatry= Negative for anxiety, depression, suicidal and homocidal ideation Allergy/Immunology= Medication/Food allergy as listed  Skin= Negative for Rash, lesions, ulcers, itching   Objective: Vitals:   04/13/19 1900 04/13/19 2026 04/14/19 0601 04/14/19 0944  BP:  103/79 125/83 110/78  Pulse: 83 81 74 81  Resp: 19 14 13 19   Temp:  97.9 F (36.6 C) 98.1 F (36.7 C) 98 F (36.7 C)  TempSrc:  Oral Oral Oral  SpO2: 94% 98% 97% 93%  Weight:   51.8 kg   Height:        Intake/Output Summary (Last 24 hours) at 04/14/2019 1121 Last data filed at 04/14/2019 0900 Gross per 24 hour  Intake 110 ml  Output 1075 ml  Net -965 ml   Filed Weights   04/12/19 0518 04/13/19 0545 04/14/19 0601  Weight: 52.6 kg 52.1 kg 51.8 kg    Examination: Constitutional: NAD, calm, comfortable, cachectic frail appearing on 6 L nasal cannula Eyes: PERRL, lids and conjunctivae normal ENMT: Mucous membranes are moist. Posterior pharynx clear of any exudate or lesions.Normal dentition.  Neck: normal, supple, no masses, no thyromegaly Respiratory: Diminished breath sounds bilaterally Cardiovascular: Regular rate  and rhythm, no murmurs /  rubs / gallops. No extremity edema. 2+ pedal pulses. No carotid bruits.  Abdomen: no tenderness, no masses palpated. No hepatosplenomegaly. Bowel sounds positive.  Musculoskeletal: no clubbing / cyanosis. No joint deformity upper and lower extremities. Good ROM, no contractures. Normal muscle tone.  Skin: no rashes, lesions, ulcers. No induration Neurologic: CN 2-12 grossly intact. Sensation intact, DTR normal. Strength 4/5 in all 4.  Psychiatric: Normal judgment and insight. Alert and oriented x 3. Normal mood.   Chest tube currently in place  Data Reviewed:   CBC: Recent Labs  Lab 04/10/19 0256 04/11/19 0237 04/12/19 0332 04/13/19 0419 04/14/19 0036  WBC 9.0 8.1 9.7 7.6 6.9  HGB 13.0 12.6* 14.4 13.0 12.4*  HCT 41.5 39.4 45.8 40.9 39.0  MCV 100.7* 99.2 100.0 98.8 98.2  PLT 213 202 234 199 244   Basic Metabolic Panel: Recent Labs  Lab 04/10/19 0256 04/11/19 0237 04/12/19 0332 04/13/19 0419 04/14/19 0036  NA 136 134* 134* 134* 135  K 5.1 4.7 5.4* 4.6 4.4  CL 93* 90* 88* 88* 88*  CO2 33* 37* 36* 39* 35*  GLUCOSE 122* 92 82 84 80  BUN 19 15 22 22 15   CREATININE 0.69 0.72 0.93 0.78 0.61  CALCIUM 8.9 8.9 9.1 8.7* 8.8*  MG  --  1.6* 1.7 1.6* 1.6*   GFR: Estimated Creatinine Clearance: 66.5 mL/min (by C-G formula based on SCr of 0.61 mg/dL). Liver Function Tests: No results for input(s): AST, ALT, ALKPHOS, BILITOT, PROT, ALBUMIN in the last 168 hours. No results for input(s): LIPASE, AMYLASE in the last 168 hours. No results for input(s): AMMONIA in the last 168 hours. Coagulation Profile: No results for input(s): INR, PROTIME in the last 168 hours. Cardiac Enzymes: No results for input(s): CKTOTAL, CKMB, CKMBINDEX, TROPONINI in the last 168 hours. BNP (last 3 results) No results for input(s): PROBNP in the last 8760 hours. HbA1C: No results for input(s): HGBA1C in the last 72 hours. CBG: No results for input(s): GLUCAP in the last 168 hours. Lipid Profile: No  results for input(s): CHOL, HDL, LDLCALC, TRIG, CHOLHDL, LDLDIRECT in the last 72 hours. Thyroid Function Tests: No results for input(s): TSH, T4TOTAL, FREET4, T3FREE, THYROIDAB in the last 72 hours. Anemia Panel: No results for input(s): VITAMINB12, FOLATE, FERRITIN, TIBC, IRON, RETICCTPCT in the last 72 hours. Sepsis Labs: No results for input(s): PROCALCITON, LATICACIDVEN in the last 168 hours.  Recent Results (from the past 240 hour(s))  SARS Coronavirus 2 [facility] order, Performed in [facility] hospital lab) Nasopharyngeal Nasopharyngeal Swab     Status: None   Collection Time: 04/09/19  8:42 AM   Specimen: Nasopharyngeal Swab  Result Value Ref Range Status   SARS Coronavirus 2 NEGATIVE NEGATIVE Final    Comment: (NOTE) If result is NEGATIVE SARS-CoV-2 target nucleic acids are NOT DETECTED. The SARS-CoV-2 RNA is generally detectable in upper and lower  respiratory specimens during the acute phase of infection. The lowest  concentration of SARS-CoV-2 viral copies this assay can detect is 250  copies / mL. A negative result does not preclude SARS-CoV-2 infection  and should not be used as the sole basis for treatment or other  patient management decisions.  A negative result may occur with  improper specimen collection / handling, submission of specimen other  than nasopharyngeal swab, presence of viral mutation(s) within the  areas targeted by this assay, and inadequate number of viral copies  (<250 copies / mL). A negative result must be  combined with clinical  observations, patient history, and epidemiological information. If result is POSITIVE SARS-CoV-2 target nucleic acids are DETECTED. The SARS-CoV-2 RNA is generally detectable in upper and lower  respiratory specimens dur ing the acute phase of infection.  Positive  results are indicative of active infection with SARS-CoV-2.  Clinical  correlation with patient history and other diagnostic information is  necessary to  determine patient infection status.  Positive results do  not rule out bacterial infection or co-infection with other viruses. If result is PRESUMPTIVE POSTIVE SARS-CoV-2 nucleic acids MAY BE PRESENT.   A presumptive positive result was obtained on the submitted specimen  and confirmed on repeat testing.  While 2019 novel coronavirus  (SARS-CoV-2) nucleic acids may be present in the submitted sample  additional confirmatory testing may be necessary for epidemiological  and / or clinical management purposes  to differentiate between  SARS-CoV-2 and other Sarbecovirus currently known to infect humans.  If clinically indicated additional testing with an alternate test  methodology [phone] is advised. The SARS-CoV-2 RNA is generally  detectable in upper and lower respiratory sp ecimens during the acute  phase of infection. The expected result is Negative. Fact Sheet for Patients:  BoilerBrush.com.cyhttps://www.fda.gov/media/136312/download Fact Sheet for Healthcare Providers: https://pope.com/https://www.fda.gov/media/136313/download This test is not yet approved or cleared by the [place] FDA and has been authorized for detection and/or diagnosis of SARS-CoV-2 by FDA under an Emergency Use Authorization (EUA).  This EUA will remain in effect (meaning this test can be used) for the duration of the COVID-19 declaration under Section 564(b)(1) of the Act, 21 U.S.C. section 360bbb-3(b)(1), unless the authorization is terminated or revoked sooner. Performed at [facility], [address] [place], [place] [address]   Surgical pcr screen     Status: None   Collection Time: 04/10/19 10:10 PM   Specimen: Nasal Mucosa; Nasal Swab  Result Value Ref Range Status   MRSA, PCR NEGATIVE NEGATIVE Final   Staphylococcus aureus NEGATIVE NEGATIVE Final    Comment: (NOTE) The Xpert SA Assay (FDA approved for NASAL specimens in patients 66 years of age and older), is one component of a comprehensive surveillance program. It is not  intended to diagnose infection nor to guide or monitor treatment. Performed at [facility], [address] [place], [place] [address]          Radiology Studies: Dg Chest [facility] Incort 1 View  Result Date: 04/14/2019 CLINICAL DATA:  History of right pneumothorax.  Chest tube in place. EXAM: PORTABLE CHEST 1 VIEW COMPARISON:  Single-view of the chest 04/13/2019 and 04/10/2019. FINDINGS: Right chest tube remains in place, unchanged. No pneumothorax. Lungs are emphysematous. Right basilar airspace opacity is mildly improved compared to yesterday's examination. Heart size is normal. Atherosclerosis. IMPRESSION: Negative for right pneumothorax with a chest tube in place. Right basilar airspace disease has mildly improved since yesterday's examination. Emphysema. Atherosclerosis. Electronically Signed   By: Drusilla Kannerhomas  Dalessio M.D.   On: 04/14/2019 10:50   Dg Chest Port 1 View  Result Date: 04/13/2019 CLINICAL DATA:  Reason for exam: rt pneumothorax EXAM: PORTABLE CHEST 1 VIEW COMPARISON:  04/10/2019 FINDINGS: RIGHT-sided chest tube, tip at the apex is in unchanged. Interval placement of endobronchial valves in the RIGHT hilar region. Increased opacity within the RIGHT lung base and associated pleural effusion. Stable lucency at the RIGHT lung apex. LEFT lung is stable and clear. IMPRESSION: 1. Interval placement of endobronchial valves in the RIGHT hilar region. 2. Increased opacity at the RIGHT lung base  and associated pleural effusion. Electronically Signed   By: Norva Pavlov M.D.   On: 04/13/2019 10:26        Scheduled Meds: . acetaminophen  650 mg Oral Q6H   Or  . acetaminophen  650 mg Rectal Q6H  . carvedilol  3.125 mg Oral BID  . docusate sodium  100 mg Oral BID  . feeding supplement (ENSURE ENLIVE)  237 mL Oral TID BM  . fluticasone  2 spray Each Nare Daily  . furosemide  40 mg Oral Daily  . lisinopril  2.5 mg Oral Daily  . mouth rinse  15 mL Mouth Rinse BID  . multivitamin with  minerals  1 tablet Oral Daily  . potassium chloride SA  20 mEq Oral Daily  . sodium chloride flush  3 mL Intravenous Q12H   Continuous Infusions: . sodium chloride       LOS: 5 days   Time spent= 25 mins    Ikechukwu Cerny Joline Maxcy, MD [facility]  If 7PM-7AM, please contact night-coverage [email or web address] 04/14/2019, 11:21 AM

## 2019-04-15 ENCOUNTER — Encounter (HOSPITAL_COMMUNITY): Payer: Self-pay | Admitting: Thoracic Surgery (Cardiothoracic Vascular Surgery)

## 2019-04-15 ENCOUNTER — Inpatient Hospital Stay (HOSPITAL_COMMUNITY): Payer: Medicare HMO

## 2019-04-15 LAB — CBC
HCT: 39.3 % (ref 39.0–52.0)
Hemoglobin: 12.6 g/dL — ABNORMAL LOW (ref 13.0–17.0)
MCH: 31.7 pg (ref 26.0–34.0)
MCHC: 32.1 g/dL (ref 30.0–36.0)
MCV: 98.7 fL (ref 80.0–100.0)
Platelets: 199 10*3/uL (ref 150–400)
RBC: 3.98 MIL/uL — ABNORMAL LOW (ref 4.22–5.81)
RDW: 11.9 % (ref 11.5–15.5)
WBC: 6.7 10*3/uL (ref 4.0–10.5)
nRBC: 0 % (ref 0.0–0.2)

## 2019-04-15 LAB — BASIC METABOLIC PANEL
Anion gap: 5 (ref 5–15)
BUN: 12 mg/dL (ref 8–23)
CO2: 40 mmol/L — ABNORMAL HIGH (ref 22–32)
Calcium: 9 mg/dL (ref 8.9–10.3)
Chloride: 90 mmol/L — ABNORMAL LOW (ref 98–111)
Creatinine, Ser: 0.58 mg/dL — ABNORMAL LOW (ref 0.61–1.24)
GFR calc Af Amer: >60 mL/min (ref >60)
GFR calc non Af Amer: >60 mL/min (ref >60)
Glucose, Bld: 80 mg/dL (ref 70–99)
Potassium: 4.3 mmol/L (ref 3.5–5.1)
Sodium: 135 mmol/L (ref 135–145)

## 2019-04-15 LAB — MAGNESIUM: Magnesium: 1.7 mg/dL (ref 1.7–2.4)

## 2019-04-15 MED ORDER — LIDOCAINE 5 % EX PTCH
1.0000 | MEDICATED_PATCH | CUTANEOUS | Status: DC
  Administered 2019-04-15 – 2019-04-16 (×2): 1 via TRANSDERMAL
  Filled 2019-04-15 (×5): qty 1

## 2019-04-15 NOTE — Progress Notes (Signed)
PROGRESS NOTE    Derrick [name]  ZOX:[id number] DOB: 1951/05/11 DOA: 04/09/2019 PCP: Gareth MorganKnowlton, Steve, MD   Brief Narrative:  68 y.o.malewith medical history significant forCOPD, pulmonary hypertension, heavy tobacco user, diastolic CHF and rheumatoid arthritis, with recurrent pneumothorax presents to any pain hospital initially with shortness of breath.  Found to have a moderate-sized right-sided pneumothorax.  Transferred here for further care management per CT surgery.  Underwent endobronchial valve placement 9/4.    Assessment & Plan:   Principal Problem:   Pneumothorax on right Active Problems:   Pulmonary HTN (HCC)   Acute respiratory failure with hypoxia (HCC)   Spontaneous pneumothorax   Protein-calorie malnutrition, severe   Chest tube in place   Status post chest tube placement (HCC)   Weakness generalized   Nicotine dependence, chewing tobacco, uncomplicated   RA (rheumatoid arthritis) (HCC)   CHF (congestive heart failure) (HCC)   COPD (chronic obstructive pulmonary disease) (HCC)  Acute hypoxic respiratory failure, 6 L nasal cannula, recurrent right-sided moderate pneumothorax Severe bullous emphysema - Status post endobronchial valve placement 9/4.  Chest tube management per CT surgery. -Add lidocaine patch to be placed on chest tube insertion site.  History of COPD with severe emphysema with active tobacco use - Not actively wheezing.  Continue bronchodilators  Chronic diastolic congestive heart failure with preserved ejection fraction, 60% -Continue home meds, no obvious signs of fluid overload echo in February 2020 showed ejection fraction 60%.  Severe protein calorie malnutrition -During previous admission he was seen by nutrition team recently  DVT prophylaxis: SCDs Code Status: DNR Family Communication: None at bedside Disposition Plan: Maintain hospital stay until chest tube is in place.  Urine after events was removed, he will likely need to be  observed for at least 24 hours.  Previously he has developed tension pneumothorax within 24 hours of removal of chest tube  Consultants:   CT surgery  Procedures:   Endobronchial valve placement 9/4  Antimicrobials:   None   Subjective: Seen and examined at bedside.  He appears slightly dyspneic as he had just gotten back from using the bathroom.  Otherwise no new complaints.  Review of Systems Otherwise negative except as per HPI, including: General = no fevers, chills, dizziness, malaise, fatigue HEENT/EYES = negative for pain, redness, loss of vision, double vision, blurred vision, loss of hearing, sore throat, hoarseness, dysphagia Cardiovascular= negative for chest pain, palpitation, murmurs, lower extremity swelling Respiratory/lungs= negative for cough, hemoptysis, wheezing, mucus production Gastrointestinal= negative for nausea, vomiting,, abdominal pain, melena, hematemesis Genitourinary= negative for Dysuria, Hematuria, Change in Urinary Frequency MSK = Negative for arthralgia, myalgias, Back Pain, Joint swelling  Neurology= Negative for headache, seizures, numbness, tingling  Psychiatry= Negative for anxiety, depression, suicidal and homocidal ideation Allergy/Immunology= Medication/Food allergy as listed  Skin= Negative for Rash, lesions, ulcers, itching    Objective: Vitals:   04/15/19 0545 04/15/19 0805 04/15/19 0850 04/15/19 0851  BP: (!) 152/93 119/86 119/86 119/86  Pulse: 83 86 86   Resp: 16 16    Temp: 97.9 F (36.6 C) 97.7 F (36.5 C)    TempSrc: Oral Oral    SpO2: 100% 94%    Weight: 51.6 kg     Height:        Intake/Output Summary (Last 24 hours) at 04/15/2019 0937 Last data filed at 04/15/2019 0900 Gross per 24 hour  Intake 300 ml  Output 425 ml  Net -125 ml   Filed Weights   04/13/19 0545 04/14/19 0601 04/15/19 0545  Weight: 52.1 kg 51.8 kg 51.6 kg    Examination: Constitutional: NAD, calm, comfortable Eyes: PERRL, lids and  conjunctivae normal ENMT: Mucous membranes are moist. Posterior pharynx clear of any exudate or lesions.Normal dentition.  Neck: normal, supple, no masses, no thyromegaly Respiratory: Diffuse diminished breath sounds Cardiovascular: Regular rate and rhythm, no murmurs / rubs / gallops. No extremity edema. 2+ pedal pulses. No carotid bruits.  Abdomen: no tenderness, no masses palpated. No hepatosplenomegaly. Bowel sounds positive.  Musculoskeletal: no clubbing / cyanosis. No joint deformity upper and lower extremities. Good ROM, no contractures. Normal muscle tone.  Skin: no rashes, lesions, ulcers. No induration Neurologic: CN 2-12 grossly intact. Sensation intact, DTR normal. Strength 5/5 in all 4.  Psychiatric: Normal judgment and insight. Alert and oriented x 3. Normal mood.   Chest tube currently in place  Data Reviewed:   CBC: Recent Labs  Lab 04/11/19 0237 04/12/19 0332 04/13/19 0419 04/14/19 0036 04/15/19 0400  WBC 8.1 9.7 7.6 6.9 6.7  HGB 12.6* 14.4 13.0 12.4* 12.6*  HCT 39.4 45.8 40.9 39.0 39.3  MCV 99.2 100.0 98.8 98.2 98.7  PLT 202 234 199 202 462   Basic Metabolic Panel: Recent Labs  Lab 04/11/19 0237 04/12/19 0332 04/13/19 0419 04/14/19 0036 04/15/19 0400  NA 134* 134* 134* 135 135  K 4.7 5.4* 4.6 4.4 4.3  CL 90* 88* 88* 88* 90*  CO2 37* 36* 39* 35* 40*  GLUCOSE 92 82 84 80 80  BUN 15 22 22 15 12   CREATININE 0.72 0.93 0.78 0.61 0.58*  CALCIUM 8.9 9.1 8.7* 8.8* 9.0  MG 1.6* 1.7 1.6* 1.6* 1.7   GFR: Estimated Creatinine Clearance: 66.3 mL/min (A) (by C-G formula based on SCr of 0.58 mg/dL (L)). Liver Function Tests: No results for input(s): AST, ALT, ALKPHOS, BILITOT, PROT, ALBUMIN in the last 168 hours. No results for input(s): LIPASE, AMYLASE in the last 168 hours. No results for input(s): AMMONIA in the last 168 hours. Coagulation Profile: No results for input(s): INR, PROTIME in the last 168 hours. Cardiac Enzymes: No results for input(s):  CKTOTAL, CKMB, CKMBINDEX, TROPONINI in the last 168 hours. BNP (last 3 results) No results for input(s): PROBNP in the last 8760 hours. HbA1C: No results for input(s): HGBA1C in the last 72 hours. CBG: No results for input(s): GLUCAP in the last 168 hours. Lipid Profile: No results for input(s): CHOL, HDL, LDLCALC, TRIG, CHOLHDL, LDLDIRECT in the last 72 hours. Thyroid Function Tests: No results for input(s): TSH, T4TOTAL, FREET4, T3FREE, THYROIDAB in the last 72 hours. Anemia Panel: No results for input(s): VITAMINB12, FOLATE, FERRITIN, TIBC, IRON, RETICCTPCT in the last 72 hours. Sepsis Labs: No results for input(s): PROCALCITON, LATICACIDVEN in the last 168 hours.  Recent Results (from the past 240 hour(s))  SARS Coronavirus 2 [facility] order, Performed in [facility] hospital lab) Nasopharyngeal Nasopharyngeal Swab     Status: None   Collection Time: 04/09/19  8:42 AM   Specimen: Nasopharyngeal Swab  Result Value Ref Range Status   SARS Coronavirus 2 NEGATIVE NEGATIVE Final    Comment: (NOTE) If result is NEGATIVE SARS-CoV-2 target nucleic acids are NOT DETECTED. The SARS-CoV-2 RNA is generally detectable in upper and lower  respiratory specimens during the acute phase of infection. The lowest  concentration of SARS-CoV-2 viral copies this assay can detect is 250  copies / mL. A negative result does not preclude SARS-CoV-2 infection  and should not be used as the sole basis for treatment or other  patient  management decisions.  A negative result may occur with  improper specimen collection / handling, submission of specimen other  than nasopharyngeal swab, presence of viral mutation(s) within the  areas targeted by this assay, and inadequate number of viral copies  (<250 copies / mL). A negative result must be combined with clinical  observations, patient history, and epidemiological information. If result is POSITIVE SARS-CoV-2 target nucleic acids are DETECTED. The  SARS-CoV-2 RNA is generally detectable in upper and lower  respiratory specimens dur ing the acute phase of infection.  Positive  results are indicative of active infection with SARS-CoV-2.  Clinical  correlation with patient history and other diagnostic information is  necessary to determine patient infection status.  Positive results do  not rule out bacterial infection or co-infection with other viruses. If result is PRESUMPTIVE POSTIVE SARS-CoV-2 nucleic acids MAY BE PRESENT.   A presumptive positive result was obtained on the submitted specimen  and confirmed on repeat testing.  While 2019 novel coronavirus  (SARS-CoV-2) nucleic acids may be present in the submitted sample  additional confirmatory testing may be necessary for epidemiological  and / or clinical management purposes  to differentiate between  SARS-CoV-2 and other Sarbecovirus currently known to infect humans.  If clinically indicated additional testing with an alternate test  methodology [phone]) is advised. The SARS-CoV-2 RNA is generally  detectable in upper and lower respiratory sp ecimens during the acute  phase of infection. The expected result is Negative. Fact Sheet for Patients:  BoilerBrush.com.cy Fact Sheet for Healthcare Providers: https://pope.com/ This test is not yet approved or cleared by the [place] FDA and has been authorized for detection and/or diagnosis of SARS-CoV-2 by FDA under an Emergency Use Authorization (EUA).  This EUA will remain in effect (meaning this test can be used) for the duration of the COVID-19 declaration under Section 564(b)(1) of the Act, 21 U.S.C. section 360bbb-3(b)(1), unless the authorization is terminated or revoked sooner. Performed at [facility], [address] [place], [place] [address]   Surgical pcr screen     Status: None   Collection Time: 04/10/19 10:10 PM   Specimen: Nasal Mucosa; Nasal Swab  Result  Value Ref Range Status   MRSA, PCR NEGATIVE NEGATIVE Final   Staphylococcus aureus NEGATIVE NEGATIVE Final    Comment: (NOTE) The Xpert SA Assay (FDA approved for NASAL specimens in patients 69 years of age and older), is one component of a comprehensive surveillance program. It is not intended to diagnose infection nor to guide or monitor treatment. Performed at [facility], [address] [place], [place] [address]          Radiology Studies: Dg Chest Port 1 View  Result Date: 04/15/2019 CLINICAL DATA:  Right-sided pneumothorax. EXAM: PORTABLE CHEST 1 VIEW COMPARISON:  Radiograph of April 14, 2019. FINDINGS: Stable cardiomediastinal silhouette. Atherosclerosis of thoracic aorta is noted. Right-sided chest tube is unchanged in position. Severe emphysematous disease is noted throughout both lungs. No definite pneumothorax is noted. Stable right basilar opacity is noted concerning for atelectasis or possibly infiltrate. Small right pleural effusion may be present. Bony thorax unremarkable. IMPRESSION: Stable position of right-sided chest tube without definite pneumothorax. Severe emphysematous disease is noted bilaterally. Stable right basilar opacity is noted with small right pleural effusion. Aortic Atherosclerosis (ICD10-I70.0) and Emphysema (ICD10-J43.9). Electronically Signed   By: Lupita Raider M.D.   On: 04/15/2019 07:42   Dg Chest Port 1 View  Result Date: 04/14/2019 CLINICAL DATA:  History of  right pneumothorax.  Chest tube in place. EXAM: PORTABLE CHEST 1 VIEW COMPARISON:  Single-view of the chest 04/13/2019 and 04/10/2019. FINDINGS: Right chest tube remains in place, unchanged. No pneumothorax. Lungs are emphysematous. Right basilar airspace opacity is mildly improved compared to yesterday's examination. Heart size is normal. Atherosclerosis. IMPRESSION: Negative for right pneumothorax with a chest tube in place. Right basilar airspace disease has mildly improved since  yesterday's examination. Emphysema. Atherosclerosis. Electronically Signed   By: Drusilla Kanner M.D.   On: 04/14/2019 10:50        Scheduled Meds: . acetaminophen  650 mg Oral Q6H   Or  . acetaminophen  650 mg Rectal Q6H  . carvedilol  3.125 mg Oral BID  . docusate sodium  100 mg Oral BID  . feeding supplement (ENSURE ENLIVE)  237 mL Oral TID BM  . fluticasone  2 spray Each Nare Daily  . furosemide  40 mg Oral Daily  . lisinopril  2.5 mg Oral Daily  . mouth rinse  15 mL Mouth Rinse BID  . multivitamin with minerals  1 tablet Oral Daily  . potassium chloride SA  20 mEq Oral Daily  . sodium chloride flush  3 mL Intravenous Q12H   Continuous Infusions: . sodium chloride       LOS: 6 days   Time spent= 25 mins    Derrick Gathright Joline Maxcy, MD [facility]  If 7PM-7AM, please contact night-coverage [email or web address] 04/15/2019, 9:37 AM

## 2019-04-15 NOTE — Progress Notes (Addendum)
      [address]       [place] [address]             [phone]       4 Days Post-Op Procedure(s) (LRB): VIDEO BRONCHOSCOPY WITH INSERTION OF INTERBRONCHIAL VALVE (IBV) (N/A)  Subjective: Patient eating breakfast this am. He states his breathing is "alright".  Objective: Vital signs in last 24 hours: Temp:  [97.9 F (36.6 C)-98.7 F (37.1 C)] 97.9 F (36.6 C) (09/08 0545) Pulse Rate:  [77-87] 83 (09/08 0545) Cardiac Rhythm: Normal sinus rhythm (09/07 1901) Resp:  [15-20] 16 (09/08 0545) BP: (102-152)/(78-93) 152/93 (09/08 0545) SpO2:  [93 %-100 %] 100 % (09/08 0545) Weight:  [51.6 kg] 51.6 kg (09/08 0545)     Intake/Output from previous day: 09/07 0701 - 09/08 0700 In: 210 [P.O.:210] Out: 825 [Urine:825]   Physical Exam:  Cardiovascular: RRR Pulmonary: Diminished breath sounds bilaterally Abdomen: Soft, non tender, bowel sounds present. Extremities: No lower extremity edema. Wounds: Dressing is clean and dry.   Chest Tube: to suction, no air leak  Lab Results: CBC: Recent Labs    04/14/19 0036 04/15/19 0400  WBC 6.9 6.7  HGB 12.4* 12.6*  HCT 39.0 39.3  PLT 202 199   BMET:  Recent Labs    04/14/19 0036 04/15/19 0400  NA 135 135  K 4.4 4.3  CL 88* 90*  CO2 35* 40*  GLUCOSE 80 80  BUN 15 12  CREATININE 0.61 0.58*  CALCIUM 8.8* 9.0    PT/INR: No results for input(s): LABPROT, INR in the last 72 hours. ABG:  INR: Will add last result for INR, ABG once components are confirmed Will add last 4 CBG results once components are confirmed  Assessment/Plan:  1. CV - SR this am. On 6 liters of oxygen via [place]. 2.  Pulmonary - History of severe COPD. S/p endobronchial valve RUL, chemical pleurodesis 09/04. Pigtail chest tube with scant output last 24 hours. Pigtail chest tube is to suction (decreased to 10 cm yesterday). CXR this am appears stable. Will place chest tube to water seal. Check CXR in am. 3. Anemia-H and H stable at  12.6 and 39.3  Donielle M ZimmermanPA-C 04/15/2019,7:15 AM [phone]  Agree with above CT to [place] today Likely out tomorrow Lajuana Matte

## 2019-04-16 ENCOUNTER — Inpatient Hospital Stay (HOSPITAL_COMMUNITY): Payer: Medicare HMO

## 2019-04-16 MED ORDER — INFLUENZA VAC A&B SA ADJ QUAD 0.5 ML IM PRSY
0.5000 mL | PREFILLED_SYRINGE | INTRAMUSCULAR | Status: DC
  Filled 2019-04-16: qty 0.5

## 2019-04-16 NOTE — Progress Notes (Signed)
PROGRESS NOTE    Derrick [name]  WUJ:[id number] DOB: [birth year] DOA: 04/09/2019 PCP: Gareth MorganKnowlton, Steve, MD   Brief Narrative: 68 year old with past medical history significant for COPD, pulmonary hypertension, heavy tobacco user, diastolic heart failure and rheumatoid arthritis with recurrent pneumothorax presents to [facility] initially with shortness of breath.  Patient was found to have a moderate sized right-sided pneumothorax.  [facility] care and management by CVTS.  Patient underwent endobronchial valve placement on 9/4.   Assessment & Plan:   Principal Problem:   Pneumothorax on right Active Problems:   Pulmonary HTN (HCC)   Acute respiratory failure with hypoxia (HCC)   Spontaneous pneumothorax   Protein-calorie malnutrition, severe   Chest tube in place   Status post chest tube placement (HCC)   Weakness generalized   Nicotine dependence, chewing tobacco, uncomplicated   RA (rheumatoid arthritis) (HCC)   CHF (congestive heart failure) (HCC)   COPD (chronic obstructive pulmonary disease) (HCC)   1-Acute hypoxic respiratory failure: Related to recurrent right side moderate pneumothorax. -In the setting of severe bullous emphysema. -Patient is status post endobronchial valve placement 9/4. -Chest tube management per CT surgery. -On lidocaine patch to help with pain at the chest tube site. -Chest x-ray stable today, CVTS planning to remove chest tube.  2-History of COPD with severe emphysema with active tobacco use -Stable. -Continue with nebulizers.  3-Chronic diastolic congestive heart failure with preserved ejection fraction, 60%: Continue with Lasix, lisinopril, carvedilol. Severe protein calorie malnutrition: Continue with Ensure.     Nutrition Problem: Severe Malnutrition Etiology: chronic illness(COPD/recurrent R pneumo)    Signs/Symptoms: severe fat depletion, severe muscle depletion    Interventions: Ensure Enlive  (each supplement provides 350kcal and 20 grams of protein), MVI  Estimated body mass index is 15.75 kg/m as calculated from the following:   Height as of this encounter: 5\' 11"  (1.803 m).   Weight as of this encounter: 51.2 kg.   DVT prophylaxis: SCDs Code Status: DNR Family Communication: Care discussed with patient Disposition Plan: Remain in the hospital for treatment of pneumothorax, high risk for decompensation Consultants:   CVTS  Procedures:   Bronchial valve placement on 9/4  Antimicrobials:  None  Subjective: He report feeling a stable denies worsening shortness of breath,.   Objective: Vitals:   04/15/19 1633 04/15/19 2024 04/16/19 0356 04/16/19 0420  BP: 118/84 115/81 118/82   Pulse: 86 82 88   Resp: 14 15 17    Temp: 98.2 F (36.8 C) 97.9 F (36.6 C) 98 F (36.7 C)   TempSrc: Oral Oral Oral   SpO2: 93% 98% 93%   Weight:    51.2 kg  Height:        Intake/Output Summary (Last 24 hours) at 04/16/2019 0730 Last data filed at 04/16/2019 0420 Gross per 24 hour  Intake 640 ml  Output 1000 ml  Net -360 ml   Filed Weights   04/14/19 0601 04/15/19 0545 04/16/19 0420  Weight: 51.8 kg 51.6 kg 51.2 kg    Examination:  General exam: Appears calm and comfortable  Respiratory system: Decreased breath sounds, chest tube on the right side. Respiratory effort normal. Cardiovascular system: S1 & S2 heard, RRR. No JVD, murmurs, rubs, gallops or clicks. No pedal edema. Gastrointestinal system: Abdomen is nondistended, soft and nontender. No organomegaly or masses felt. Normal bowel sounds heard. Central nervous system: Alert and oriented. No focal neurological deficits. Extremities: Symmetric 5 x 5 power. Skin: No rashes, lesions or ulcers  Data Reviewed: I have personally reviewed following labs and imaging studies  CBC: Recent Labs  Lab 04/11/19 0237 04/12/19 0332 04/13/19 0419 04/14/19 0036 04/15/19 0400  WBC 8.1 9.7 7.6 6.9 6.7  HGB 12.6* 14.4 13.0  12.4* 12.6*  HCT 39.4 45.8 40.9 39.0 39.3  MCV 99.2 100.0 98.8 98.2 98.7  PLT 202 234 199 202 245   Basic Metabolic Panel: Recent Labs  Lab 04/11/19 0237 04/12/19 0332 04/13/19 0419 04/14/19 0036 04/15/19 0400  NA 134* 134* 134* 135 135  K 4.7 5.4* 4.6 4.4 4.3  CL 90* 88* 88* 88* 90*  CO2 37* 36* 39* 35* 40*  GLUCOSE 92 82 84 80 80  BUN 15 22 22 15 12   CREATININE 0.72 0.93 0.78 0.61 0.58*  CALCIUM 8.9 9.1 8.7* 8.8* 9.0  MG 1.6* 1.7 1.6* 1.6* 1.7   GFR: Estimated Creatinine Clearance: 65.8 mL/min (A) (by C-G formula based on SCr of 0.58 mg/dL (L)). Liver Function Tests: No results for input(s): AST, ALT, ALKPHOS, BILITOT, PROT, ALBUMIN in the last 168 hours. No results for input(s): LIPASE, AMYLASE in the last 168 hours. No results for input(s): AMMONIA in the last 168 hours. Coagulation Profile: No results for input(s): INR, PROTIME in the last 168 hours. Cardiac Enzymes: No results for input(s): CKTOTAL, CKMB, CKMBINDEX, TROPONINI in the last 168 hours. BNP (last 3 results) No results for input(s): PROBNP in the last 8760 hours. HbA1C: No results for input(s): HGBA1C in the last 72 hours. CBG: No results for input(s): GLUCAP in the last 168 hours. Lipid Profile: No results for input(s): CHOL, HDL, LDLCALC, TRIG, CHOLHDL, LDLDIRECT in the last 72 hours. Thyroid Function Tests: No results for input(s): TSH, T4TOTAL, FREET4, T3FREE, THYROIDAB in the last 72 hours. Anemia Panel: No results for input(s): VITAMINB12, FOLATE, FERRITIN, TIBC, IRON, RETICCTPCT in the last 72 hours. Sepsis Labs: No results for input(s): PROCALCITON, LATICACIDVEN in the last 168 hours.  Recent Results (from the past 240 hour(s))  SARS Coronavirus 2 [facility] order, Performed in [facility] hospital lab) Nasopharyngeal Nasopharyngeal Swab     Status: None   Collection Time: 04/09/19  8:42 AM   Specimen: Nasopharyngeal Swab  Result Value Ref Range Status   SARS Coronavirus 2 NEGATIVE NEGATIVE  Final    Comment: (NOTE) If result is NEGATIVE SARS-CoV-2 target nucleic acids are NOT DETECTED. The SARS-CoV-2 RNA is generally detectable in upper and lower  respiratory specimens during the acute phase of infection. The lowest  concentration of SARS-CoV-2 viral copies this assay can detect is 250  copies / mL. A negative result does not preclude SARS-CoV-2 infection  and should not be used as the sole basis for treatment or other  patient management decisions.  A negative result may occur with  improper specimen collection / handling, submission of specimen other  than nasopharyngeal swab, presence of viral mutation(s) within the  areas targeted by this assay, and inadequate number of viral copies  (<250 copies / mL). A negative result must be combined with clinical  observations, patient history, and epidemiological information. If result is POSITIVE SARS-CoV-2 target nucleic acids are DETECTED. The SARS-CoV-2 RNA is generally detectable in upper and lower  respiratory specimens dur ing the acute phase of infection.  Positive  results are indicative of active infection with SARS-CoV-2.  Clinical  correlation with patient history and other diagnostic information is  necessary to determine patient infection status.  Positive results do  not rule out bacterial infection or co-infection with other  viruses. If result is PRESUMPTIVE POSTIVE SARS-CoV-2 nucleic acids MAY BE PRESENT.   A presumptive positive result was obtained on the submitted specimen  and confirmed on repeat testing.  While 2019 novel coronavirus  (SARS-CoV-2) nucleic acids may be present in the submitted sample  additional confirmatory testing may be necessary for epidemiological  and / or clinical management purposes  to differentiate between  SARS-CoV-2 and other Sarbecovirus currently known to infect humans.  If clinically indicated additional testing with an alternate test  methodology [phone]) is advised. The  SARS-CoV-2 RNA is generally  detectable in upper and lower respiratory sp ecimens during the acute  phase of infection. The expected result is Negative. Fact Sheet for Patients:  BoilerBrush.com.cy Fact Sheet for Healthcare Providers: https://pope.com/ This test is not yet approved or cleared by the [place] FDA and has been authorized for detection and/or diagnosis of SARS-CoV-2 by FDA under an Emergency Use Authorization (EUA).  This EUA will remain in effect (meaning this test can be used) for the duration of the COVID-19 declaration under Section 564(b)(1) of the Act, 21 U.S.C. section 360bbb-3(b)(1), unless the authorization is terminated or revoked sooner. Performed at [facility], [address], [place], [place] [address]   Surgical pcr screen     Status: None   Collection Time: 04/10/19 10:10 PM   Specimen: Nasal Mucosa; Nasal Swab  Result Value Ref Range Status   MRSA, PCR NEGATIVE NEGATIVE Final   Staphylococcus aureus NEGATIVE NEGATIVE Final    Comment: (NOTE) The Xpert SA Assay (FDA approved for NASAL specimens in patients 41 years of age and older), is one component of a comprehensive surveillance program. It is not intended to diagnose infection nor to guide or monitor treatment. Performed at [facility], [address], [place], [place] [address]          Radiology Studies: Dg Chest Port 1 View  Result Date: 04/15/2019 CLINICAL DATA:  Right-sided pneumothorax. EXAM: PORTABLE CHEST 1 VIEW COMPARISON:  Radiograph of April 14, 2019. FINDINGS: Stable cardiomediastinal silhouette. Atherosclerosis of thoracic aorta is noted. Right-sided chest tube is unchanged in position. Severe emphysematous disease is noted throughout both lungs. No definite pneumothorax is noted. Stable right basilar opacity is noted concerning for atelectasis or possibly infiltrate. Small right pleural effusion may be present. Bony  thorax unremarkable. IMPRESSION: Stable position of right-sided chest tube without definite pneumothorax. Severe emphysematous disease is noted bilaterally. Stable right basilar opacity is noted with small right pleural effusion. Aortic Atherosclerosis (ICD10-I70.0) and Emphysema (ICD10-J43.9). Electronically Signed   By: Lupita Raider M.D.   On: 04/15/2019 07:42   Dg Chest Port 1 View  Result Date: 04/14/2019 CLINICAL DATA:  History of right pneumothorax.  Chest tube in place. EXAM: PORTABLE CHEST 1 VIEW COMPARISON:  Single-view of the chest 04/13/2019 and 04/10/2019. FINDINGS: Right chest tube remains in place, unchanged. No pneumothorax. Lungs are emphysematous. Right basilar airspace opacity is mildly improved compared to yesterday's examination. Heart size is normal. Atherosclerosis. IMPRESSION: Negative for right pneumothorax with a chest tube in place. Right basilar airspace disease has mildly improved since yesterday's examination. Emphysema. Atherosclerosis. Electronically Signed   By: Drusilla Kanner M.D.   On: 04/14/2019 10:50        Scheduled Meds:  acetaminophen  650 mg Oral Q6H   Or   acetaminophen  650 mg Rectal Q6H   carvedilol  3.125 mg Oral BID   docusate sodium  100 mg Oral BID   feeding supplement (  ENSURE ENLIVE)  237 mL Oral TID BM   fluticasone  2 spray Each Nare Daily   furosemide  40 mg Oral Daily   lidocaine  1 patch Transdermal Q24H   lisinopril  2.5 mg Oral Daily   mouth rinse  15 mL Mouth Rinse BID   multivitamin with minerals  1 tablet Oral Daily   potassium chloride SA  20 mEq Oral Daily   sodium chloride flush  3 mL Intravenous Q12H   Continuous Infusions:  sodium chloride       LOS: 7 days    Time spent: 35 minutes    Alba Cory, MD [facility] Pager [phone]  If 7PM-7AM, please contact night-coverage [email or web address] Password [email or web address] 04/16/2019, 7:30 AM

## 2019-04-16 NOTE — Progress Notes (Signed)
Nutrition Follow-up  DOCUMENTATION CODES:   Severe malnutrition in context of chronic illness, Underweight  INTERVENTION:  -Continue Ensure Enlive po BID, each supplement provides 350 kcal and 20 grams of protein  -Continue Magic cup TID with meals, each supplement provides 290 kcal and 9 grams of protein  -Continue MVI daily   NUTRITION DIAGNOSIS:   Severe Malnutrition related to chronic illness(COPD/recurrent R pneumo) as evidenced by severe fat depletion, severe muscle depletion.  ongoing  GOAL:   Patient will meet greater than or equal to 90% of their needs  progressing  MONITOR:   PO intake, Supplement acceptance, Weight trends, Labs, I & O's, Skin  REASON FOR ASSESSMENT:   Other (Comment)(low BMI)    ASSESSMENT:  RD working remotely.  Patient with PMH significant for severe emphysema/COPD and recurrent spontaneous R sided pneumothorax. Presents this admission with R pneumothorax.  9/2- R chest tube 9/4 - endobronchial valve placment 9/9 - R chest tube removed  Patient eating 25-75% of meals at this time Drinking Ensure BID (110-240 ml per RN documentation)   Current wt 51.2 kg      Admit wt 52.6 kg  I/Os: -2865 ml since admit UOP: 1000 ml x 24 hrs  Medications reviewed and include: colace,  Lasix, MVI, potassium chloride 20 mEq  Labs: current labs unavailable at this time  Diet Order:   Diet Order            Diet regular Room service appropriate? Yes with Assist; Fluid consistency: Thin  Diet effective now              EDUCATION NEEDS:   Education needs have been addressed  Skin:  Skin Assessment: Reviewed RN Assessment  Last BM:  9/2  Height:   Ht Readings from Last 1 Encounters:  04/09/19 5\' 11"  (1.803 m)    Weight:   Wt Readings from Last 1 Encounters:  04/16/19 51.2 kg    Ideal Body Weight:  78.2 kg  BMI:  Body mass index is 15.75 kg/m.  Estimated Nutritional Needs:   Kcal:  2050-2250 kcal  Protein:  105-120  grams  Fluid:  >/= 2 L/day  Lajuan Lines, RD, LDN [employer] Telephone [phone] After Hours/Weekend Pager: [phone]

## 2019-04-16 NOTE — Discharge Instructions (Signed)
Pneumothorax °A pneumothorax is commonly called a collapsed lung. It is a condition in which air leaks from a lung and builds up between the thin layer of tissue that covers the lungs (visceral pleura) and the interior wall of the chest cavity (parietal pleura). The air gets trapped outside the lung, between the lung and the chest wall (pleural space). The air takes up space and prevents the lung from fully expanding. °This condition sometimes occurs suddenly with no apparent cause. The buildup of air may be small or large. A small pneumothorax may go away on its own. A large pneumothorax will require treatment and hospitalization. °What are the causes? °This condition may be caused by: °· Trauma and injury to the chest wall. °· Surgery and other medical procedures. °· A complication of an underlying lung problem, especially chronic obstructive pulmonary disease (COPD) or emphysema. °Sometimes the cause of this condition is not known. °What increases the risk? °You are more likely to develop this condition if: °· You have an underlying lung problem. °· You smoke. °· You are 20-40 years old, male, tall, and underweight. °· You have a personal or family history of pneumothorax. °· You have an eating disorder (anorexia nervosa). °This condition can also happen quickly, even in people with no history of lung problems. °What are the signs or symptoms? °Sometimes a pneumothorax will have no symptoms. When symptoms are present, they can include: °· Chest pain. °· Shortness of breath. °· Increased rate of breathing. °· Bluish color to your lips or skin (cyanosis). °How is this diagnosed? °This condition may be diagnosed by: °· A medical history and physical exam. °· A chest X-ray, chest CT scan, or ultrasound. °How is this treated? °Treatment depends on how severe your condition is. The goal of treatment is to remove the extra air and allow your lung to expand back to its normal size. °· For a small pneumothorax: °? No  treatment may be needed. °? Extra oxygen is sometimes used to make it go away more quickly. °· For a large pneumothorax or a pneumothorax that is causing symptoms, a procedure is done to drain the air from your lungs. To do this, a health care provider may use: °? A needle with a syringe. This is used to suck air from a pleural space where no additional leakage is taking place. °? A chest tube. This is used to suck air where there is ongoing leakage into the pleural space. The chest tube may need to remain in place for several days until the air leak has healed. °· In more severe cases, surgery may be needed to repair the damage that is causing the leak. °· If you have multiple pneumothorax episodes or have an air leak that will not heal, a procedure called a pleurodesis may be done. A medicine is placed in the pleural space to irritate the tissues around the lung so that the lung will stick to the chest wall, seal any leaks, and stop any buildup of air in that space. °If you have an underlying lung problem, severe symptoms, or a large pneumothorax you will usually need to stay in the hospital. °Follow these instructions at home: °Lifestyle °· Do not use any products that contain nicotine or tobacco, such as cigarettes and e-cigarettes. These are major risk factors in pneumothorax. If you need help quitting, ask your health care provider. °· Do not lift anything that is heavier than 10 lb (4.5 kg), or the limit that your health care   provider tells you, until he or she says that it is safe. °· Avoid activities that take a lot of effort (strenuous) for as long as told by your health care provider. °· Return to your normal activities as told by your health care provider. Ask your health care provider what activities are safe for you. °· Do not fly in an airplane or scuba dive until your health care provider says it is okay. °General instructions °· Take over-the-counter and prescription medicines only as told by your  health care provider. °· If a cough or pain makes it difficult for you to sleep at night, try sleeping in a semi-upright position in a recliner or by using 2 or 3 pillows. °· If you had a chest tube and it was removed, ask your health care provider when you can remove the bandage (dressing). While the dressing is in place, do not allow it to get wet. °· Keep all follow-up visits as told by your health care provider. This is important. °Contact a health care provider if: °· You cough up thick mucus (sputum) that is yellow or green in color. °· You were treated with a chest tube, and you have redness, increasing pain, or discharge at the site where it was placed. °Get help right away if: °· You have increasing chest pain or shortness of breath. °· You have a cough that will not go away. °· You begin coughing up blood. °· You have pain that is getting worse or is not controlled with medicines. °· The site where your chest tube was located opens up. °· You feel air coming out of the site where the chest tube was placed. °· You have a fever or persistent symptoms for more than 2-3 days. °· You have a fever and your symptoms suddenly get worse. °These symptoms may represent a serious problem that is an emergency. Do not wait to see if the symptoms will go away. Get medical help right away. Call your local emergency services (911 in the [place] Do not drive yourself to the hospital. °Summary °· A pneumothorax, commonly called a collapsed lung, is a condition in which air leaks from a lung and gets trapped between the lung and the chest wall (pleural space). °· The buildup of air may be small or large. A small pneumothorax may go away on its own. A large pneumothorax will require treatment and hospitalization. °· Treatment for this condition depends on how severe the pneumothorax is. The goal of treatment is to remove the extra air and allow the lung to expand back to its normal size. °This information is not intended to  replace advice given to you by your health care provider. Make sure you discuss any questions you have with your health care provider. °Document Released: 07/24/2005 Document Revised: 07/06/2017 Document Reviewed: 07/02/2017 °Elsevier Patient Education © 2020 [employer] Inc. ° °

## 2019-04-16 NOTE — Progress Notes (Addendum)
      [address]       [place] [address]             [phone]       5 Days Post-Op Procedure(s) (LRB): VIDEO BRONCHOSCOPY WITH INSERTION OF INTERBRONCHIAL VALVE (IBV) (N/A)  Subjective: Patient states he did notice any change in his breathing over the last 24 hours.  Objective: Vital signs in last 24 hours: Temp:  [97.7 F (36.5 C)-98.2 F (36.8 C)] 98 F (36.7 C) (09/09 0356) Pulse Rate:  [82-88] 88 (09/09 0356) Cardiac Rhythm: Normal sinus rhythm (09/09 0340) Resp:  [14-17] 17 (09/09 0356) BP: (115-119)/(81-86) 118/82 (09/09 0356) SpO2:  [93 %-98 %] 93 % (09/09 0356) Weight:  [51.2 kg] 51.2 kg (09/09 0420)     Intake/Output from previous day: 09/08 0701 - 09/09 0700 In: 640 [P.O.:640] Out: 1000 [Urine:1000]   Physical Exam:  Cardiovascular: RRR Pulmonary: Diminished breath sounds bilaterally Abdomen: Soft, non tender, bowel sounds present. Extremities: No lower extremity edema. Wounds: Dressing is clean and dry.   Chest Tube: to water seal, no air leak  Lab Results: CBC: Recent Labs    04/14/19 0036 04/15/19 0400  WBC 6.9 6.7  HGB 12.4* 12.6*  HCT 39.0 39.3  PLT 202 199   BMET:  Recent Labs    04/14/19 0036 04/15/19 0400  NA 135 135  K 4.4 4.3  CL 88* 90*  CO2 35* 40*  GLUCOSE 80 80  BUN 15 12  CREATININE 0.61 0.58*  CALCIUM 8.8* 9.0    PT/INR: No results for input(s): LABPROT, INR in the last 72 hours. ABG:  INR: Will add last result for INR, ABG once components are confirmed Will add last 4 CBG results once components are confirmed  Assessment/Plan:  1. CV - SR this am. On 6 liters of oxygen via [place]. 2.  Pulmonary - History of severe COPD. S/p endobronchial valve RUL, chemical pleurodesis 09/04. Pigtail chest tube with scant output last 24 hours. Pigtail chest tube is to water seal. CXR this am appears stable.  Hope to remove chest tube. Check CXR in am. 3. Anemia-H and H stable at 12.6 and 39.3  Donielle M  ZimmermanPA-C 04/16/2019,7:12 AM [phone]  Agree with above Will remove chest tube today Jimmylee Ratterree O Dagmawi Venable

## 2019-04-16 NOTE — Progress Notes (Signed)
Pt chest tube removed.  Pt denies complaints. Tolerated well. 0 drainage prior to removal. vitals monitored q 19min. Will monitor pt. Jerald Kief, RN

## 2019-04-17 ENCOUNTER — Inpatient Hospital Stay (HOSPITAL_COMMUNITY): Payer: Medicare HMO

## 2019-04-17 NOTE — Progress Notes (Addendum)
      [address]       [place] [address]             [phone]       6 Days Post-Op Procedure(s) (LRB): VIDEO BRONCHOSCOPY WITH INSERTION OF INTERBRONCHIAL VALVE (IBV) (N/A)  Subjective: Patient denies any specific complaints this am.  Objective: Vital signs in last 24 hours: Temp:  [97.6 F (36.4 C)-98.3 F (36.8 C)] 98.1 F (36.7 C) (09/10 0425) Pulse Rate:  [73-101] 90 (09/10 0430) Cardiac Rhythm: Sinus tachycardia;Bundle branch block (09/09 1901) Resp:  [16-22] 18 (09/10 0425) BP: (112-139)/(78-104) 118/79 (09/10 0425) SpO2:  [91 %-96 %] 96 % (09/10 0430) Weight:  [50.9 kg] 50.9 kg (09/10 0430)     Intake/Output from previous day: 09/09 0701 - 09/10 0700 In: 300 [P.O.:300] Out: 900 [Urine:900]   Physical Exam:  Cardiovascular: RRR Pulmonary: Diminished breath sounds bilaterally Abdomen: Soft, non tender, bowel sounds present. Extremities: No lower extremity edema. Wounds: Dressing is clean and dry.     Lab Results: CBC: Recent Labs    04/15/19 0400  WBC 6.7  HGB 12.6*  HCT 39.3  PLT 199   BMET:  Recent Labs    04/15/19 0400  NA 135  K 4.3  CL 90*  CO2 40*  GLUCOSE 80  BUN 12  CREATININE 0.58*  CALCIUM 9.0    PT/INR: No results for input(s): LABPROT, INR in the last 72 hours. ABG:  INR: Will add last result for INR, ABG once components are confirmed Will add last 4 CBG results once components are confirmed  Assessment/Plan:  1. CV - SR this am.  2.  Pulmonary - History of severe COPD. S/p endobronchial valve RUL, chemical pleurodesis 09/04. He has been on 6 liters of oxygen via [place]. Pigtail chest tube removed yesterday. CXR ordered but not taken yet 3. Anemia-H and H stable at 12.6 and 39.3 4. If CXR stable, will see PRN. Follow up appointment arranged.  Donielle M ZimmermanPA-C 04/17/2019,7:07 AM [phone]   Agree with above No significant change following CT removal Will continue to follow peripherally  Will follow-up with me next week.  Alyzza Andringa Bary Leriche

## 2019-04-17 NOTE — Plan of Care (Signed)
  Problem: Education: Goal: Knowledge of General Education information will improve Description Including pain rating scale, medication(s)/side effects and non-pharmacologic comfort measures Outcome: Progressing   Problem: Health Behavior/Discharge Planning: Goal: Ability to manage health-related needs will improve Outcome: Progressing   

## 2019-04-17 NOTE — Progress Notes (Signed)
PROGRESS NOTE    Derrick [name]  XYB:[id number] DOB: 15-Sep-1950 DOA: 04/09/2019 PCP: Gareth Morgan, MD   Brief Narrative: 68 year old with past medical history significant for COPD, pulmonary hypertension, heavy tobacco user, diastolic heart failure and rheumatoid arthritis with recurrent pneumothorax presents to [facility] initially with shortness of breath.  Patient was found to have a moderate sized right-sided pneumothorax.  Transferred to [facility] for further care and management by CVTS.  Patient underwent endobronchial valve placement on 9/4.   Assessment & Plan:   Principal Problem:   Pneumothorax on right Active Problems:   Pulmonary HTN (HCC)   Acute respiratory failure with hypoxia (HCC)   Spontaneous pneumothorax   Protein-calorie malnutrition, severe   Chest tube in place   Status post chest tube placement (HCC)   Weakness generalized   Nicotine dependence, chewing tobacco, uncomplicated   RA (rheumatoid arthritis) (HCC)   CHF (congestive heart failure) (HCC)   COPD (chronic obstructive pulmonary disease) (HCC)   1-Acute hypoxic respiratory failure: Related to recurrent right side moderate pneumothorax. -In the setting of severe bullous emphysema. -Patient is status post endobronchial valve placement 9/4. -Chest tube management per CT surgery. -On lidocaine patch to help with pain at the chest tube site. -Chest tube remove 9/09. Follow up chest x ray today stable.  Monitor for 48 hours, high risk for pneumothorax recurrence.   2-History of COPD with severe emphysema with active tobacco use -Stable. -Continue with nebulizers. -will refer to pulmonologist.  -poor prognosis, he was evaluated by palliative care on 8-19.  3-Chronic diastolic congestive heart failure with preserved ejection fraction, 60%: Continue with Lasix, lisinopril, carvedilol. Severe protein calorie malnutrition: Continue with Ensure.  Severe malnutrition;  Continue with ensure.         Nutrition Problem: Severe Malnutrition Etiology: chronic illness(COPD/recurrent R pneumo)    Signs/Symptoms: severe fat depletion, severe muscle depletion    Interventions: Ensure Enlive (each supplement provides 350kcal and 20 grams of protein), MVI  Estimated body mass index is 15.65 kg/m as calculated from the following:   Height as of this encounter: 5\' 11"  (1.803 m).   Weight as of this encounter: 50.9 kg.   DVT prophylaxis: SCDs Code Status: DNR Family Communication: Care discussed with patient Disposition Plan: Remain in the hospital for treatment of pneumothorax, high risk for decompensation Consultants:   CVTS  Procedures:   Bronchial valve placement on 9/4  Antimicrobials:  None  Subjective: He report dyspnea on and off.   Objective: Vitals:   04/17/19 0827 04/17/19 0841 04/17/19 0843 04/17/19 1356  BP: 114/89 114/89 114/89 110/86  Pulse: 92  92 94  Resp: 19   17  Temp: 98 F (36.7 C)   98.1 F (36.7 C)  TempSrc: Oral   Oral  SpO2: 91%   97%  Weight:      Height:        Intake/Output Summary (Last 24 hours) at 04/17/2019 1515 Last data filed at 04/17/2019 1300 Gross per 24 hour  Intake 240 ml  Output 1050 ml  Net -810 ml   Filed Weights   04/15/19 0545 04/16/19 0420 04/17/19 0430  Weight: 51.6 kg 51.2 kg 50.9 kg    Examination:  General exam: NAD Respiratory system: mild tachypnea, bilateral ronchus  Cardiovascular system: S 1, S 2 RRR Gastrointestinal system: BS present, soft nt Central nervous system:  Non focal.  Extremities: symmetric power Skin: No rashes, lesions or ulcers   Data Reviewed: I have personally reviewed following  labs and imaging studies  CBC: Recent Labs  Lab 04/11/19 0237 04/12/19 0332 04/13/19 0419 04/14/19 0036 04/15/19 0400  WBC 8.1 9.7 7.6 6.9 6.7  HGB 12.6* 14.4 13.0 12.4* 12.6*  HCT 39.4 45.8 40.9 39.0 39.3  MCV 99.2 100.0 98.8 98.2 98.7  PLT 202 234 199 202 222   Basic Metabolic  Panel: Recent Labs  Lab 04/11/19 0237 04/12/19 0332 04/13/19 0419 04/14/19 0036 04/15/19 0400  NA 134* 134* 134* 135 135  K 4.7 5.4* 4.6 4.4 4.3  CL 90* 88* 88* 88* 90*  CO2 37* 36* 39* 35* 40*  GLUCOSE 92 82 84 80 80  BUN 15 22 22 15 12   CREATININE 0.72 0.93 0.78 0.61 0.58*  CALCIUM 8.9 9.1 8.7* 8.8* 9.0  MG 1.6* 1.7 1.6* 1.6* 1.7   GFR: Estimated Creatinine Clearance: 65.4 mL/min (A) (by C-G formula based on SCr of 0.58 mg/dL (L)). Liver Function Tests: No results for input(s): AST, ALT, ALKPHOS, BILITOT, PROT, ALBUMIN in the last 168 hours. No results for input(s): LIPASE, AMYLASE in the last 168 hours. No results for input(s): AMMONIA in the last 168 hours. Coagulation Profile: No results for input(s): INR, PROTIME in the last 168 hours. Cardiac Enzymes: No results for input(s): CKTOTAL, CKMB, CKMBINDEX, TROPONINI in the last 168 hours. BNP (last 3 results) No results for input(s): PROBNP in the last 8760 hours. HbA1C: No results for input(s): HGBA1C in the last 72 hours. CBG: No results for input(s): GLUCAP in the last 168 hours. Lipid Profile: No results for input(s): CHOL, HDL, LDLCALC, TRIG, CHOLHDL, LDLDIRECT in the last 72 hours. Thyroid Function Tests: No results for input(s): TSH, T4TOTAL, FREET4, T3FREE, THYROIDAB in the last 72 hours. Anemia Panel: No results for input(s): VITAMINB12, FOLATE, FERRITIN, TIBC, IRON, RETICCTPCT in the last 72 hours. Sepsis Labs: No results for input(s): PROCALCITON, LATICACIDVEN in the last 168 hours.  Recent Results (from the past 240 hour(s))  SARS Coronavirus 2 [facility] order, Performed in [facility] hospital lab) Nasopharyngeal Nasopharyngeal Swab     Status: None   Collection Time: 04/09/19  8:42 AM   Specimen: Nasopharyngeal Swab  Result Value Ref Range Status   SARS Coronavirus 2 NEGATIVE NEGATIVE Final    Comment: (NOTE) If result is NEGATIVE SARS-CoV-2 target nucleic acids are NOT DETECTED. The SARS-CoV-2 RNA  is generally detectable in upper and lower  respiratory specimens during the acute phase of infection. The lowest  concentration of SARS-CoV-2 viral copies this assay can detect is 250  copies / mL. A negative result does not preclude SARS-CoV-2 infection  and should not be used as the sole basis for treatment or other  patient management decisions.  A negative result may occur with  improper specimen collection / handling, submission of specimen other  than nasopharyngeal swab, presence of viral mutation(s) within the  areas targeted by this assay, and inadequate number of viral copies  (<250 copies / mL). A negative result must be combined with clinical  observations, patient history, and epidemiological information. If result is POSITIVE SARS-CoV-2 target nucleic acids are DETECTED. The SARS-CoV-2 RNA is generally detectable in upper and lower  respiratory specimens dur ing the acute phase of infection.  Positive  results are indicative of active infection with SARS-CoV-2.  Clinical  correlation with patient history and other diagnostic information is  necessary to determine patient infection status.  Positive results do  not rule out bacterial infection or co-infection with other viruses. If result is PRESUMPTIVE POSTIVE SARS-CoV-2  nucleic acids MAY BE PRESENT.   A presumptive positive result was obtained on the submitted specimen  and confirmed on repeat testing.  While 2019 novel coronavirus  (SARS-CoV-2) nucleic acids may be present in the submitted sample  additional confirmatory testing may be necessary for epidemiological  and / or clinical management purposes  to differentiate between  SARS-CoV-2 and other Sarbecovirus currently known to infect humans.  If clinically indicated additional testing with an alternate test  methodology [phone]([id number]) is advised. The SARS-CoV-2 RNA is generally  detectable in upper and lower respiratory sp ecimens during the acute  phase of  infection. The expected result is Negative. Fact Sheet for Patients:  BoilerBrush.com.cyhttps://www.fda.gov/media/136312/download Fact Sheet for Healthcare Providers: https://pope.com/https://www.fda.gov/media/136313/download This test is not yet approved or cleared by the [place] FDA and has been authorized for detection and/or diagnosis of SARS-CoV-2 by FDA under an Emergency Use Authorization (EUA).  This EUA will remain in effect (meaning this test can be used) for the duration of the COVID-19 declaration under Section 564(b)(1) of the Act, 21 U.S.C. section 360bbb-3(b)(1), unless the authorization is terminated or revoked sooner. Performed at [facility], [address] [place], [place] [address]   Surgical pcr screen     Status: None   Collection Time: 04/10/19 10:10 PM   Specimen: Nasal Mucosa; Nasal Swab  Result Value Ref Range Status   MRSA, PCR NEGATIVE NEGATIVE Final   Staphylococcus aureus NEGATIVE NEGATIVE Final    Comment: (NOTE) The Xpert SA Assay (FDA approved for NASAL specimens in patients 66 years of age and older), is one component of a comprehensive surveillance program. It is not intended to diagnose infection nor to guide or monitor treatment. Performed at [facility], [address] [place], [place] [address]          Radiology Studies: Dg Chest 2 View  Result Date: 04/17/2019 CLINICAL DATA:  Follow-up chest tube removal, shortness of breath EXAM: CHEST - 2 VIEW COMPARISON:  04/16/2019 FINDINGS: Previously seen right-sided chest tube has been removed in the interval. Emphysematous changes remain. No discrete pneumothorax is seen. Endobronchial valves remain on the right. Improved aeration in the right base is noted. The left lung remains hyperinflated but clear. Cardiac shadow is stable. IMPRESSION: No definitive recurrent pneumothorax following chest tube removal. Persistent emphysematous changes are noted. Electronically Signed   By: Alcide CleverMark  Lukens M.D.   On: 04/17/2019 10:02    Dg Chest Port 1 View  Result Date: 04/16/2019 CLINICAL DATA:  Follow-up chest tube EXAM: PORTABLE CHEST 1 VIEW COMPARISON:  04/15/2019 FINDINGS: Cardiac shadow is stable. Aortic calcifications are again seen. Right-sided pigtail catheter is noted in satisfactory position. No definitive pneumothorax is seen at this time. Persistent emphysematous changes are noted. Endobronchial valves are seen on the right and stable. Persistent opacity in the right lung base is seen. Left lung remains clear. No bony abnormality is noted. IMPRESSION: Chest tube in place without pneumothorax. Persistent right basilar opacity Electronically Signed   By: Alcide CleverMark  Lukens M.D.   On: 04/16/2019 09:01        Scheduled Meds:  acetaminophen  650 mg Oral Q6H   Or   acetaminophen  650 mg Rectal Q6H   carvedilol  3.125 mg Oral BID   docusate sodium  100 mg Oral BID   feeding supplement (ENSURE ENLIVE)  237 mL Oral TID BM   fluticasone  2 spray Each Nare Daily   furosemide  40 mg Oral Daily   influenza vaccine  adjuvanted  0.5 mL Intramuscular Tomorrow-1000   lidocaine  1 patch Transdermal Q24H   lisinopril  2.5 mg Oral Daily   mouth rinse  15 mL Mouth Rinse BID   multivitamin with minerals  1 tablet Oral Daily   potassium chloride SA  20 mEq Oral Daily   sodium chloride flush  3 mL Intravenous Q12H   Continuous Infusions:  sodium chloride       LOS: 8 days    Time spent: 35 minutes    Alba Cory, MD [facility] Pager [phone]  If 7PM-7AM, please contact night-coverage [email or web address] Password [email or web address] 04/17/2019, 3:15 PM

## 2019-04-17 NOTE — Evaluation (Signed)
Physical Therapy Evaluation Patient Details Name: Derrick [name] MRN: [id number] DOB: [birth year] Today's Date: 04/17/2019   History of Present Illness  Patient is a 68 year old male admitted to [facility] ED with SOB. Found to have R sided pneumothorax, which he was here for same at end of August. Transferred here for interbroncial Valve placement. S/P chest tube. PMH to include: COPD,on home O2, heavy tobacco user, diastolic CHF, RA.  Clinical Impression  Patient received in bed, pleasant gentleman who agrees to PT evaluation. Reports he is breathing about the same. O2 sats prior to walking 93% on 6lpm. Patient requires increased time with mobility but no physical assist with bed mobility or transfers. Ambulated 20 feet, no AD. One mild lob initially in which he was able to reach out for sink and steady himself. After standing rest break, ambulated another 20 feet. Patient will benefit from continued skilled PT to improve safety with mobility and improve strength.       Follow Up Recommendations Home health PT    Equipment Recommendations  None recommended by PT    Recommendations for Other Services       Precautions / Restrictions Precautions Precautions: Fall Precaution Comments: mod fall Restrictions Weight Bearing Restrictions: No      Mobility  Bed Mobility Overal bed mobility: Independent             General bed mobility comments: hob elevated, no difficulty  Transfers Overall transfer level: Needs assistance Equipment used: None Transfers: Sit to/from Stand Sit to Stand: Supervision         General transfer comment: no difficulty, pushed up from bed  Ambulation/Gait Ambulation/Gait assistance: Min guard Gait Distance (Feet): 40 Feet Assistive device: None Gait Pattern/deviations: Decreased stride length Gait velocity: decreased   General Gait Details: slight LOB with initial walking requiring patient to reach out for sink counter. Patient with limited  activity tolerance due to SOB.  Stairs            Wheelchair Mobility    Modified Rankin (Stroke Patients Only)       Balance Overall balance assessment: Mild deficits observed, not formally tested                                           Pertinent Vitals/Pain Pain Assessment: No/denies pain    Home Living Family/patient expects to be discharged to:: Private residence Living Arrangements: Parent Available Help at Discharge: Available PRN/intermittently;Family Type of Home: House Home Access: Level entry     Home Layout: One level Home Equipment: Cane - single point      Prior Function Level of Independence: Independent;Independent with assistive device(s)         Comments: has not been driving since last admission in August. Brother and sister come daily to assist patient with needs     Hand Dominance        Extremity/Trunk Assessment   Upper Extremity Assessment Upper Extremity Assessment: Overall WFL for tasks assessed    Lower Extremity Assessment Lower Extremity Assessment: Overall WFL for tasks assessed    Cervical / Trunk Assessment Cervical / Trunk Assessment: Normal  Communication   Communication: HOH  Cognition Arousal/Alertness: Awake/alert Behavior During Therapy: WFL for tasks assessed/performed Overall Cognitive Status: Within Functional Limits for tasks assessed  General Comments      Exercises     Assessment/Plan    PT Assessment Patient needs continued PT services  PT Problem List Decreased strength;Decreased mobility;Decreased activity tolerance;Decreased balance;Cardiopulmonary status limiting activity       PT Treatment Interventions Therapeutic activities;Gait training;Therapeutic exercise;Functional mobility training;Balance training;Patient/family education    PT Goals (Current goals can be found in the Care Plan section)  Acute Rehab PT  Goals Patient Stated Goal: Hopes to be home soon PT Goal Formulation: With patient Time For Goal Achievement: 04/24/19 Potential to Achieve Goals: Good    Frequency Min 3X/week   Barriers to discharge        Co-evaluation               AM-PAC PT "6 Clicks" Mobility  Outcome Measure Help needed turning from your back to your side while in a flat bed without using bedrails?: None Help needed moving from lying on your back to sitting on the side of a flat bed without using bedrails?: None Help needed moving to and from a bed to a chair (including a wheelchair)?: A Little Help needed standing up from a chair using your arms (e.g., wheelchair or bedside chair)?: A Little Help needed to walk in hospital room?: A Little Help needed climbing 3-5 steps with a railing? : A Little 6 Click Score: 20    End of Session Equipment Utilized During Treatment: Gait belt;Oxygen Activity Tolerance: Other (comment)(limited by SOB) Patient left: in bed;with call bell/phone within reach Nurse Communication: Mobility status PT Visit Diagnosis: Unsteadiness on feet (R26.81);Muscle weakness (generalized) (M62.81);Difficulty in walking, not elsewhere classified (R26.2)    Time: 7824-2353 PT Time Calculation (min) (ACUTE ONLY): 20 min   Charges:   PT Evaluation $PT Eval Moderate Complexity: 1 Mod PT Treatments $Gait Training: 8-22 mins        Sundeep Destin, PT, GCS 04/17/19,3:24 PM

## 2019-04-18 NOTE — Progress Notes (Signed)
PROGRESS NOTE    Derrick [name]  SNK:[id number] DOB: Jul 14, 1951 DOA: 04/09/2019 PCP: Lemmie Evens, MD   Brief Narrative: 68 year old with past medical history significant for COPD, pulmonary hypertension, heavy tobacco user, diastolic heart failure and rheumatoid arthritis with recurrent pneumothorax presents to [facility] initially with shortness of breath.  Patient was found to have a moderate sized right-sided pneumothorax.  Transferred to [facility] for further care and management by CVTS.  Patient underwent endobronchial valve placement on 9/4.   Assessment & Plan:   Principal Problem:   Pneumothorax on right Active Problems:   Pulmonary HTN (HCC)   Acute respiratory failure with hypoxia (HCC)   Spontaneous pneumothorax   Protein-calorie malnutrition, severe   Chest tube in place   Status post chest tube placement (HCC)   Weakness generalized   Nicotine dependence, chewing tobacco, uncomplicated   RA (rheumatoid arthritis) (HCC)   CHF (congestive heart failure) (HCC)   COPD (chronic obstructive pulmonary disease) ([place])   1-Acute hypoxic respiratory failure: Related to recurrent right side moderate pneumothorax. -In the setting of severe bullous emphysema. -Patient is status post endobronchial valve placement 9/4. -Chest tube management per CT surgery. -On lidocaine patch to help with pain at the chest tube site. -Chest tube remove 9/09. Follow up chest x ray today stable.  -Monitor for 48 hours, high risk for pneumothorax recurrence.  Will repeat chest x ray 9-12, if stable plan to discharge home tomorrow.   2-History of COPD with severe emphysema with active tobacco use -Stable. -Continue with nebulizers. -will refer to pulmonologist.  -poor prognosis, he was evaluated by palliative care on 8-19.  3-Chronic diastolic congestive heart failure with preserved ejection fraction, 60%: Continue with Lasix, lisinopril, carvedilol. Severe protein calorie  malnutrition: Continue with Ensure.  Severe malnutrition;  Continue with ensure.       Nutrition Problem: Severe Malnutrition Etiology: chronic illness(COPD/recurrent R pneumo)    Signs/Symptoms: severe fat depletion, severe muscle depletion    Interventions: Ensure Enlive (each supplement provides 350kcal and 20 grams of protein), MVI  Estimated body mass index is 15.8 kg/m as calculated from the following:   Height as of this encounter: 5\' 11"  (1.803 m).   Weight as of this encounter: 51.4 kg.   DVT prophylaxis: SCDs Code Status: DNR Family Communication: Care discussed with patient Disposition Plan: Remain in the hospital for treatment of pneumothorax, high risk for decompensation Consultants:   CVTS  Procedures:   Bronchial valve placement on 9/4  Antimicrobials:  None  Subjective: Report dyspnea is the same.   Objective: Vitals:   04/18/19 0451 04/18/19 0813 04/18/19 0821 04/18/19 1216  BP:   116/89 116/84  Pulse:  88 93 (!) 101  Resp:   20 18  Temp:  98.3 F (36.8 C) 98 F (36.7 C) 98.4 F (36.9 C)  TempSrc:  Oral Oral Oral  SpO2:  92% 92% 92%  Weight: 51.4 kg     Height:        Intake/Output Summary (Last 24 hours) at 04/18/2019 1230 Last data filed at 04/18/2019 1100 Gross per 24 hour  Intake 720 ml  Output 375 ml  Net 345 ml   Filed Weights   04/16/19 0420 04/17/19 0430 04/18/19 0451  Weight: 51.2 kg 50.9 kg 51.4 kg    Examination:  General exam: NAD Respiratory system: No wheezing.  Cardiovascular system: S 1, S 2 RRR Gastrointestinal system: BS present, soft, nt, nd Central nervous system:  Non focal.  Extremities: Symmetric  power  Skin: no rashes.    Data Reviewed: I have personally reviewed following labs and imaging studies  CBC: Recent Labs  Lab 04/12/19 0332 04/13/19 0419 04/14/19 0036 04/15/19 0400  WBC 9.7 7.6 6.9 6.7  HGB 14.4 13.0 12.4* 12.6*  HCT 45.8 40.9 39.0 39.3  MCV 100.0 98.8 98.2 98.7  PLT 234  199 202 199   Basic Metabolic Panel: Recent Labs  Lab 04/12/19 0332 04/13/19 0419 04/14/19 0036 04/15/19 0400  NA 134* 134* 135 135  K 5.4* 4.6 4.4 4.3  CL 88* 88* 88* 90*  CO2 36* 39* 35* 40*  GLUCOSE 82 84 80 80  BUN 22 22 15 12   CREATININE 0.93 0.78 0.61 0.58*  CALCIUM 9.1 8.7* 8.8* 9.0  MG 1.7 1.6* 1.6* 1.7   GFR: Estimated Creatinine Clearance: 66 mL/min (A) (by C-G formula based on SCr of 0.58 mg/dL (L)). Liver Function Tests: No results for input(s): AST, ALT, ALKPHOS, BILITOT, PROT, ALBUMIN in the last 168 hours. No results for input(s): LIPASE, AMYLASE in the last 168 hours. No results for input(s): AMMONIA in the last 168 hours. Coagulation Profile: No results for input(s): INR, PROTIME in the last 168 hours. Cardiac Enzymes: No results for input(s): CKTOTAL, CKMB, CKMBINDEX, TROPONINI in the last 168 hours. BNP (last 3 results) No results for input(s): PROBNP in the last 8760 hours. HbA1C: No results for input(s): HGBA1C in the last 72 hours. CBG: No results for input(s): GLUCAP in the last 168 hours. Lipid Profile: No results for input(s): CHOL, HDL, LDLCALC, TRIG, CHOLHDL, LDLDIRECT in the last 72 hours. Thyroid Function Tests: No results for input(s): TSH, T4TOTAL, FREET4, T3FREE, THYROIDAB in the last 72 hours. Anemia Panel: No results for input(s): VITAMINB12, FOLATE, FERRITIN, TIBC, IRON, RETICCTPCT in the last 72 hours. Sepsis Labs: No results for input(s): PROCALCITON, LATICACIDVEN in the last 168 hours.  Recent Results (from the past 240 hour(s))  SARS Coronavirus 2 [facility] order, Performed in [facility] hospital lab) Nasopharyngeal Nasopharyngeal Swab     Status: None   Collection Time: 04/09/19  8:42 AM   Specimen: Nasopharyngeal Swab  Result Value Ref Range Status   SARS Coronavirus 2 NEGATIVE NEGATIVE Final    Comment: (NOTE) If result is NEGATIVE SARS-CoV-2 target nucleic acids are NOT DETECTED. The SARS-CoV-2 RNA is generally  detectable in upper and lower  respiratory specimens during the acute phase of infection. The lowest  concentration of SARS-CoV-2 viral copies this assay can detect is 250  copies / mL. A negative result does not preclude SARS-CoV-2 infection  and should not be used as the sole basis for treatment or other  patient management decisions.  A negative result may occur with  improper specimen collection / handling, submission of specimen other  than nasopharyngeal swab, presence of viral mutation(s) within the  areas targeted by this assay, and inadequate number of viral copies  (<250 copies / mL). A negative result must be combined with clinical  observations, patient history, and epidemiological information. If result is POSITIVE SARS-CoV-2 target nucleic acids are DETECTED. The SARS-CoV-2 RNA is generally detectable in upper and lower  respiratory specimens dur ing the acute phase of infection.  Positive  results are indicative of active infection with SARS-CoV-2.  Clinical  correlation with patient history and other diagnostic information is  necessary to determine patient infection status.  Positive results do  not rule out bacterial infection or co-infection with other viruses. If result is PRESUMPTIVE POSTIVE SARS-CoV-2 nucleic acids MAY  BE PRESENT.   A presumptive positive result was obtained on the submitted specimen  and confirmed on repeat testing.  While 2019 novel coronavirus  (SARS-CoV-2) nucleic acids may be present in the submitted sample  additional confirmatory testing may be necessary for epidemiological  and / or clinical management purposes  to differentiate between  SARS-CoV-2 and other Sarbecovirus currently known to infect humans.  If clinically indicated additional testing with an alternate test  methodology [phone]) is advised. The SARS-CoV-2 RNA is generally  detectable in upper and lower respiratory sp ecimens during the acute  phase of infection. The  expected result is Negative. Fact Sheet for Patients:  BoilerBrush.com.cy Fact Sheet for Healthcare Providers: https://pope.com/ This test is not yet approved or cleared by the [place] FDA and has been authorized for detection and/or diagnosis of SARS-CoV-2 by FDA under an Emergency Use Authorization (EUA).  This EUA will remain in effect (meaning this test can be used) for the duration of the COVID-19 declaration under Section 564(b)(1) of the Act, 21 U.S.C. section 360bbb-3(b)(1), unless the authorization is terminated or revoked sooner. Performed at [facility], [address], [place], [place] [address]   Surgical pcr screen     Status: None   Collection Time: 04/10/19 10:10 PM   Specimen: Nasal Mucosa; Nasal Swab  Result Value Ref Range Status   MRSA, PCR NEGATIVE NEGATIVE Final   Staphylococcus aureus NEGATIVE NEGATIVE Final    Comment: (NOTE) The Xpert SA Assay (FDA approved for NASAL specimens in patients 72 years of age and older), is one component of a comprehensive surveillance program. It is not intended to diagnose infection nor to guide or monitor treatment. Performed at [facility], [address], [place], [place] [address]          Radiology Studies: Dg Chest 2 View  Result Date: 04/17/2019 CLINICAL DATA:  Follow-up chest tube removal, shortness of breath EXAM: CHEST - 2 VIEW COMPARISON:  04/16/2019 FINDINGS: Previously seen right-sided chest tube has been removed in the interval. Emphysematous changes remain. No discrete pneumothorax is seen. Endobronchial valves remain on the right. Improved aeration in the right base is noted. The left lung remains hyperinflated but clear. Cardiac shadow is stable. IMPRESSION: No definitive recurrent pneumothorax following chest tube removal. Persistent emphysematous changes are noted. Electronically Signed   By: Alcide Clever M.D.   On: 04/17/2019 10:02         Scheduled Meds: . acetaminophen  650 mg Oral Q6H   Or  . acetaminophen  650 mg Rectal Q6H  . carvedilol  3.125 mg Oral BID  . docusate sodium  100 mg Oral BID  . feeding supplement (ENSURE ENLIVE)  237 mL Oral TID BM  . fluticasone  2 spray Each Nare Daily  . furosemide  40 mg Oral Daily  . influenza vaccine adjuvanted  0.5 mL Intramuscular Tomorrow-1000  . lidocaine  1 patch Transdermal Q24H  . lisinopril  2.5 mg Oral Daily  . mouth rinse  15 mL Mouth Rinse BID  . multivitamin with minerals  1 tablet Oral Daily  . potassium chloride SA  20 mEq Oral Daily  . sodium chloride flush  3 mL Intravenous Q12H   Continuous Infusions: . sodium chloride       LOS: 9 days    Time spent: 35 minutes    Alba Cory, MD [facility] Pager [phone]  If 7PM-7AM, please contact night-coverage [email or web address] Password [email or web address] 04/18/2019, 12:30 PM

## 2019-04-18 NOTE — TOC Initial Note (Signed)
Transition of Care (TOC) - Initial/Assessment Note  Marvetta Gibbons RN, BSN Transitions of Care Unit 4E- RN Case Manager [phone]   Patient Details  Name: AZAM GERVASI MRN: [id number] Date of Birth: 04/16/1951  Transition of Care [facility]) CM/SW Contact:    Dawayne Patricia, RN Phone Number: 04/18/2019, 2:43 PM  Clinical Narrative:                 Pt admitted with pntx- chest tube placed and s/p bronchial valve placement- chest tube has been removed - PTA pt lived at home and was active with [facility] for HHRN/PT/OT- orders have been placed to resume [facility] services- RN/PT/OT/aide- pt has home 02 with [facility]- (baseline 5-6L)- contacted Butch Penny with [facility] for resumption of [place] services at time to discharge.    Expected Discharge Plan: [place] Barriers to Discharge: No Barriers Identified   Patient Goals and CMS Choice Patient states their goals for this hospitalization and ongoing recovery are:: return home      Expected Discharge Plan and Services Expected Discharge Plan: [place]   Discharge Planning Services: CM Consult Post Acute Care Choice: Home Health, Resumption of Svcs/PTA Provider Living arrangements for the past 2 months: Single Family Home                 DME Arranged: N/A DME Agency: [facility]       HH Arranged: RN, PT, OT, Nurse's Aide [place] Agency: [place] ([facility]) Date HH Agency Contacted: 04/18/19 Time [place]: 21 Representative spoke with at [place]: Butch Penny  Prior Living Arrangements/Services Living arrangements for the past 2 months: Single Family Home Lives with:: Self Patient language and need for interpreter reviewed:: Yes Do you feel safe going back to the place where you live?: Yes      Need for Family Participation in Patient Care: Yes (Comment) Care giver support system in place?: Yes (comment) Current home services: DME, Home RN Criminal Activity/Legal Involvement Pertinent to Current  Situation/Hospitalization: No - Comment as needed  Activities of Daily Living Home Assistive Devices/Equipment: Oxygen ADL Screening (condition at time of admission) Patient's cognitive ability adequate to safely complete daily activities?: Yes Is the patient deaf or have difficulty hearing?: No Does the patient have difficulty seeing, even when wearing glasses/contacts?: No Does the patient have difficulty concentrating, remembering, or making decisions?: No Patient able to express need for assistance with ADLs?: Yes Does the patient have difficulty dressing or bathing?: No Independently performs ADLs?: Yes (appropriate for developmental age) Does the patient have difficulty walking or climbing stairs?: No Weakness of Legs: None Weakness of Arms/Hands: None  Permission Sought/Granted Permission sought to share information with : Chartered certified accountant granted to share information with : Yes, Verbal Permission Granted     Permission granted to share info w AGENCY: Home Health        Emotional Assessment Appearance:: Appears stated age Attitude/Demeanor/Rapport: Gracious Affect (typically observed): Appropriate Orientation: : Oriented to Self, Oriented to Place, Oriented to  Time, Oriented to Situation Alcohol / Substance Use: Not Applicable Psych Involvement: No (comment)  Admission diagnosis:  Respiratory distress [R06.03] Pneumothorax on right [J93.9] COPD, severe ([place]) [J44.9] Patient Active Problem List   Diagnosis Date Noted  . Pneumothorax on right 04/09/2019  . CHF (congestive heart failure) ([place])   . Palliative care by specialist   . DNR (do not resuscitate) discussion   . Weakness generalized   . Protein-calorie malnutrition, severe 03/22/2019  .  Chest tube in place   . Status post chest tube placement (HCC)   . Spontaneous pneumothorax 03/20/2019  . Acute respiratory failure with hypoxia (HCC) 10/04/2018  . Cellulitis 10/04/2018  . Acute  on chronic diastolic CHF (congestive heart failure) (HCC) 10/04/2018  . Pulmonary HTN (HCC) 07/08/2017  . COPD exacerbation (HCC) 07/06/2017  . COPD (chronic obstructive pulmonary disease) (HCC) 03/14/2013  . RA (rheumatoid arthritis) (HCC) 09/08/2009  . Nicotine dependence, chewing tobacco, uncomplicated 08/07/1968   PCP:  Gareth Morgan, MD Pharmacy:   [facility] [phone] - [place], [place] - [address] AT [facility] [address] [place] [address] Phone: [phone] Fax: [phone]     Social Determinants of Health (SDOH) Interventions    Readmission Risk Interventions No flowsheet data found.

## 2019-04-18 NOTE — Care Management Important Message (Signed)
Important Message  Patient Details  Name: Derrick [name] MRN: [id number] Date of Birth: Dec 21, 1950   Medicare Important Message Given:  Yes     [name] 04/18/2019, 3:01 PM

## 2019-04-18 NOTE — Plan of Care (Signed)
  Problem: Education: Goal: Knowledge of General Education information will improve Description Including pain rating scale, medication(s)/side effects and non-pharmacologic comfort measures Outcome: Progressing   Problem: Health Behavior/Discharge Planning: Goal: Ability to manage health-related needs will improve Outcome: Progressing   

## 2019-04-19 ENCOUNTER — Inpatient Hospital Stay (HOSPITAL_COMMUNITY): Payer: Medicare HMO

## 2019-04-19 MED ORDER — IPRATROPIUM BROMIDE HFA 17 MCG/ACT IN AERS: 1.0000 | INHALATION_SPRAY | Freq: Three times a day (TID) | RESPIRATORY_TRACT | 2 refills | Status: AC

## 2019-04-19 MED ORDER — ADULT MULTIVITAMIN W/MINERALS CH: 1.0000 | ORAL_TABLET | Freq: Every day | ORAL | 0 refills | Status: AC

## 2019-04-19 MED ORDER — SENNOSIDES-DOCUSATE SODIUM 8.6-50 MG PO TABS: 2.0000 | ORAL_TABLET | Freq: Every evening | ORAL | 0 refills | Status: DC | PRN

## 2019-04-19 MED ORDER — DOCUSATE SODIUM 100 MG PO CAPS: 100.0000 mg | ORAL_CAPSULE | Freq: Two times a day (BID) | ORAL | 0 refills | Status: DC

## 2019-04-19 MED ORDER — ENSURE ENLIVE PO LIQD: 237.0000 mL | Freq: Three times a day (TID) | ORAL | 12 refills | Status: AC

## 2019-04-19 NOTE — Discharge Summary (Signed)
Physician Discharge Summary  BIRCH FARINO EAV:[id number] DOB: [birth year] DOA: 04/09/2019  PCP: Gareth Morgan, MD  Admit date: 04/09/2019 Discharge date: 04/19/2019  Admitted From: Home  Disposition:  Home   Recommendations for Outpatient Follow-up:  1. Follow up with PCP in 1-2 weeks 2. Please obtain BMP/CBC in one week 3. Needs chest x ray to follow up Pneumothorax.  4. Consider further conversation in regards palliative care.   Home Health; yes.  Discharge Condition: Stable.  CODE STATUS: DNR Diet recommendation: Heart Healthy  Brief/Interim Summary: 68 year old with past medical history significant for COPD, pulmonary hypertension, heavy tobacco user, diastolic heart failure and rheumatoid arthritis with recurrent pneumothorax presents to [facility] initially with shortness of breath.  Patient was found to have a moderate sized right-sided pneumothorax.  Transferred to [facility] for further care and management by CVTS.  Patient underwent endobronchial valve placement on 9/4.   1-Acute hypoxic respiratory failure: Related to recurrent right side moderate pneumothorax. -In the setting of severe bullous emphysema. -Patient is status post endobronchial valve placement 9/4. -Chest tube management per CT surgery. -On lidocaine patch to help with pain at the chest tube site. -Chest tube remove 9/09. Follow up chest x ray today stable.  -Patient was Monitor for 48 hours, high risk for pneumothorax recurrence post chest tube removal.  -repeated chest x ray 9/12 no evidence of Pneumothorax.  -stable for discharge today. Needs to follow up with DR Cliffton Asters next week.   2-History of COPD with severe emphysema with active tobacco use -Stable. -Continue with nebulizers. -will refer to pulmonologist.  -poor prognosis, he was evaluated by palliative care on 8-19. -needs to follow up with pulmonologist.   3-Chronic diastolic congestive heart failure with preserved ejection  fraction, 60%: Continue with Lasix, lisinopril, carvedilol. Severe protein calorie malnutrition: Continue with Ensure.  Severe malnutrition;  Continue with ensure.      Discharge Diagnoses:  Principal Problem:   Pneumothorax on right Active Problems:   Pulmonary HTN (HCC)   Acute respiratory failure with hypoxia (HCC)   Spontaneous pneumothorax   Protein-calorie malnutrition, severe   Chest tube in place   Status post chest tube placement (HCC)   Weakness generalized   Nicotine dependence, chewing tobacco, uncomplicated   RA (rheumatoid arthritis) (HCC)   CHF (congestive heart failure) (HCC)   COPD (chronic obstructive pulmonary disease) (HCC)    Discharge Instructions  Discharge Instructions    Diet - low sodium heart healthy   Complete by: As directed    Increase activity slowly   Complete by: As directed      Allergies as of 04/19/2019   No Known Allergies     Medication List    TAKE these medications   albuterol 108 (90 Base) MCG/ACT inhaler Commonly known as: VENTOLIN HFA Inhale 1-2 puffs into the lungs every 6 (six) hours as needed for wheezing or shortness of breath.   albuterol (2.5 MG/3ML) 0.083% nebulizer solution Commonly known as: PROVENTIL Take 3 mLs (2.5 mg total) by nebulization every 4 (four) hours as needed for wheezing or shortness of breath.   carvedilol 3.125 MG tablet Commonly known as: COREG Take 1 tablet (3.125 mg total) by mouth 2 (two) times daily.   [employer] Vitamin B12 1000 MCG tablet Generic drug: cyanocobalamin Take 1,000 mcg by mouth daily.   docusate sodium 100 MG capsule Commonly known as: COLACE Take 1 capsule (100 mg total) by mouth 2 (two) times daily.   feeding supplement (ENSURE ENLIVE) Liqd Take  237 mLs by mouth 3 (three) times daily between meals.   fluticasone 50 MCG/ACT nasal spray Commonly known as: FLONASE Place 1 spray into both nostrils daily. What changed: how much to take   furosemide 40 MG  tablet Commonly known as: LASIX 1 (40 mg) Tab po qam and half- Tab (20 mg) qpm What changed:   how much to take  how to take this  when to take this  additional instructions   ipratropium 17 MCG/ACT inhaler Commonly known as: ATROVENT HFA Inhale 1 puff into the lungs 3 (three) times daily.   lisinopril 2.5 MG tablet Commonly known as: ZESTRIL Take 1 tablet (2.5 mg total) by mouth daily.   multivitamin with minerals Tabs tablet Take 1 tablet by mouth daily.   potassium chloride SA 20 MEQ tablet Commonly known as: Klor-Con M20 Take 1 tablet (20 mEq total) by mouth daily.   senna-docusate 8.6-50 MG tablet Commonly known as: Senokot-S Take 2 tablets by mouth at bedtime as needed for mild constipation or moderate constipation.      Follow-up Information    Corliss SkainsLightfoot, Harrell O, MD Follow up on 04/25/2019.   Specialty: Thoracic Surgery Why: PA/LAT CX[facility] taken [facility] in the same building as Dr. Lucilla LameLightfoot's office) on 09/18 at 1:00 pm;Appointment time is at 1:30 pm Contact information: [address] [place] [address] [phone]        [facility], [facility] Follow up.   Specialty: Home Health Services Why: HHRN/PT/OT/aide- services to resume         No Known Allergies  Consultations:  Dr Cliffton AstersLightfoot.    Procedures/Studies: Dg Chest 1 View  Result Date: 03/25/2019 CLINICAL DATA:  Shortness of breath.  Recent pneumothorax. EXAM: CHEST  1 VIEW COMPARISON:  Chest x-rays dated 08/17 2020 and 03/20/2019 FINDINGS: The patient has recurrent large right pneumothorax at least 50%. There now appears to be slight shift of the heart mediastinal structures to the left suggesting tension. Extensive emphysematous changes noted on the left. Heart size and vascularity are normal. No acute bone abnormality. IMPRESSION: Recurrent large right pneumothorax with suggestion of tension. Critical Value/emergent results were called by  telephone at the time of interpretation on 03/25/2019 at 6:01 pm to [facility]isha, [place] , who verbally acknowledged these results. Electronically Signed   By: Francene BoyersJames  Maxwell M.D.   On: 03/25/2019 18:04   Dg Chest 2 View  Result Date: 04/19/2019 CLINICAL DATA:  68 year old male with history of pneumothorax. EXAM: CHEST - 2 VIEW COMPARISON:  Chest x-ray 04/17/2019. FINDINGS: Lungs are hyperexpanded with extensive emphysematous changes. No pneumothorax. No acute consolidative airspace disease. No pleural effusions. No evidence of pulmonary edema. Mild cardiomegaly. Central pulmonary arteries are markedly dilated, suggestive of pulmonary arterial hypertension. Upper mediastinal contours are within normal limits. Multiple right-sided endobronchial valves are again noted. IMPRESSION: 1. No radiographic evidence of acute cardiopulmonary disease. 2. Emphysema. 3. Dilatation of the central pulmonary arteries, concerning for pulmonary arterial hypertension. Electronically Signed   By: Trudie Reedaniel  Entrikin M.D.   On: 04/19/2019 07:53   Dg Chest 2 View  Result Date: 04/17/2019 CLINICAL DATA:  Follow-up chest tube removal, shortness of breath EXAM: CHEST - 2 VIEW COMPARISON:  04/16/2019 FINDINGS: Previously seen right-sided chest tube has been removed in the interval. Emphysematous changes remain. No discrete pneumothorax is seen. Endobronchial valves remain on the right. Improved aeration in the right base is noted. The left lung remains hyperinflated but clear. Cardiac shadow is  stable. IMPRESSION: No definitive recurrent pneumothorax following chest tube removal. Persistent emphysematous changes are noted. Electronically Signed   By: Alcide CleverMark  Lukens M.D.   On: 04/17/2019 10:02   Ct Chest Wo Contrast  Result Date: 03/20/2019 CLINICAL DATA:  Follow-up for resolution of pneumothorax. EXAM: CT CHEST WITHOUT CONTRAST TECHNIQUE: Multidetector CT imaging of the chest was performed following the standard protocol without IV contrast.  COMPARISON:  Radiographs earlier this day. FINDINGS: Cardiovascular: Moderate aortic atherosclerosis. Aortic tortuosity with ectasia of the descending thoracic aorta, 3.8 cm. Dilated main pulmonary artery at 4 cm. Coronary artery calcifications. Heart is normal in size. No pericardial effusion. Mediastinum/Nodes: No enlarged mediastinal lymph nodes. Limited assessment for hilar adenopathy given lack of IV contrast. The esophagus is decompressed. No visualized thyroid nodule. Lungs/Pleura: Right pigtail catheter in place, tip along the major fissure. Advanced emphysema with large apical bulla, air-filled structure in the right lung apex felt to represent a large bulla rather than residual pneumothorax. Small partially loculated residual pneumothorax noted anterior inferiorly anterior to the right middle lobe. Right lower lobe opacity favoring atelectasis. Large bulla also noted in the left upper lobe. Subsegmental atelectasis in the left upper lobe. No pleural fluid or pulmonary mass. Upper Abdomen: No acute findings. Atherosclerosis of upper abdominal vasculature. Musculoskeletal: There are no acute or suspicious osseous abnormalities. IMPRESSION: 1. Right pigtail catheter in place, tip along the major fissure. Small partially loculated pneumothorax anterior inferiorly to the right middle lobe. 2. Advanced emphysema with large apical bulla. Air-filled structure in the right lung apex felt to represent a large bulla rather than residual pneumothorax. Right lower lobe opacity favoring atelectasis. 3. Aortic atherosclerosis, ectatic descending thoracic aorta at 3.7 cm. Coronary artery calcifications. 4. Dilated main pulmonary artery suggesting pulmonary arterial hypertension. Aortic Atherosclerosis (ICD10-I70.0) and Emphysema (ICD10-J43.9). Electronically Signed   By: Narda RutherfordMelanie  Sanford M.D.   On: 03/20/2019 18:37   Dg Chest Port 1 View  Result Date: 04/16/2019 CLINICAL DATA:  Follow-up chest tube EXAM: PORTABLE CHEST  1 VIEW COMPARISON:  04/15/2019 FINDINGS: Cardiac shadow is stable. Aortic calcifications are again seen. Right-sided pigtail catheter is noted in satisfactory position. No definitive pneumothorax is seen at this time. Persistent emphysematous changes are noted. Endobronchial valves are seen on the right and stable. Persistent opacity in the right lung base is seen. Left lung remains clear. No bony abnormality is noted. IMPRESSION: Chest tube in place without pneumothorax. Persistent right basilar opacity Electronically Signed   By: Alcide CleverMark  Lukens M.D.   On: 04/16/2019 09:01   Dg Chest Port 1 View  Result Date: 04/15/2019 CLINICAL DATA:  Right-sided pneumothorax. EXAM: PORTABLE CHEST 1 VIEW COMPARISON:  Radiograph of April 14, 2019. FINDINGS: Stable cardiomediastinal silhouette. Atherosclerosis of thoracic aorta is noted. Right-sided chest tube is unchanged in position. Severe emphysematous disease is noted throughout both lungs. No definite pneumothorax is noted. Stable right basilar opacity is noted concerning for atelectasis or possibly infiltrate. Small right pleural effusion may be present. Bony thorax unremarkable. IMPRESSION: Stable position of right-sided chest tube without definite pneumothorax. Severe emphysematous disease is noted bilaterally. Stable right basilar opacity is noted with small right pleural effusion. Aortic Atherosclerosis (ICD10-I70.0) and Emphysema (ICD10-J43.9). Electronically Signed   By: Lupita RaiderJames  Green Jr M.D.   On: 04/15/2019 07:42   Dg Chest Port 1 View  Result Date: 04/14/2019 CLINICAL DATA:  History of right pneumothorax.  Chest tube in place. EXAM: PORTABLE CHEST 1 VIEW COMPARISON:  Single-view of the chest 04/13/2019 and 04/10/2019. FINDINGS: Right chest  tube remains in place, unchanged. No pneumothorax. Lungs are emphysematous. Right basilar airspace opacity is mildly improved compared to yesterday's examination. Heart size is normal. Atherosclerosis. IMPRESSION: Negative  for right pneumothorax with a chest tube in place. Right basilar airspace disease has mildly improved since yesterday's examination. Emphysema. Atherosclerosis. Electronically Signed   By: Inge Rise M.D.   On: 04/14/2019 10:50   Dg Chest Port 1 View  Result Date: 04/13/2019 CLINICAL DATA:  Reason for exam: rt pneumothorax EXAM: PORTABLE CHEST 1 VIEW COMPARISON:  04/10/2019 FINDINGS: RIGHT-sided chest tube, tip at the apex is in unchanged. Interval placement of endobronchial valves in the RIGHT hilar region. Increased opacity within the RIGHT lung base and associated pleural effusion. Stable lucency at the RIGHT lung apex. LEFT lung is stable and clear. IMPRESSION: 1. Interval placement of endobronchial valves in the RIGHT hilar region. 2. Increased opacity at the RIGHT lung base and associated pleural effusion. Electronically Signed   By: Nolon Nations M.D.   On: 04/13/2019 10:26   Portable Chest 1 View  Result Date: 04/10/2019 CLINICAL DATA:  Follow-up pneumothorax EXAM: PORTABLE CHEST 1 VIEW COMPARISON:  04/09/2019 FINDINGS: Cardiac shadow is stable. Aortic calcifications are again seen. The lungs are hyperinflated. The left lung remains clear with emphysematous changes. Right-sided chest tube is again seen in the apex. No definitive pneumothorax is seen. Emphysematous changes are again noted. IMPRESSION: Changes of COPD.  No recurrent pneumothorax is noted. Electronically Signed   By: Inez Catalina M.D.   On: 04/10/2019 08:34   Dg Chest Port 1 View  Result Date: 04/09/2019 CLINICAL DATA:  Chest tube placement EXAM: PORTABLE CHEST 1 VIEW COMPARISON:  Earlier today at 0840 hours FINDINGS: 955 hours. Midline trachea. Mild cardiomegaly. No pleural fluid. Placement of a right-sided small bore chest tube. Re-expansion of the right lung, without residual pneumothorax. No lobar consolidation. Underlying hyperinflation consistent with COPD. Subcutaneous emphysema about the right chest. IMPRESSION:  Placement right-sided chest tube, with resolution of right-sided pneumothorax. Electronically Signed   By: Abigail Miyamoto M.D.   On: 04/09/2019 10:29   Dg Chest Port 1 View  Result Date: 04/09/2019 CLINICAL DATA:  Decreased breath sounds EXAM: PORTABLE CHEST 1 VIEW COMPARISON:  Multiple priors, most recent March 30, 2019 FINDINGS: There is a recurrence moderate right-sided pneumothorax. No shift of the mediastinal structures seen. The cardiomediastinal silhouette is unchanged from prior. The left lung is clear. IMPRESSION: Recurrence of moderate right pneumothorax. Critical Value/emergent results were called by telephone at the time of interpretation on 04/09/2019 at 9:08 am to Dr. Noemi Chapel , who verbally acknowledged these results. Electronically Signed   By: Prudencio Pair M.D.   On: 04/09/2019 09:08   Dg Chest Port 1 View  Result Date: 03/30/2019 CLINICAL DATA:  Dyspnea EXAM: PORTABLE CHEST 1 VIEW COMPARISON:  03/29/2019 FINDINGS: Lungs are hyperinflated. Marked emphysematous changes are identified at the apices, RIGHT greater than LEFT. No evidence for residual pneumothorax. Heart size is normal. Atherosclerotic calcification of the thoracic aorta. IMPRESSION: 1. COPD. 2. No evidence for acute cardiopulmonary abnormality. Electronically Signed   By: Nolon Nations M.D.   On: 03/30/2019 08:44   Dg Chest Port 1 View  Result Date: 03/29/2019 CLINICAL DATA:  Chest tube removal EXAM: PORTABLE CHEST 1 VIEW COMPARISON:  03/28/2019 FINDINGS: Trace pneumothorax at the medial right lung apex, unchanged. Bullous emphysematous changes in the upper lobes, right greater than left. No pleural effusion. The heart is normal in size.  Thoracic aortic atherosclerosis. IMPRESSION: Trace  pneumothorax at the medial right lung apex, unchanged. Electronically Signed   By: Charline Bills M.D.   On: 03/29/2019 09:59   Dg Chest Port 1 View  Result Date: 03/28/2019 CLINICAL DATA:  Encounter for chest tube removal. EXAM:  PORTABLE CHEST 1 VIEW COMPARISON:  Radiograph of same day. FINDINGS: The heart size and mediastinal contours are within normal limits. Atherosclerosis of thoracic aorta is noted. Stable emphysematous disease is noted in both lungs. Right-sided chest tube has been removed. Small right apical pneumothorax noted on prior exam is not significantly changed. No pleural effusion is noted. The visualized skeletal structures are unremarkable. IMPRESSION: Stable small right apical pneumothorax is noted status post chest tube removal. Aortic Atherosclerosis (ICD10-I70.0) and Emphysema (ICD10-J43.9). Electronically Signed   By: Lupita Raider M.D.   On: 03/28/2019 11:49   Dg Chest Port 1 View  Result Date: 03/28/2019 CLINICAL DATA:  Chest tube, pneumothorax EXAM: PORTABLE CHEST 1 VIEW COMPARISON:  03/27/2019 FINDINGS: Right chest tube remains in place, unchanged. Small right apical pneumothorax again noted, stable. There is hyperinflation of the lungs compatible with COPD. Heart is normal size. No effusions. IMPRESSION: Stable small right apical pneumothorax with right chest tube in stable position. COPD. Electronically Signed   By: Charlett Nose M.D.   On: 03/28/2019 08:13   Dg Chest Port 1 View  Result Date: 03/27/2019 CLINICAL DATA:  Chest tube EXAM: PORTABLE CHEST 1 VIEW COMPARISON:  03/25/2019 FINDINGS: Right chest tube remains in place. Small residual right pneumothorax is unchanged. There is hyperinflation of the lungs compatible with COPD. Heart is borderline in size. No confluent opacities or effusions. IMPRESSION: Stable small right apical pneumothorax with right chest tube in place. COPD Electronically Signed   By: Charlett Nose M.D.   On: 03/27/2019 10:53   Dg Chest Port 1 View  Result Date: 03/25/2019 CLINICAL DATA:  Chest tube EXAM: PORTABLE CHEST 1 VIEW COMPARISON:  Chest x-ray dated March 25, 2019 FINDINGS: There has been interval placement of a right-sided chest tube. There is a trace residual  right-sided pneumothorax. No left-sided pneumothorax. The lungs are hyperexpanded. Coarsened lung markings are noted bilaterally consistent with scarring. Large bowl are noted bilaterally. The heart size is normal two slightly enlarged. Aortic calcifications are noted. The pulmonary arteries are significantly enlarged as seen on prior CT. IMPRESSION: 1. Status post placement of a right-sided chest tube. There is a trace residual right-sided pneumothorax. 2. Multiple additional chronic findings as detailed above, similar across prior studies. Electronically Signed   By: Katherine Mantle M.D.   On: 03/25/2019 19:55   Dg Chest Port 1 View  Result Date: 03/24/2019 CLINICAL DATA:  Status post right chest tube removal EXAM: PORTABLE CHEST 1 VIEW COMPARISON:  Film from earlier in the same day. FINDINGS: Previously seen pigtail catheter has been removed in the interval. No significant recurrent pneumothorax is noted. Emphysematous changes are noted in the apices bilaterally. No new focal infiltrate is seen. No bony abnormality is noted. IMPRESSION: No pneumothorax following pigtail removal. Electronically Signed   By: Alcide Clever M.D.   On: 03/24/2019 16:07   Dg Chest Port 1 View  Result Date: 03/24/2019 CLINICAL DATA:  67 year old male with right chest tube EXAM: PORTABLE CHEST 1 VIEW COMPARISON:  Multiple prior most recent March 21, 2019 FINDINGS: Cardiomediastinal silhouette unchanged, incompletely imaged. Unchanged position of right thoracostomy tube. No visualized pneumothorax. Emphysematous changes again noted with chronic interstitial opacities bilaterally. No new confluent airspace disease. IMPRESSION: Unchanged right-sided thoracostomy tube  with no pneumothorax. Electronically Signed   By: Gilmer Mor D.O.   On: 03/24/2019 09:39   Dg Chest Port 1 View  Result Date: 03/21/2019 CLINICAL DATA:  Shortness of breath, follow-up chest tube EXAM: PORTABLE CHEST 1 VIEW COMPARISON:  03/20/2019 FINDINGS:  Cardiac shadow is stable. Right pigtail catheter is again noted. No definitive pneumothorax is seen. No focal infiltrate or sizable effusion is noted. Aortic calcifications are seen. IMPRESSION: No definitive pneumothorax.  Stable right chest tube is noted. Electronically Signed   By: Alcide Clever M.D.   On: 03/21/2019 08:34   Dg Chest Port 1 View  Result Date: 03/20/2019 CLINICAL DATA:  Increasing hypoxia. EXAM: PORTABLE CHEST 1 VIEW COMPARISON:  Plain film CT of earlier today. FINDINGS: 0907 hours. Right-sided pigtail catheter again projects over the right mid lung. No residual pneumothorax identified. Midline trachea. Normal heart size. Prominent pulmonary arteries. Tortuous thoracic aorta. No pleural fluid. Clear lungs. IMPRESSION: Right-sided pigtail catheter in place, without plain film evidence of residual pneumothorax. Clearing of right base airspace disease since radiographs of earlier today. Pulmonary artery enlargement suggests pulmonary arterial hypertension. Electronically Signed   By: Jeronimo Greaves M.D.   On: 03/20/2019 21:22   Dg Chest Portable 1 View  Result Date: 03/20/2019 CLINICAL DATA:  Shortness of breath EXAM: PORTABLE CHEST 1 VIEW COMPARISON:  October 04, 2018 FINDINGS: There is a large pneumothorax on the right without appreciable tension component. Lungs otherwise are clear. Heart is slightly enlarged with mild pulmonary venous hypertension. No adenopathy. There is aortic atherosclerosis. No bone lesions. IMPRESSION: Large pneumothorax on the right without appreciable tension component. No edema or consolidation. Mild cardiac enlargement. Aortic Atherosclerosis (ICD10-I70.0). Critical Value/emergent results were called by telephone at the time of interpretation on 03/20/2019 at 2:44 pm to Dr. Marianna Fuss , who verbally acknowledged these results. Electronically Signed   By: Bretta Bang III M.D.   On: 03/20/2019 14:44   Dg Chest Port 1v Same Day  Result Date:  03/20/2019 CLINICAL DATA:  Chest tube placement for pneumothorax. EXAM: PORTABLE CHEST 1 VIEW COMPARISON:  Radiograph earlier this day at 2:28 p.m. FINDINGS: Right pigtail catheter placement. Decreased right pneumothorax, possible small residual apical component. Minimal right lung base patchy opacity may be atelectasis are re-expansion pulmonary edema. Suspect underlying emphysema. Unchanged heart size and mediastinal contours. No other change from prior exam. IMPRESSION: Right pigtail catheter placement with decreased right pneumothorax, question small residual atypical component. Minimal right lung base opacity may be atelectasis or re-expansion pulmonary edema. Electronically Signed   By: Narda Rutherford M.D.   On: 03/20/2019 16:29      Subjective: Breathing stable. No new complaints.   Discharge Exam: Vitals:   04/18/19 2010 04/19/19 0559  BP: 116/79 118/86  Pulse: 89 94  Resp: 18 18  Temp: 98.1 F (36.7 C) 98.1 F (36.7 C)  SpO2: 95% 94%     General: Pt is alert, awake, not in acute distress Cardiovascular: RRR, S1/S2 +, no rubs, no gallops Respiratory: CTA bilaterally, no wheezing, no rhonchi Abdominal: Soft, NT, ND, bowel sounds + Extremities: no edema, no cyanosis    The results of significant diagnostics from this hospitalization (including imaging, microbiology, ancillary and laboratory) are listed below for reference.     Microbiology: Recent Results (from the past 240 hour(s))  SARS Coronavirus 2 [facility] order, Performed in [facility] hospital lab) Nasopharyngeal Nasopharyngeal Swab     Status: None   Collection Time: 04/09/19  8:42 AM   Specimen:  Nasopharyngeal Swab  Result Value Ref Range Status   SARS Coronavirus 2 NEGATIVE NEGATIVE Final    Comment: (NOTE) If result is NEGATIVE SARS-CoV-2 target nucleic acids are NOT DETECTED. The SARS-CoV-2 RNA is generally detectable in upper and lower  respiratory specimens during the acute phase of infection. The  lowest  concentration of SARS-CoV-2 viral copies this assay can detect is 250  copies / mL. A negative result does not preclude SARS-CoV-2 infection  and should not be used as the sole basis for treatment or other  patient management decisions.  A negative result may occur with  improper specimen collection / handling, submission of specimen other  than nasopharyngeal swab, presence of viral mutation(s) within the  areas targeted by this assay, and inadequate number of viral copies  (<250 copies / mL). A negative result must be combined with clinical  observations, patient history, and epidemiological information. If result is POSITIVE SARS-CoV-2 target nucleic acids are DETECTED. The SARS-CoV-2 RNA is generally detectable in upper and lower  respiratory specimens dur ing the acute phase of infection.  Positive  results are indicative of active infection with SARS-CoV-2.  Clinical  correlation with patient history and other diagnostic information is  necessary to determine patient infection status.  Positive results do  not rule out bacterial infection or co-infection with other viruses. If result is PRESUMPTIVE POSTIVE SARS-CoV-2 nucleic acids MAY BE PRESENT.   A presumptive positive result was obtained on the submitted specimen  and confirmed on repeat testing.  While 2019 novel coronavirus  (SARS-CoV-2) nucleic acids may be present in the submitted sample  additional confirmatory testing may be necessary for epidemiological  and / or clinical management purposes  to differentiate between  SARS-CoV-2 and other Sarbecovirus currently known to infect humans.  If clinically indicated additional testing with an alternate test  methodology [phone]) is advised. The SARS-CoV-2 RNA is generally  detectable in upper and lower respiratory sp ecimens during the acute  phase of infection. The expected result is Negative. Fact Sheet for Patients:   BoilerBrush.com.cy Fact Sheet for Healthcare Providers: https://pope.com/ This test is not yet approved or cleared by the [place] FDA and has been authorized for detection and/or diagnosis of SARS-CoV-2 by FDA under an Emergency Use Authorization (EUA).  This EUA will remain in effect (meaning this test can be used) for the duration of the COVID-19 declaration under Section 564(b)(1) of the Act, 21 U.S.C. section 360bbb-3(b)(1), unless the authorization is terminated or revoked sooner. Performed at [facility], [address], [place], [place] [address]   Surgical pcr screen     Status: None   Collection Time: 04/10/19 10:10 PM   Specimen: Nasal Mucosa; Nasal Swab  Result Value Ref Range Status   MRSA, PCR NEGATIVE NEGATIVE Final   Staphylococcus aureus NEGATIVE NEGATIVE Final    Comment: (NOTE) The Xpert SA Assay (FDA approved for NASAL specimens in patients 24 years of age and older), is one component of a comprehensive surveillance program. It is not intended to diagnose infection nor to guide or monitor treatment. Performed at [facility], [address], [place], [place] [address]      Labs: BNP (last 3 results) Recent Labs    10/04/18 1130 03/20/19 1458 04/14/19 0036  BNP 1,874.0* 75.0 81.5   Basic Metabolic Panel: Recent Labs  Lab 04/13/19 0419 04/14/19 0036 04/15/19 0400  NA 134* 135 135  K 4.6 4.4 4.3  CL 88* 88* 90*  CO2 39* 35*  40*  GLUCOSE 84 80 80  BUN 22 15 12   CREATININE 0.78 0.61 0.58*  CALCIUM 8.7* 8.8* 9.0  MG 1.6* 1.6* 1.7   Liver Function Tests: No results for input(s): AST, ALT, ALKPHOS, BILITOT, PROT, ALBUMIN in the last 168 hours. No results for input(s): LIPASE, AMYLASE in the last 168 hours. No results for input(s): AMMONIA in the last 168 hours. CBC: Recent Labs  Lab 04/13/19 0419 04/14/19 0036 04/15/19 0400  WBC 7.6 6.9 6.7  HGB 13.0 12.4* 12.6*  HCT 40.9 39.0 39.3   MCV 98.8 98.2 98.7  PLT 199 202 199   Cardiac Enzymes: No results for input(s): CKTOTAL, CKMB, CKMBINDEX, TROPONINI in the last 168 hours. BNP: Invalid input(s): POCBNP CBG: No results for input(s): GLUCAP in the last 168 hours. D-Dimer No results for input(s): DDIMER in the last 72 hours. Hgb A1c No results for input(s): HGBA1C in the last 72 hours. Lipid Profile No results for input(s): CHOL, HDL, LDLCALC, TRIG, CHOLHDL, LDLDIRECT in the last 72 hours. Thyroid function studies No results for input(s): TSH, T4TOTAL, T3FREE, THYROIDAB in the last 72 hours.  Invalid input(s): FREET3 Anemia work up No results for input(s): VITAMINB12, FOLATE, FERRITIN, TIBC, IRON, RETICCTPCT in the last 72 hours. Urinalysis No results found for: COLORURINE, APPEARANCEUR, LABSPEC, PHURINE, GLUCOSEU, HGBUR, BILIRUBINUR, KETONESUR, PROTEINUR, UROBILINOGEN, NITRITE, LEUKOCYTESUR Sepsis Labs Invalid input(s): PROCALCITONIN,  WBC,  LACTICIDVEN Microbiology Recent Results (from the past 240 hour(s))  SARS Coronavirus 2 [facility] order, Performed in [facility] hospital lab) Nasopharyngeal Nasopharyngeal Swab     Status: None   Collection Time: 04/09/19  8:42 AM   Specimen: Nasopharyngeal Swab  Result Value Ref Range Status   SARS Coronavirus 2 NEGATIVE NEGATIVE Final    Comment: (NOTE) If result is NEGATIVE SARS-CoV-2 target nucleic acids are NOT DETECTED. The SARS-CoV-2 RNA is generally detectable in upper and lower  respiratory specimens during the acute phase of infection. The lowest  concentration of SARS-CoV-2 viral copies this assay can detect is 250  copies / mL. A negative result does not preclude SARS-CoV-2 infection  and should not be used as the sole basis for treatment or other  patient management decisions.  A negative result may occur with  improper specimen collection / handling, submission of specimen other  than nasopharyngeal swab, presence of viral mutation(s) within the  areas  targeted by this assay, and inadequate number of viral copies  (<250 copies / mL). A negative result must be combined with clinical  observations, patient history, and epidemiological information. If result is POSITIVE SARS-CoV-2 target nucleic acids are DETECTED. The SARS-CoV-2 RNA is generally detectable in upper and lower  respiratory specimens dur ing the acute phase of infection.  Positive  results are indicative of active infection with SARS-CoV-2.  Clinical  correlation with patient history and other diagnostic information is  necessary to determine patient infection status.  Positive results do  not rule out bacterial infection or co-infection with other viruses. If result is PRESUMPTIVE POSTIVE SARS-CoV-2 nucleic acids MAY BE PRESENT.   A presumptive positive result was obtained on the submitted specimen  and confirmed on repeat testing.  While 2019 novel coronavirus  (SARS-CoV-2) nucleic acids may be present in the submitted sample  additional confirmatory testing may be necessary for epidemiological  and / or clinical management purposes  to differentiate between  SARS-CoV-2 and other Sarbecovirus currently known to infect humans.  If clinically indicated additional testing with an alternate test  methodology [phone]) is advised.  The SARS-CoV-2 RNA is generally  detectable in upper and lower respiratory sp ecimens during the acute  phase of infection. The expected result is Negative. Fact Sheet for Patients:  BoilerBrush.com.cy Fact Sheet for Healthcare Providers: https://pope.com/ This test is not yet approved or cleared by the [place] FDA and has been authorized for detection and/or diagnosis of SARS-CoV-2 by FDA under an Emergency Use Authorization (EUA).  This EUA will remain in effect (meaning this test can be used) for the duration of the COVID-19 declaration under Section 564(b)(1) of the Act, 21 U.S.C. section  360bbb-3(b)(1), unless the authorization is terminated or revoked sooner. Performed at [facility], [address] [place], [place] [address]   Surgical pcr screen     Status: None   Collection Time: 04/10/19 10:10 PM   Specimen: Nasal Mucosa; Nasal Swab  Result Value Ref Range Status   MRSA, PCR NEGATIVE NEGATIVE Final   Staphylococcus aureus NEGATIVE NEGATIVE Final    Comment: (NOTE) The Xpert SA Assay (FDA approved for NASAL specimens in patients 66 years of age and older), is one component of a comprehensive surveillance program. It is not intended to diagnose infection nor to guide or monitor treatment. Performed at [facility], [address] [place], [place] [address]      Time coordinating discharge: 40 minutes  SIGNED:   Alba Cory, MD  [facility]

## 2019-04-19 NOTE — Progress Notes (Signed)
Pt discharged home w/ sister Stanton Kidney. She did not bring O2 tanks w/ her so we requested Case Management to have some delivered. Pt and Stanton Kidney did not want to wait any longer for tanks so she had family bring some to the hospital, then the patient was discharged. Discharge instructions and prescriptions give, both verbalized understanding. All belongings returned to the pt.

## 2019-04-19 NOTE — Progress Notes (Signed)
Niece is here to pick up patient and would like for NCM to call [employer] to bring oxygen tanks for him to go home with, she did not bring the tanks with her.  NCM contacted Ashlin with [employer] and he will call his on call contact to deliver to the hospital.

## 2019-04-25 ENCOUNTER — Encounter: Payer: Self-pay | Admitting: Thoracic Surgery (Cardiothoracic Vascular Surgery)

## 2019-04-25 ENCOUNTER — Other Ambulatory Visit: Payer: Self-pay

## 2019-04-25 ENCOUNTER — Ambulatory Visit: Payer: Medicare HMO | Admitting: Thoracic Surgery (Cardiothoracic Vascular Surgery)

## 2019-04-25 VITALS — BP 114/78 | HR 96 | Temp 97.7°F | Resp 20 | Ht 69.5 in | Wt 112.0 lb

## 2019-04-25 DIAGNOSIS — J939 Pneumothorax, unspecified: Secondary | ICD-10-CM

## 2019-04-25 DIAGNOSIS — Z09 Encounter for follow-up examination after completed treatment for conditions other than malignant neoplasm: Secondary | ICD-10-CM

## 2019-04-25 DIAGNOSIS — IMO0002 Reserved for concepts with insufficient information to code with codable children: Secondary | ICD-10-CM

## 2019-04-25 DIAGNOSIS — J439 Emphysema, unspecified: Secondary | ICD-10-CM

## 2019-04-25 DIAGNOSIS — J9382 Other air leak: Secondary | ICD-10-CM

## 2019-04-25 NOTE — Progress Notes (Signed)
      [address]       [place] [address]             [phone]        Braylon W Ezekiel Washougal Medical Record #[id number] Date of Birth: Dec 28, 1950  Referring: Arnoldo Lenis, MD Primary Care: Lemmie Evens, MD Primary Cardiologist:Branch, Roderic Palau, MD  Reason for visit:   follow-up  History of Present Illness:     68 year old male presents for his first follow-up appointment after undergoing endobronchial valve placement and chemical pleurodesis for recurrent spontaneous pneumothorax.  No changes to his respiratory status.  Physical Exam: BP 114/78   Pulse 96   Temp 97.7 F (36.5 C) (Skin)   Resp 20   Ht 5' 9.5" (1.765 m)   Wt 112 lb (50.8 kg)   SpO2 93% Comment: 6L O2 per   BMI 16.30 kg/m   Alert NAD Chronically debilitated, in a wheelchair Increased work of breathing on nasal cannula.       Assessment / Plan:   68 year old male with history of severe bullous emphysema who has had a right-sided pneumothorax on 3 occasions.  Endobronchial valves were placed with his most recent episode.  I explained to him that normally these valves are removed at 6 weeks but given the severity of his bullous emphysema recommended that the valve stay in to prevent any further pneumothoraces in the future.  Follow-up as needed.   Lajuana Matte 04/25/2019 2:22 PM

## 2019-06-12 ENCOUNTER — Telehealth: Payer: Self-pay | Admitting: Cardiology

## 2019-06-12 NOTE — Telephone Encounter (Signed)

## 2019-06-16 ENCOUNTER — Ambulatory Visit: Payer: Medicare HMO | Admitting: Cardiology

## 2019-06-24 ENCOUNTER — Encounter: Payer: Self-pay | Admitting: Cardiology

## 2019-06-24 ENCOUNTER — Encounter: Payer: Self-pay | Admitting: *Deleted

## 2019-06-24 ENCOUNTER — Telehealth (INDEPENDENT_AMBULATORY_CARE_PROVIDER_SITE_OTHER): Payer: Medicare HMO | Admitting: Cardiology

## 2019-06-24 VITALS — BP 110/67 | Ht 67.5 in | Wt 118.0 lb

## 2019-06-24 DIAGNOSIS — I5032 Chronic diastolic (congestive) heart failure: Secondary | ICD-10-CM

## 2019-06-24 DIAGNOSIS — I272 Pulmonary hypertension, unspecified: Secondary | ICD-10-CM

## 2019-06-24 NOTE — Patient Instructions (Signed)
Your physician recommends that you schedule a follow-up appointment in: 4 MONTHS WITH DR BRANCH  Your physician recommends that you continue on your current medications as directed. Please refer to the Current Medication list given to you today.  Thank you for choosing [facility]!!    

## 2019-06-24 NOTE — Progress Notes (Signed)
Virtual Visit via Telephone Note   This visit type was conducted due to national recommendations for restrictions regarding the COVID-19 Pandemic (e.g. social distancing) in an effort to limit this patient's exposure and mitigate transmission in our community.  Due to his co-morbid illnesses, this patient is at least at moderate risk for complications without adequate follow up.  This format is felt to be most appropriate for this patient at this time.  The patient did not have access to video technology/had technical difficulties with video requiring transitioning to audio format only (telephone).  All issues noted in this document were discussed and addressed.  No physical exam could be performed with this format.  Please refer to the patient's chart for his  consent to telehealth for [facility].   Date:  06/24/2019   ID:  Derrick [name], DOB 04-21-1951, MRN [id number]  Patient Location: Home Provider Location: Home  PCP:  Lemmie Evens, MD  Cardiologist:  Carlyle Dolly, MD  Electrophysiologist:  None   Evaluation Performed:  Follow-Up Visit  Chief Complaint:  Follow up visit  History of Present Illness:    Derrick [name] is a 68 y.o. male seen for the following medical problems  1. Dyspnea - echo 06/2016 LVEF 45-50%, diffuse hypokinesis, cannot eval diastolic function. Moderate AI, mild MR. PASP 50 (not 150) - severe LVH. Septum 20 mm, posterior wall 18 mm - cardiac MRI 12/2016: moderate LV septal thickness 15 mm, posterior wall 9 mm. No SAM or LVOT gradient, gadolinium pattern not consistent with hypertrophic CM but consider amyloid. Moderate AI. Normal RV size and function. Normal atria. - UPEP/SPEP overall unremarkable.  - no recent LE edema. Chronic SOB unchanged.    2. Severe COPD - no recent cough or wheezign. Compliant with inhalers. Uses O2 at night  - breathing is stable. He is on home O2, 6L chronically    3.Chronic diastolic HF - during 0/6301  thought to be volume overloaded, Diursed, discharged on lasix 40mg  daily - 10/2018 echo LVEF 60-10%,XNATF I diastolic dysfunction,severe asymmetric LVH. RV pressure and volume overload, severe RV dysfunction. RVSP 56, mod TR, mild AS, mod AI  - no recent edema  4. Pulmonary hypertensionwith RV failure - echo 05/2017 with PASP 88. Could not evaluate diastolic function, normal LA size would argue against significant dysfunction - 09/2018 echo LVEF 60-65%, RV pressure and volume overload, severe RV dysfunction and enlargement, RVSP 56 mmHg, mod TR - he does have history of COPD, rheumatoid arthritis as well. +ANA previously - he has refused RHCrepeatedly    5. NSVT - wide complex rhythm during admission 07/2017 reported as NSVT, does not appear patient evaluated by cardiology at the time. Longest run reported at 30 beats. Thoughts due to use of beta agonists for his COPD exacberation. Started on metoprolol.  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    Past Medical History:  Diagnosis Date  . Allergic rhinitis due to pollen 09/08/2009  . CHF (congestive heart failure) ([place])   . COPD (chronic obstructive pulmonary disease) ([place]) 03/14/2013  . 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 Family history of diabetes mellitus 09/08/2009   father  . Family  history of stroke (cerebrovascular) 09/08/2009   father  . Nicotine dependence, chewing tobacco, uncomplicated 08/07/1968  . Other disturbances of skin sensation 11/08/2012  . Perennial allergic rhinitis with seasonal variation 11/08/2012  . Pneumothorax on right 04/2019   chest tube insertion  . RA (rheumatoid  arthritis) ([facility]) 09/08/2009   Past Surgical History:  Procedure Laterality Date  . CHEST TUBE INSERTION Right 04/09/2019  . DENTAL SURGERY    . VIDEO BRONCHOSCOPY WITH INSERTION OF INTERBRONCHIAL VALVE (IBV) N/A 04/11/2019   Procedure: VIDEO BRONCHOSCOPY WITH INSERTION OF INTERBRONCHIAL VALVE (IBV);  Surgeon: Corliss SkainsLightfoot, Harrell O, MD;  [facility];  Service: Thoracic;  Laterality: N/A;  Size 5, Size 7, Size 7     No outpatient medications have been marked as taking for the 06/24/19 encounter (Appointment) with Antoine PocheBranch, Mahamed Zalewski F, MD.     Allergies:   Patient has no known allergies.   Social History   Tobacco Use  . Smoking status: Former Smoker    Types: Cigarettes  . Smokeless tobacco: Current User    Types: Chew  . Tobacco comment: has chewed tobacco for 40+ years  Substance Use Topics  . Alcohol use: No  . Drug use: No     Family Hx: The patient's family history includes CVA in his father; Heart attack in his brother; Hypertension in his mother.  ROS:   Please see the history of present illness.     All other systems reviewed and are negative.   Prior CV studies:   The following studies were reviewed today:  12/2016 Cardiac MRI FINDINGS: Both atria were normal in size. RV was normal in size and function. There was no ASD/VSD or pericardial effusion. The aortic root was mildly dilatated at 3.9 cm The LV was normal in size The septum was 15 mm in thickness with the posterior wall being 9 mm. There was no SAM or LVOT gradient. There was moderate appearing AR. The RV was normal in size and function. The quantitative EF was 61% (EDV 120 cc ESV 46 cc SV 74 cc) Delayed enhancement images showed poor nulling of the myocardium suggesting possibility of amyloid or infiltrative DCM. There was no scar.  Note poor quality study due to motion and ectopy Most sequences done with free breathing  IMPRESSION: 1) Moderate LVH septal thickness 15 mm compared to posterior  wall 9 mm. No SAM or LVOT gradient. No delayed hyperenhancement to suggest HOCM  2) Failure to null myocardium post gadolinium suggesting possibility of amyloid  3) Tri-leaflet aortic valve with moderate appearing AR  4) Mild aortic root enlargement 3.9 cm  5) Normal RV size and function  6) No pericardial effusion  70 Normal LV size quantitative EF 61% no infarct or scar on delayed post gadolinium inversion recovery sequences  Charlton Hawseter Nishan  06/2017 echo Study Conclusions  - Left ventricle: Abnormal septal motion The cavity size was mildly dilated. Wall thickness was increased in a pattern of severe LVH. Systolic function was mildly reduced. The estimated ejection fraction was in the range of 45% to 50%. Diffuse hypokinesis. The study is not technically sufficient to allow evaluation of LV diastolic function. - Aortic valve: There was moderate regurgitation. Valve area (VTI): 3.13 cm^2. Valve area (Vmax): 2.91 cm^2. Valve area (Vmean): 2.94 cm^2. - Mitral valve: There was mild regurgitation. - Left atrium: The atrium was mildly dilated. - Right atrium: The atrium was mildly dilated. - Atrial septum: No defect or patent foramen ovale was identified. - Pulmonary arteries:  PA peak pressure: 62 mm Hg (S). - Impressions: Tech entered wrong estimate of CVP Estimated PA pressure only 62 mmHg.  Impressions:  - Tech entered wrong estimate of CVP Estimated PA pressure only 62 mmHg.    05/2017 echo Study Conclusions  - Left ventricle: The cavity size was normal. Wall thickness was increased in a pattern of severe LVH. Systolic function was normal. The estimated ejection fraction was in the range of 60% to 65%. Wall motion was normal; there were no regional wall motion abnormalities. Diastolic dysfunction, grade indeterminate. Indeterminate filling pressures. - Aortic valve: Trileaflet; normal thickness leaflets. There was  moderate regurgitation. - Aorta: Mild aortic root dilatation. Aortic root dimension: 42 mm (ED). - Mitral valve: There was mild regurgitation. - Right ventricle: The cavity size was moderately dilated. Systolic function was moderately reduced. - Right atrium: The atrium was moderately to severely dilated. - Atrial septum: No defect or patent foramen ovale was identified. - Tricuspid valve: There was mild-moderate regurgitation. - Pulmonary arteries: Systolic pressure was severely increased. PA peak pressure: 88 mm Hg (S).    09/2018 echo IMPRESSIONS   1. The left ventricle has normal systolic function with an ejection fraction of 60-65%. The cavity size was normal. There is severe asymmetric left ventricular hypertrophy. Left ventricular diastolic Doppler parameters are consistent with impaired  relaxation There is right ventricular volume and pressure overload. 2. The right ventricle has severely reduced systolic function. The cavity was severely enlarged. There is mildly increased right ventricular wall thickness. Right ventricular systolic pressure is moderately elevated with an estimated pressure of 55.8  mmHg. 3. Right atrial size was severely dilated. 4. Small pericardial effusion. 5. The pericardial effusion is circumferential. 6. The mitral valve is normal in structure. 7. The tricuspid valve is normal in structure. Tricuspid valve regurgitation is moderate. 8. The aortic valve is tricuspid Mild thickening of the aortic valve Aortic valve regurgitation is moderate by color flow Doppler. mild stenosis of the aortic valve. 9. There is mild dilatation of the aortic root. 10. Pulmonary hypertension is moderate. 11. The inferior vena cava was dilated in size with <50% respiratory variability.  Labs/Other Tests and Data Reviewed:    EKG:  No ECG reviewed.  Recent Labs: 03/27/2019: ALT 13 04/14/2019: B Natriuretic Peptide 81.5 04/15/2019: BUN 12; Creatinine,  Ser 0.58; Hemoglobin 12.6; Magnesium 1.7; Platelets 199; Potassium 4.3; Sodium 135   Recent Lipid Panel No results found for: CHOL, TRIG, HDL, CHOLHDL, LDLCALC, LDLDIRECT  Wt Readings from Last 3 Encounters:  04/25/19 112 lb (50.8 kg)  04/19/19 112 lb 9.6 oz (51.1 kg)  03/25/19 110 lb 3.7 oz (50 kg)     Objective:    Vital Signs:   Today's Vitals   06/24/19 1048  BP: 110/67  Weight: 118 lb (53.5 kg)  Height: 5' 7.5" (1.715 m)   Body mass index is 18.21 kg/m. Normal affect. Normal speech pattern and tone. Comfortable, no apparent distress. No audible signs of SOB or wheezing.   ASSESSMENT & PLAN:    1. LVH/Chronic diastolic HF - moderate asymmetric hypertrophy, MRI findings not consistent with hypertrophic CM. Pattern questionable for amyloid, SPEPand UPEP overall unremarkable. - LVEF has since normalized  - no recent edema. SOB/DOE more related to his chronic severe COPD, he is on home O2 6L at baseline.   2. Pulmonary HTNwith RV failure -severe by echo. History of COPD and rheumatoid arthtitis - I have strongly recommended RHC for patient,he has continously refused  - continue to  monitor at this time    COVID-19 Education: The signs and symptoms of COVID-19 were discussed with the patient and how to seek care for testing (follow up with PCP or arrange E-visit).  The importance of social distancing was discussed today.  Time:   Today, I have spent 14 minutes with the patient with telehealth technology discussing the above problems.     Medication Adjustments/Labs and Tests Ordered: Current medicines are reviewed at length with the patient today.  Concerns regarding medicines are outlined above.   Tests Ordered: No orders of the defined types were placed in this encounter.   Medication Changes: No orders of the defined types were placed in this encounter.   Follow Up:  In Person in 6 month(s)  Signed, Dina Rich, MD  06/24/2019 8:22 AM    [facility]

## 2019-08-14 ENCOUNTER — Emergency Department (HOSPITAL_COMMUNITY): Payer: Medicare HMO

## 2019-08-14 ENCOUNTER — Emergency Department (HOSPITAL_COMMUNITY)
Admission: EM | Admit: 2019-08-14 | Discharge: 2019-08-14 | Disposition: A | Discharge status: Home / Self Care | Payer: Medicare HMO | Attending: Emergency Medicine | Admitting: Emergency Medicine

## 2019-08-14 ENCOUNTER — Other Ambulatory Visit: Payer: Self-pay

## 2019-08-14 ENCOUNTER — Encounter (HOSPITAL_COMMUNITY): Payer: Self-pay

## 2019-08-14 DIAGNOSIS — I5032 Chronic diastolic (congestive) heart failure: Secondary | ICD-10-CM | POA: Insufficient documentation

## 2019-08-14 DIAGNOSIS — F17220 Nicotine dependence, chewing tobacco, uncomplicated: Secondary | ICD-10-CM | POA: Insufficient documentation

## 2019-08-14 DIAGNOSIS — J441 Chronic obstructive pulmonary disease with (acute) exacerbation: Secondary | ICD-10-CM

## 2019-08-14 DIAGNOSIS — Z79899 Other long term (current) drug therapy: Secondary | ICD-10-CM | POA: Insufficient documentation

## 2019-08-14 DIAGNOSIS — Z20822 Contact with and (suspected) exposure to covid-19: Secondary | ICD-10-CM | POA: Insufficient documentation

## 2019-08-14 DIAGNOSIS — R0602 Shortness of breath: Secondary | ICD-10-CM | POA: Diagnosis present

## 2019-08-14 LAB — BASIC METABOLIC PANEL
Anion gap: 11 (ref 5–15)
BUN: 18 mg/dL (ref 8–23)
CO2: 42 mmol/L — ABNORMAL HIGH (ref 22–32)
Calcium: 9.3 mg/dL (ref 8.9–10.3)
Chloride: 86 mmol/L — ABNORMAL LOW (ref 98–111)
Creatinine, Ser: 0.75 mg/dL (ref 0.61–1.24)
GFR calc Af Amer: >60 mL/min (ref >60)
GFR calc non Af Amer: >60 mL/min (ref >60)
Glucose, Bld: 207 mg/dL — ABNORMAL HIGH (ref 70–99)
Potassium: 4.2 mmol/L (ref 3.5–5.1)
Sodium: 139 mmol/L (ref 135–145)

## 2019-08-14 LAB — CBC WITH DIFFERENTIAL/PLATELET
Abs Immature Granulocytes: 0.01 10*3/uL (ref 0.00–0.07)
Basophils Absolute: 0 10*3/uL (ref 0.0–0.1)
Basophils Relative: 1 %
Eosinophils Absolute: 0.1 10*3/uL (ref 0.0–0.5)
Eosinophils Relative: 2 %
HCT: 41.2 % (ref 39.0–52.0)
Hemoglobin: 12.4 g/dL — ABNORMAL LOW (ref 13.0–17.0)
Immature Granulocytes: 0 %
Lymphocytes Relative: 13 %
Lymphs Abs: 0.6 10*3/uL — ABNORMAL LOW (ref 0.7–4.0)
MCH: 31.7 pg (ref 26.0–34.0)
MCHC: 30.1 g/dL (ref 30.0–36.0)
MCV: 105.4 fL — ABNORMAL HIGH (ref 80.0–100.0)
Monocytes Absolute: 0.3 10*3/uL (ref 0.1–1.0)
Monocytes Relative: 6 %
Neutro Abs: 3.3 10*3/uL (ref 1.7–7.7)
Neutrophils Relative %: 78 %
Platelets: 129 10*3/uL — ABNORMAL LOW (ref 150–400)
RBC: 3.91 MIL/uL — ABNORMAL LOW (ref 4.22–5.81)
RDW: 11.7 % (ref 11.5–15.5)
WBC: 4.3 10*3/uL (ref 4.0–10.5)
nRBC: 0 % (ref 0.0–0.2)

## 2019-08-14 LAB — TROPONIN I (HIGH SENSITIVITY)
Troponin I (High Sensitivity): 10 ng/L (ref <18)
Troponin I (High Sensitivity): 11 ng/L (ref <18)

## 2019-08-14 LAB — PROTIME-INR
INR: 0.9 (ref 0.8–1.2)
Prothrombin Time: 12.4 seconds (ref 11.4–15.2)

## 2019-08-14 LAB — D-DIMER, QUANTITATIVE: D-Dimer, Quant: 0.59 ug/mL-FEU — ABNORMAL HIGH (ref 0.00–0.50)

## 2019-08-14 LAB — SARS CORONAVIRUS 2 (TAT 6-24 HRS): SARS Coronavirus 2: NEGATIVE

## 2019-08-14 LAB — POC SARS CORONAVIRUS 2 AG -  ED: SARS Coronavirus 2 Ag: NEGATIVE

## 2019-08-14 LAB — BRAIN NATRIURETIC PEPTIDE: B Natriuretic Peptide: 46 pg/mL (ref 0.0–100.0)

## 2019-08-14 MED ORDER — METHYLPREDNISOLONE SODIUM SUCC 125 MG IJ SOLR
125.0000 mg | Freq: Once | INTRAMUSCULAR | Status: AC
  Administered 2019-08-14: 125 mg via INTRAVENOUS
  Filled 2019-08-14: qty 2

## 2019-08-14 MED ORDER — IOHEXOL 350 MG/ML SOLN
75.0000 mL | Freq: Once | INTRAVENOUS | Status: AC | PRN
  Administered 2019-08-14: 14:00:00 75 mL via INTRAVENOUS

## 2019-08-14 MED ORDER — PREDNISONE 20 MG PO TABS: 60.0000 mg | ORAL_TABLET | Freq: Every day | ORAL | 0 refills | Status: DC

## 2019-08-14 NOTE — Discharge Instructions (Addendum)
You were seen in the emergency department for increased shortness of breath.  You had blood work chest x-ray CAT scan of your chest EKG and Covid testing.  There were no serious findings identified.  Your Covid testing was negative.  We are treating you with 4 more days of prednisone.  Please continue to use your inhaler as needed.  Schedule a follow-up appointment with your primary care doctor and return to the emergency department if any worsening breathing or other concerning symptoms

## 2019-08-14 NOTE — ED Notes (Signed)
Patient ambulated around room. Patient became short of breath and winded after 3 laps around room. Patient needed to sit down and rest. Patient maintained O2 of 97% on 6L O2 via [place].  Patient's HR increased to 110. RR 25

## 2019-08-14 NOTE — ED Provider Notes (Signed)
[facility] EMERGENCY DEPARTMENT Provider Note   CSN: [id number] Arrival date & time: 08/14/19  1941     History Chief Complaint  Patient presents with  . Shortness of Breath    Derrick [name] is a 69 y.o. male.  He has a history of COPD and CHF and a prior history of pneumothorax.  He said he has had short of breath on and off for months but been worse since yesterday.  He also said his blood pressure was low this morning.  He is on 6 L nasal cannula 24/7.  Chronic cough productive of some white sputum.  Felt hot although has not documented a fever.  No chest pain abdominal pain nausea vomiting diarrhea.  The history is provided by the patient.  Shortness of Breath Severity:  Moderate Onset quality:  Gradual Duration:  2 days Timing:  Constant Progression:  Worsening Chronicity:  Recurrent Relieved by:  Nothing Worsened by:  Activity Ineffective treatments:  Oxygen Associated symptoms: cough and sputum production   Associated symptoms: no abdominal pain, no chest pain, no fever, no headaches, no hemoptysis, no neck pain, no rash, no sore throat and no vomiting        Past Medical History:  Diagnosis Date  . Allergic rhinitis due to pollen 09/08/2009  . CHF (congestive heart failure) (HCC)   . COPD (chronic obstructive pulmonary disease) (HCC) 03/14/2013  . 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 Family history of diabetes mellitus 09/08/2009   father  . Family history of stroke (cerebrovascular) 09/08/2009   father  . Nicotine dependence, chewing tobacco, uncomplicated 08/07/1968  . Other disturbances of skin sensation 11/08/2012  . Perennial allergic rhinitis with seasonal variation 11/08/2012  . Pneumothorax on right 04/2019   chest tube insertion  . RA (rheumatoid arthritis) (HCC) 09/08/2009    Patient Active Problem List   Diagnosis Date Noted  . Pneumothorax on right 04/09/2019  . CHF (congestive heart failure) (HCC)   .  Palliative care by specialist   . DNR (do not resuscitate) discussion   . Weakness generalized   . Protein-calorie malnutrition, severe 03/22/2019  . Chest tube in place   . Status post chest tube placement (HCC)   . Spontaneous pneumothorax 03/20/2019  . Acute respiratory failure with hypoxia (HCC) 10/04/2018  . Cellulitis 10/04/2018  . Acute on chronic diastolic CHF (congestive heart failure) (HCC) 10/04/2018  . Pulmonary HTN (HCC) 07/08/2017  . COPD exacerbation (HCC) 07/06/2017  . COPD (chronic obstructive pulmonary disease) (HCC) 03/14/2013  . RA (rheumatoid arthritis) (HCC) 09/08/2009  . Nicotine dependence, chewing tobacco, uncomplicated 08/07/1968    Past Surgical History:  Procedure Laterality Date  . CHEST TUBE INSERTION Right 04/09/2019  . DENTAL SURGERY    . VIDEO BRONCHOSCOPY WITH INSERTION OF INTERBRONCHIAL VALVE (IBV) N/A 04/11/2019   Procedure: VIDEO BRONCHOSCOPY WITH INSERTION OF INTERBRONCHIAL VALVE (IBV);  Surgeon: Corliss Skains, MD;  Location: [place] [facility];  Service: Thoracic;  Laterality: N/A;  Size 5, Size 7, Size 7       Family History  Problem Relation Age of Onset  . Hypertension Mother   . CVA Father   . Heart attack Brother     Social History   Tobacco Use  . Smoking status: Former Smoker    Types: Cigarettes  . Smokeless tobacco: Current User    Types: Chew  . Tobacco comment: has chewed tobacco for 40+ years  Substance Use Topics  . Alcohol use: No  .  Drug use: No    Home Medications Prior to Admission medications   Medication Sig Start Date End Date Taking? Authorizing Provider  albuterol (PROVENTIL HFA;VENTOLIN HFA) 108 (90 Base) MCG/ACT inhaler Inhale 1-2 puffs into the lungs every 6 (six) hours as needed for wheezing or shortness of breath. 10/10/18   Shon Hale, MD  albuterol (PROVENTIL) (2.5 MG/3ML) 0.083% nebulizer solution Take 3 mLs (2.5 mg total) by nebulization every 4 (four) hours as needed for wheezing or shortness of  breath. 10/10/18   Shon Hale, MD  carvedilol (COREG) 3.125 MG tablet Take 1 tablet (3.125 mg total) by mouth 2 (two) times daily. 10/10/18 06/24/19  Shon Hale, MD  [employer] VITAMIN B12 1000 MCG tablet Take 1,000 mcg by mouth daily.  05/29/16   [provider]  feeding supplement, ENSURE ENLIVE, (ENSURE ENLIVE) LIQD Take 237 mLs by mouth 3 (three) times daily between meals. 04/19/19   Regalado, Belkys A, MD  fluticasone (FLONASE) 50 MCG/ACT nasal spray Place 1 spray into both nostrils daily. Patient taking differently: Place 2 sprays into both nostrils daily.  10/10/18   Shon Hale, MD  furosemide (LASIX) 40 MG tablet 1 (40 mg) Tab po qam and half- Tab (20 mg) qpm Patient taking differently: Take 40 mg by mouth daily.  10/10/18   Shon Hale, MD  ipratropium (ATROVENT HFA) 17 MCG/ACT inhaler Inhale 1 puff into the lungs 3 (three) times daily. 04/19/19 04/18/20  Regalado, Belkys A, MD  lisinopril (PRINIVIL,ZESTRIL) 2.5 MG tablet Take 1 tablet (2.5 mg total) by mouth daily. 10/10/18 06/24/19  Shon Hale, MD  Multiple Vitamin (MULTIVITAMIN WITH MINERALS) TABS tablet Take 1 tablet by mouth daily. 04/19/19   Regalado, Belkys A, MD  potassium chloride SA (KLOR-CON M20) 20 MEQ tablet Take 1 tablet (20 mEq total) by mouth daily. 10/10/18   Shon Hale, MD    Allergies    Patient has no known allergies.  Review of Systems   Review of Systems  Constitutional: Negative for fever.  HENT: Negative for sore throat.   Eyes: Negative for visual disturbance.  Respiratory: Positive for cough, sputum production and shortness of breath. Negative for hemoptysis.   Cardiovascular: Negative for chest pain.  Gastrointestinal: Negative for abdominal pain and vomiting.  Genitourinary: Negative for dysuria.  Musculoskeletal: Negative for neck pain.  Skin: Negative for rash.  Neurological: Negative for headaches.    Physical Exam Updated Vital Signs BP (!) 149/35 (BP Location: Right Arm)    Pulse 86   Resp 18   Ht 5\' 11"  (1.803 m)   Wt 53.5 kg   SpO2 100%   BMI 16.46 kg/m   Physical Exam Vitals and nursing note reviewed.  Constitutional:      Appearance: He is well-developed.  HENT:     Head: Normocephalic and atraumatic.  Eyes:     Conjunctiva/sclera: Conjunctivae normal.  Cardiovascular:     Rate and Rhythm: Normal rate and regular rhythm.     Heart sounds: No murmur.  Pulmonary:     Effort: Pulmonary effort is normal. No respiratory distress.     Breath sounds: Normal breath sounds.  Abdominal:     Palpations: Abdomen is soft.     Tenderness: There is no abdominal tenderness.  Musculoskeletal:        General: Normal range of motion.     Cervical back: Neck supple.     Right lower leg: No tenderness. No edema.     Left lower leg: No tenderness. No edema.  Skin:    General: Skin is warm and dry.     Capillary Refill: Capillary refill takes less than 2 seconds.  Neurological:     General: No focal deficit present.     Mental Status: He is alert.     ED Results / Procedures / Treatments   Labs (all labs ordered are listed, but only abnormal results are displayed) Labs Reviewed  BASIC METABOLIC PANEL - Abnormal; Notable for the following components:      Result Value   Chloride 86 (*)    CO2 42 (*)    Glucose, Bld 207 (*)    All other components within normal limits  CBC WITH DIFFERENTIAL/PLATELET - Abnormal; Notable for the following components:   RBC 3.91 (*)    Hemoglobin 12.4 (*)    MCV 105.4 (*)    Platelets 129 (*)    Lymphs Abs 0.6 (*)    All other components within normal limits  D-DIMER, QUANTITATIVE (NOT AT [facility]) - Abnormal; Notable for the following components:   D-Dimer, Quant 0.59 (*)    All other components within normal limits  SARS CORONAVIRUS 2 (TAT 6-24 HRS)  BRAIN NATRIURETIC PEPTIDE  PROTIME-INR  POC SARS CORONAVIRUS 2 AG -  ED  TROPONIN I (HIGH SENSITIVITY)  TROPONIN I (HIGH SENSITIVITY)    EKG EKG  Interpretation  Date/Time:  Thursday August 14 2019 10:07:04 EST Ventricular Rate:  84 PR Interval:    QRS Duration: 95 QT Interval:  375 QTC Calculation: 444 R Axis:   76 Text Interpretation: sinus Probable left ventricular hypertrophy ST elevation suggests acute pericarditis Confirmed by Aletta Edouard [id number]) on 08/14/2019 10:13:12 AM   Radiology CT Angio Chest PE W/Cm &/Or Wo Cm  Result Date: 08/14/2019 CLINICAL DATA:  Worsening short of breath. EXAM: CT ANGIOGRAPHY CHEST WITH CONTRAST TECHNIQUE: Multidetector CT imaging of the chest was performed using the standard protocol during bolus administration of intravenous contrast. Multiplanar CT image reconstructions and MIPs were obtained to evaluate the vascular anatomy. CONTRAST:  33mL OMNIPAQUE IOHEXOL 350 MG/ML SOLN COMPARISON:  CT chest 03/20/2019 FINDINGS: Cardiovascular: Negative for pulmonary embolism. Moderate to severe pulmonary artery enlargement. Main pulmonary artery 39 mm in diameter, unchanged. Right and left pulmonary arteries diffusely enlarged. Atherosclerotic aorta. Ascending aorta measures 4.2 cm. Descending thoracic aorta measures 3.2 cm. Stable from prior. No dissection. Mediastinum/Nodes: Negative for mass or adenopathy. Lungs/Pleura: Severe bullous emphysema mostly in the apices. No pneumothorax. Soft tissue thickening along the right upper lobe bulla which is unchanged. There is also soft tissue thickening along left upper lobe bulla anteriorly which is stable. Probable scarring. No acute infiltrate or effusion. Upper Abdomen: Negative Musculoskeletal: No acute abnormality. Review of the MIP images confirms the above findings. IMPRESSION: Negative for pulmonary embolism. Pulmonary artery enlargement due to pulmonary artery hypertension, stable Severe bullous emphysema unchanged from prior study. No acute infiltrate Aortic Atherosclerosis (ICD10-I70.0) and Emphysema (ICD10-J43.9). Electronically Signed   By: Franchot Gallo M.D.    On: 08/14/2019 14:03   DG Chest Portable 1 View  Result Date: 08/14/2019 CLINICAL DATA:  Shortness of breath, cough EXAM: PORTABLE CHEST 1 VIEW COMPARISON:  04/19/2019 FINDINGS: There is hyperinflation of the lungs compatible with COPD. Bullous changes in the upper lobes. Heart is normal size. No confluent opacities or effusions. No acute bony abnormality. IMPRESSION: Bullous emphysematous changes.  No acute cardiopulmonary disease. Electronically Signed   By: Rolm Baptise M.D.   On: 08/14/2019 11:01    Procedures Procedures (including critical care  time)  Medications Ordered in ED Medications  iohexol (OMNIPAQUE) 350 MG/ML injection 75 mL (75 mLs Intravenous Contrast Given 08/14/19 1342)  methylPREDNISolone sodium succinate (SOLU-MEDROL) 125 mg/2 mL injection 125 mg (125 mg Intravenous Given 08/14/19 1500)    ED Course  I have reviewed the triage vital signs and the nursing notes.  Pertinent labs & imaging results that were available during my care of the patient were reviewed by me and considered in my medical decision making (see chart for details).  Clinical Course as of Aug 13 1721  Thu Aug 14, 2019  1863 69 year old male known COPD oxygen dependent along with history of CHF pneumothorax here with increased shortness of breath over the course of a few days.  Satting 100% on 6 L not tachypneic no distress.  Differential includes COPD exacerbation, pneumonia, Covid, CHF, recurrent pneumothorax.   [MB]  1105 Chest x-ray interpreted by me as COPD with emphysematous changes in the apices right worse than left.   [MB]  1419 CT showing no PE.  I did comment upon his severe bullous disease.   [MB]  1514 Patient work-up has been pretty unremarkable here.  CT showing significant COPD but no PE or infiltrate.   [MB]  1514 Reviewed all of patient's work-up with him and he is comfortable going home.  We will put him on a few days of prednisone and he has inhalers at home.  We tried a trending  pulse ox in the room and he did get a little tachypneic and tachycardic but his pulse ox remained in the high 90s.   [MB]    Clinical Course User Index [MB] Terrilee Files, MD   MDM Rules/Calculators/A&P                     Derrick [name] was evaluated in Emergency Department on 08/14/2019 for the symptoms described in the history of present illness. He was evaluated in the context of the global COVID-19 pandemic, which necessitated consideration that the patient might be at risk for infection with the SARS-CoV-2 virus that causes COVID-19. Institutional protocols and algorithms that pertain to the evaluation of patients at risk for COVID-19 are in a state of rapid change based on information released by regulatory bodies including the CDC and federal and state organizations. These policies and algorithms were followed during the patient's care in the ED.  Final Clinical Impression(s) / ED Diagnoses Final diagnoses:  COPD exacerbation (HCC)    Rx / DC Orders ED Discharge Orders         Ordered    predniSONE (DELTASONE) 20 MG tablet  Daily     08/14/19 1515           Terrilee Files, MD 08/14/19 1723

## 2019-08-14 NOTE — ED Triage Notes (Signed)
Pt reports worsening sob since last night.  Also reports bp lower than usual.  Pt says bp was 90 systolic this morning.  Pt on home o2 at 6 liters.

## 2019-10-23 ENCOUNTER — Ambulatory Visit (INDEPENDENT_AMBULATORY_CARE_PROVIDER_SITE_OTHER): Payer: Medicare HMO | Admitting: Cardiology

## 2019-10-23 ENCOUNTER — Encounter: Payer: Self-pay | Admitting: Cardiology

## 2019-10-23 VITALS — BP 128/72 | HR 86 | Temp 98.1°F | Ht 71.5 in | Wt 121.0 lb

## 2019-10-23 DIAGNOSIS — I272 Pulmonary hypertension, unspecified: Secondary | ICD-10-CM | POA: Diagnosis not present

## 2019-10-23 DIAGNOSIS — I5032 Chronic diastolic (congestive) heart failure: Secondary | ICD-10-CM | POA: Diagnosis not present

## 2019-10-23 NOTE — Progress Notes (Signed)
Clinical Summary Mr. Maneri is a 69 y.o.male seen for the following medical problems  1. Dyspnea - echo 06/2016 LVEF 45-50%, diffuse hypokinesis, cannot eval diastolic function. Moderate AI, mild MR. PASP 50 (not 150) - severe LVH. Septum 20 mm, posterior wall 18 mm - cardiac MRI 12/2016: moderate LV septal thickness 15 mm, posterior wall 9 mm. No SAM or LVOT gradient, gadolinium pattern not consistent with hypertrophic CM but consider amyloid. Moderate AI. Normal RV size and function. Normal atria. - UPEP/SPEP overall unremarkable.    - chronic SOB unchanged. - on home O2 6L chronically - no recent edema.    2. Severe COPD - no recent cough or wheezign. Compliant with inhalers. Uses O2 at night  - breathing is stable. He is on home O2, 6L chronically - discussed seeing pulmonary, he wishes to wait until new physician establishes in this area    3.Chronic diastolic HF - during 08/6107 thought to be volume overloaded, Diursed, discharged on lasix 40mg  daily - 10/2018 echo LVEF 60-45%,WUJWJ I diastolic dysfunction,severe asymmetric LVH. RV pressure and volume overload, severe RV dysfunction. RVSP 56, mod TR, mild AS, mod AI  - no recent edema - compliant with meds. Taking lasix 40mg  once daily.   4. Pulmonary hypertensionwith RV failure - echo 05/2017 with PASP 88. Could not evaluate diastolic function, normal LA size would argue against significant dysfunction - 09/2018 echo LVEF 60-65%, RV pressure and volume overload, severe RV dysfunction and enlargement, RVSP 56 mmHg, mod TR - he does have history of COPD, rheumatoid arthritis as well. +ANA previously - he has refused RHCrepeatedly    5. NSVT - wide complex rhythm during admission 07/2017 reported as NSVT, does not appear patient evaluated by cardiology at the time. Longest run reported at 30 beats. Thoughts due to use of beta agonists for his COPD exacberation. Started on metoprolol.  - no  recent palpitatons.     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 Allergic rhinitis due to pollen 09/08/2009  . CHF (congestive heart failure) ([place])   . COPD (chronic obstructive pulmonary disease) ([place]) 03/14/2013  . 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 Family history of diabetes mellitus 09/08/2009   father  . Family history of stroke (cerebrovascular) 09/08/2009   father  . Nicotine dependence, chewing tobacco, uncomplicated 19/14/7829  . Other disturbances of skin sensation 11/08/2012  . Perennial allergic rhinitis with seasonal variation 11/08/2012  . Pneumothorax on right 04/2019   chest tube insertion  . RA (rheumatoid arthritis) ([place]) 09/08/2009     No Known Allergies   Current Outpatient Medications  Medication Sig Dispense Refill  . albuterol (PROVENTIL HFA;VENTOLIN HFA) 108 (90 Base) MCG/ACT inhaler Inhale 1-2 puffs into the lungs every 6 (six) hours as needed for wheezing or shortness of breath. (Patient taking differently: Inhale 2 puffs into the lungs every 4 (four) hours as needed for wheezing or shortness of breath. ) 1 Inhaler 3  . albuterol (PROVENTIL) (2.5 MG/3ML) 0.083% nebulizer solution Take 3 mLs (2.5 mg total) by nebulization every 4 (four) hours as needed for wheezing or shortness of breath. 75 mL 11  . carvedilol (COREG) 3.125 MG tablet Take 1 tablet (3.125 mg total) by mouth 2 (two) times daily. 180 tablet 3  . CVS VITAMIN  B12 1000 MCG tablet Take 1,000 mcg by mouth daily.   11  . feeding supplement, ENSURE ENLIVE, (ENSURE ENLIVE) LIQD Take 237 mLs by mouth 3 (three) times daily between meals. 237 mL 12  . fluticasone (FLONASE) 50 MCG/ACT nasal spray Place 1 spray into both nostrils daily. (Patient taking differently: Place 2 sprays into both  nostrils daily. ) 16 g 11  . furosemide (LASIX) 40 MG tablet 1 (40 mg) Tab po qam and half- Tab (20 mg) qpm (Patient taking differently: Take 40 mg by mouth daily. ) 135 tablet 1  . ipratropium (ATROVENT HFA) 17 MCG/ACT inhaler Inhale 1 puff into the lungs 3 (three) times daily. 1 Inhaler 2  . lisinopril (PRINIVIL,ZESTRIL) 2.5 MG tablet Take 1 tablet (2.5 mg total) by mouth daily. 90 tablet 3  . Multiple Vitamin (MULTIVITAMIN WITH MINERALS) TABS tablet Take 1 tablet by mouth daily. 30 tablet 0  . potassium chloride SA (KLOR-CON M20) 20 MEQ tablet Take 1 tablet (20 mEq total) by mouth daily. 90 tablet 3  . predniSONE (DELTASONE) 20 MG tablet Take 3 tablets (60 mg total) by mouth daily. 12 tablet 0   No current facility-administered medications for this visit.     Past Surgical History:  Procedure Laterality Date  . CHEST TUBE INSERTION Right 04/09/2019  . DENTAL SURGERY    . VIDEO BRONCHOSCOPY WITH INSERTION OF INTERBRONCHIAL VALVE (IBV) N/A 04/11/2019   Procedure: VIDEO BRONCHOSCOPY WITH INSERTION OF INTERBRONCHIAL VALVE (IBV);  Surgeon: Corliss Skains, MD;  Location[facility];  Service: Thoracic;  Laterality: N/A;  Size 5, Size 7, Size 7     No Known Allergies    Family History  Problem Relation Age of Onset  . Hypertension Mother   . CVA Father   . Heart attack Brother      Social History Mr. Rossa reports that he has quit smoking. His smoking use included cigarettes. His smokeless tobacco use includes chew. Mr. Arnold reports no history of alcohol use.   Review of Systems CONSTITUTIONAL: No weight loss, fever, chills, weakness or fatigue.  HEENT: Eyes: No visual loss, blurred vision, double vision or yellow sclerae.No hearing loss, sneezing, congestion, runny nose or sore throat.  SKIN: No rash or itching.  CARDIOVASCULAR: per hpi RESPIRATORY: per hpi GASTROINTESTINAL: No anorexia, nausea, vomiting or diarrhea. No abdominal pain or blood.  GENITOURINARY: No burning on  urination, no polyuria NEUROLOGICAL: No headache, dizziness, syncope, paralysis, ataxia, numbness or tingling in the extremities. No change in bowel or bladder control.  MUSCULOSKELETAL: No muscle, back pain, joint pain or stiffness.  LYMPHATICS: No enlarged nodes. No history of splenectomy.  PSYCHIATRIC: No history of depression or anxiety.  ENDOCRINOLOGIC: No reports of sweating, cold or heat intolerance. No polyuria or polydipsia.  Marland Kitchen   Physical Examination Today's Vitals   10/23/19 0908  BP: 128/72  Pulse: 86  Temp: 98.1 F (36.7 C)  SpO2: 92%  Weight: 121 lb (54.9 kg)  Height: 5' 11.5" (1.816 m)   Body mass index is 16.64 kg/m.  Gen: resting comfortably, no acute distress HEENT: no scleral icterus, pupils equal round and reactive, no palptable cervical adenopathy,  CV: RRR, no m/r/g, no jvd Resp: Clear to auscultation bilaterally GI: abdomen is soft, non-tender, non-distended, normal bowel sounds, no hepatosplenomegaly MSK: extremities are warm, no edema.  Skin: warm, no rash Neuro:  no focal deficits Psych: appropriate affect   Diagnostic Studies  12/2016 Cardiac MRI FINDINGS: Both atria were normal in size. RV was  normal in size and function. There was no ASD/VSD or pericardial effusion. The aortic root was mildly dilatated at 3.9 cm The LV was normal in size The septum was 15 mm in thickness with the posterior wall being 9 mm. There was no SAM or LVOT gradient. There was moderate appearing AR. The RV was normal in size and function. The quantitative EF was 61% (EDV 120 cc ESV 46 cc SV 74 cc) Delayed enhancement images showed poor nulling of the myocardium suggesting possibility of amyloid or infiltrative DCM. There was no scar.  Note poor quality study due to motion and ectopy Most sequences done with free breathing  IMPRESSION: 1) Moderate LVH septal thickness 15 mm compared to posterior wall 9 mm. No SAM or LVOT gradient. No delayed hyperenhancement to  suggest HOCM  2) Failure to null myocardium post gadolinium suggesting possibility of amyloid  3) Tri-leaflet aortic valve with moderate appearing AR  4) Mild aortic root enlargement 3.9 cm  5) Normal RV size and function  6) No pericardial effusion  70 Normal LV size quantitative EF 61% no infarct or scar on delayed post gadolinium inversion recovery sequences  [name]  06/2017 echo Study Conclusions  - Left ventricle: Abnormal septal motion The cavity size was mildly dilated. Wall thickness was increased in a pattern of severe LVH. Systolic function was mildly reduced. The estimated ejection fraction was in the range of 45% to 50%. Diffuse hypokinesis. The study is not technically sufficient to allow evaluation of LV diastolic function. - Aortic valve: There was moderate regurgitation. Valve area (VTI): 3.13 cm^2. Valve area (Vmax): 2.91 cm^2. Valve area (Vmean): 2.94 cm^2. - Mitral valve: There was mild regurgitation. - Left atrium: The atrium was mildly dilated. - Right atrium: The atrium was mildly dilated. - Atrial septum: No defect or patent foramen ovale was identified. - Pulmonary arteries: PA peak pressure: 62 mm Hg (S). - Impressions: Tech entered wrong estimate of CVP Estimated PA pressure only 62 mmHg.  Impressions:  - Tech entered wrong estimate of CVP Estimated PA pressure only 62 mmHg.    05/2017 echo Study Conclusions  - Left ventricle: The cavity size was normal. Wall thickness was increased in a pattern of severe LVH. Systolic function was normal. The estimated ejection fraction was in the range of 60% to 65%. Wall motion was normal; there were no regional wall motion abnormalities. Diastolic dysfunction, grade indeterminate. Indeterminate filling pressures. - Aortic valve: Trileaflet; normal thickness leaflets. There was moderate regurgitation. - Aorta: Mild aortic root dilatation. Aortic  root dimension: 42 mm (ED). - Mitral valve: There was mild regurgitation. - Right ventricle: The cavity size was moderately dilated. Systolic function was moderately reduced. - Right atrium: The atrium was moderately to severely dilated. - Atrial septum: No defect or patent foramen ovale was identified. - Tricuspid valve: There was mild-moderate regurgitation. - Pulmonary arteries: Systolic pressure was severely increased. PA peak pressure: 88 mm Hg (S).    09/2018 echo IMPRESSIONS   1. The left ventricle has normal systolic function with an ejection fraction of 60-65%. The cavity size was normal. There is severe asymmetric left ventricular hypertrophy. Left ventricular diastolic Doppler parameters are consistent with impaired  relaxation There is right ventricular volume and pressure overload. 2. The right ventricle has severely reduced systolic function. The cavity was severely enlarged. There is mildly increased right ventricular wall thickness. Right ventricular systolic pressure is moderately elevated with an estimated pressure of 55.8  mmHg. 3. Right atrial size was  severely dilated. 4. Small pericardial effusion. 5. The pericardial effusion is circumferential. 6. The mitral valve is normal in structure. 7. The tricuspid valve is normal in structure. Tricuspid valve regurgitation is moderate. 8. The aortic valve is tricuspid Mild thickening of the aortic valve Aortic valve regurgitation is moderate by color flow Doppler. mild stenosis of the aortic valve. 9. There is mild dilatation of the aortic root. 10. Pulmonary hypertension is moderate. 11. The inferior vena cava was dilated in size with <50% respiratory variability.   Assessment and Plan   1. LVH/Chronic diastolic HF - moderate asymmetric hypertrophy, MRI findings not consistent with hypertrophic CM. Pattern questionable for amyloid, SPEPand UPEP overall unremarkable. - LVEF has since  normalized  - he is euvolemic today, continue current meds   2. Pulmonary HTNwith RV failure -severe by echo. History of COPD and rheumatoid arthtitis - I have strongly recommended RHC for patient,he has continously refused and continues to refuse  - continue current therapy at this time.      Antoine Poche, M.D.

## 2019-10-23 NOTE — Patient Instructions (Signed)
Medication Instructions:  Your physician recommends that you continue on your current medications as directed. Please refer to the Current Medication list given to you today.  *If you need a refill on your cardiac medications before your next appointment, please call your pharmacy*   Lab Work: None today If you have labs (blood work) drawn today and your tests are completely normal, you will receive your results only by: . MyChart Message (if you have MyChart) OR . A paper copy in the mail If you have any lab test that is abnormal or we need to change your treatment, we will call you to review the results.   Testing/Procedures: None today   Follow-Up: At [facility], you and your health needs are our priority.  As part of our continuing mission to provide you with exceptional heart care, we have created designated Provider Care Teams.  These Care Teams include your primary Cardiologist (physician) and Advanced Practice Providers (APPs -  Physician Assistants and Nurse Practitioners) who all work together to provide you with the care you need, when you need it.  We recommend signing up for the patient portal called "MyChart".  Sign up information is provided on this After Visit Summary.  MyChart is used to connect with patients for Virtual Visits (Telemedicine).  Patients are able to view lab/test results, encounter notes, upcoming appointments, etc.  Non-urgent messages can be sent to your provider as well.   To learn more about what you can do with MyChart, go to [email or web address].    Your next appointment:   6 month(s)  The format for your next appointment:   In Person  Provider:   Jonathan Branch, MD   Other Instructions None       Thank you for choosing [facility] !         

## 2019-11-28 ENCOUNTER — Telehealth: Payer: Self-pay | Admitting: Cardiology

## 2019-11-28 MED ORDER — CARVEDILOL 3.125 MG PO TABS: 3.1250 mg | ORAL_TABLET | Freq: Two times a day (BID) | ORAL | 3 refills | Status: AC

## 2019-11-28 NOTE — Telephone Encounter (Signed)
Needing refill on carvedilol (COREG) 3.125 MG tablet [[id number]] ENDED Sent to [facility] on [address]

## 2019-11-28 NOTE — Telephone Encounter (Signed)
Refill complete 

## 2020-02-05 DEATH — deceased

## 2020-04-22 ENCOUNTER — Ambulatory Visit: Payer: Medicare HMO | Admitting: Cardiology

## 2020-04-22 NOTE — Progress Notes (Deleted)
Clinical Summary Derrick [name] is a 69 y.o.male seen for the following medical problems  1. Dyspnea - echo 06/2016 LVEF 45-50%, diffuse hypokinesis, cannot eval diastolic function. Moderate AI, mild MR. PASP 50 (not 150) - severe LVH. Septum 20 mm, posterior wall 18 mm - cardiac MRI 12/2016: moderate LV septal thickness 15 mm, posterior wall 9 mm. No SAM or LVOT gradient, gadolinium pattern not consistent with hypertrophic CM but consider amyloid. Moderate AI. Normal RV size and function. Normal atria. - UPEP/SPEP overall unremarkable.    - chronic SOB unchanged. - on home O2 6L chronically - no recent edema.    2.SevereCOPD - no recent cough or wheezign. Compliant with inhalers. Uses O2 at night  - breathing is stable. He is on home O2, 6L chronically - discussed seeing pulmonary, he wishes to wait until new physician establishes in this area    3.Chronic diastolic HF - during 10/2018 thought to be volume overloaded, Diursed, discharged on lasix 40mg  daily - 10/2018 echo LVEF 60-65%,grade I diastolic dysfunction,severe asymmetric LVH. RV pressure and volume overload, severe RV dysfunction. RVSP 56, mod TR, mild AS, mod AI  - no recent edema - compliant with meds. Taking lasix 40mg  once daily.   4. Pulmonary hypertensionwith RV failure - echo 05/2017 with PASP 88. Could not evaluate diastolic function, normal LA size would argue against significant dysfunction - 09/2018 echo LVEF 60-65%, RV pressure and volume overload, severe RV dysfunction and enlargement, RVSP 56 mmHg, mod TR - he does have history of COPD, rheumatoid arthritis as well. +ANA previously - he has refused RHCrepeatedly    5. NSVT - wide complex rhythm during admission 07/2017 reported as NSVT, does not appear patient evaluated by cardiology at the time. Longest run reported at 30 beats. Thoughts due to use of beta agonists for his COPD exacberation. Started on metoprolol.  - no  recent palpitatons.     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 Allergic rhinitis due to pollen 09/08/2009  . CHF (congestive heart failure) (HCC)   . COPD (chronic obstructive pulmonary disease) (HCC) 03/14/2013  . 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 Family history of diabetes mellitus 09/08/2009   father  . Family history of stroke (cerebrovascular) 09/08/2009   father  . Nicotine dependence, chewing tobacco, uncomplicated 08/07/1968  . Other disturbances of skin sensation 11/08/2012  . Perennial allergic rhinitis with seasonal variation 11/08/2012  . Pneumothorax on right 04/2019   chest tube insertion  . RA (rheumatoid arthritis) (HCC) 09/08/2009     No Known Allergies   Current Outpatient Medications  Medication Sig Dispense Refill  . albuterol (PROVENTIL HFA;VENTOLIN HFA) 108 (90 Base) MCG/ACT inhaler Inhale 1-2 puffs into the lungs every 6 (six) hours as needed for wheezing or shortness of breath. (Patient taking differently: Inhale 2 puffs into the lungs every 4 (four) hours as needed for wheezing or shortness of breath. ) 1 Inhaler 3  . albuterol (PROVENTIL) (2.5 MG/3ML) 0.083% nebulizer solution Take 3 mLs (2.5 mg total) by nebulization every 4 (four) hours as needed for wheezing or shortness of breath. 75 mL 11  . carvedilol (COREG) 3.125 MG tablet Take 1 tablet (3.125 mg total) by mouth 2 (two) times daily. 180 tablet 3  . CVS VITAMIN B12 1000  MCG tablet Take 1,000 mcg by mouth daily.   11  . feeding supplement, ENSURE ENLIVE, (ENSURE ENLIVE) LIQD Take 237 mLs by mouth 3 (three) times daily between meals. 237 mL 12  . fluticasone (FLONASE) 50 MCG/ACT nasal spray Place 1 spray into both nostrils daily. (Patient taking differently: Place 2 sprays into both  nostrils daily. ) 16 g 11  . furosemide (LASIX) 40 MG tablet 1 (40 mg) Tab po qam and half- Tab (20 mg) qpm (Patient taking differently: Take 40 mg by mouth daily. ) 135 tablet 1  . ipratropium (ATROVENT HFA) 17 MCG/ACT inhaler Inhale 1 puff into the lungs 3 (three) times daily. (Patient not taking: Reported on 10/23/2019) 1 Inhaler 2  . Multiple Vitamin (MULTIVITAMIN WITH MINERALS) TABS tablet Take 1 tablet by mouth daily. 30 tablet 0  . potassium chloride SA (KLOR-CON M20) 20 MEQ tablet Take 1 tablet (20 mEq total) by mouth daily. 90 tablet 3   No current facility-administered medications for this visit.     Past Surgical History:  Procedure Laterality Date  . CHEST TUBE INSERTION Right 04/09/2019  . DENTAL SURGERY    . VIDEO BRONCHOSCOPY WITH INSERTION OF INTERBRONCHIAL VALVE (IBV) N/A 04/11/2019   Procedure: VIDEO BRONCHOSCOPY WITH INSERTION OF INTERBRONCHIAL VALVE (IBV);  Surgeon: Corliss Skains, MD;  Location: [facility] OR;  Service: Thoracic;  Laterality: N/A;  Size 5, Size 7, Size 7     No Known Allergies    Family History  Problem Relation Age of Onset  . Hypertension Mother   . CVA Father   . Heart attack Brother      Social History Derrick [name] reports that he has quit smoking. His smoking use included cigarettes. His smokeless tobacco use includes chew. Derrick [name] reports no history of alcohol use.   Review of Systems CONSTITUTIONAL: No weight loss, fever, chills, weakness or fatigue.  HEENT: Eyes: No visual loss, blurred vision, double vision or yellow sclerae.No hearing loss, sneezing, congestion, runny nose or sore throat.  SKIN: No rash or itching.  CARDIOVASCULAR:  RESPIRATORY: No shortness of breath, cough or sputum.  GASTROINTESTINAL: No anorexia, nausea, vomiting or diarrhea. No abdominal pain or blood.  GENITOURINARY: No burning on urination, no polyuria NEUROLOGICAL: No headache, dizziness, syncope, paralysis, ataxia, numbness or tingling in the extremities. No  change in bowel or bladder control.  MUSCULOSKELETAL: No muscle, back pain, joint pain or stiffness.  LYMPHATICS: No enlarged nodes. No history of splenectomy.  PSYCHIATRIC: No history of depression or anxiety.  ENDOCRINOLOGIC: No reports of sweating, cold or heat intolerance. No polyuria or polydipsia.  Marland Kitchen   Physical Examination There were no vitals filed for this visit. There were no vitals filed for this visit.  Gen: resting comfortably, no acute distress HEENT: no scleral icterus, pupils equal round and reactive, no palptable cervical adenopathy,  CV Resp: Clear to auscultation bilaterally GI: abdomen is soft, non-tender, non-distended, normal bowel sounds, no hepatosplenomegaly MSK: extremities are warm, no edema.  Skin: warm, no rash Neuro:  no focal deficits Psych: appropriate affect   Diagnostic Studies  12/2016 Cardiac MRI FINDINGS: Both atria were normal in size. RV was normal in size and function. There was no ASD/VSD or pericardial effusion. The aortic root was mildly dilatated at 3.9 cm The LV was normal in size The septum was 15 mm in thickness with the posterior wall being 9 mm. There was no SAM or LVOT gradient. There was moderate appearing AR. The RV was normal in size  and function. The quantitative EF was 61% (EDV 120 cc ESV 46 cc SV 74 cc) Delayed enhancement images showed poor nulling of the myocardium suggesting possibility of amyloid or infiltrative DCM. There was no scar.  Note poor quality study due to motion and ectopy Most sequences done with free breathing  IMPRESSION: 1) Moderate LVH septal thickness 15 mm compared to posterior wall 9 mm. No SAM or LVOT gradient. No delayed hyperenhancement to suggest HOCM  2) Failure to null myocardium post gadolinium suggesting possibility of amyloid  3) Tri-leaflet aortic valve with moderate appearing AR  4) Mild aortic root enlargement 3.9 cm  5) Normal RV size and function  6) No pericardial  effusion  70 Normal LV size quantitative EF 61% no infarct or scar on delayed post gadolinium inversion recovery sequences  Derrick [name]  06/2017 echo Study Conclusions  - Left ventricle: Abnormal septal motion The cavity size was mildly dilated. Wall thickness was increased in a pattern of severe LVH. Systolic function was mildly reduced. The estimated ejection fraction was in the range of 45% to 50%. Diffuse hypokinesis. The study is not technically sufficient to allow evaluation of LV diastolic function. - Aortic valve: There was moderate regurgitation. Valve area (VTI): 3.13 cm^2. Valve area (Vmax): 2.91 cm^2. Valve area (Vmean): 2.94 cm^2. - Mitral valve: There was mild regurgitation. - Left atrium: The atrium was mildly dilated. - Right atrium: The atrium was mildly dilated. - Atrial septum: No defect or patent foramen ovale was identified. - Pulmonary arteries: PA peak pressure: 62 mm Hg (S). - Impressions: Tech entered wrong estimate of CVP Estimated PA pressure only 62 mmHg.  Impressions:  - Tech entered wrong estimate of CVP Estimated PA pressure only 62 mmHg.    05/2017 echo Study Conclusions  - Left ventricle: The cavity size was normal. Wall thickness was increased in a pattern of severe LVH. Systolic function was normal. The estimated ejection fraction was in the range of 60% to 65%. Wall motion was normal; there were no regional wall motion abnormalities. Diastolic dysfunction, grade indeterminate. Indeterminate filling pressures. - Aortic valve: Trileaflet; normal thickness leaflets. There was moderate regurgitation. - Aorta: Mild aortic root dilatation. Aortic root dimension: 42 mm (ED). - Mitral valve: There was mild regurgitation. - Right ventricle: The cavity size was moderately dilated. Systolic function was moderately reduced. - Right atrium: The atrium was moderately to severely dilated. - Atrial  septum: No defect or patent foramen ovale was identified. - Tricuspid valve: There was mild-moderate regurgitation. - Pulmonary arteries: Systolic pressure was severely increased. PA peak pressure: 88 mm Hg (S).    09/2018 echo IMPRESSIONS   1. The left ventricle has normal systolic function with an ejection fraction of 60-65%. The cavity size was normal. There is severe asymmetric left ventricular hypertrophy. Left ventricular diastolic Doppler parameters are consistent with impaired  relaxation There is right ventricular volume and pressure overload. 2. The right ventricle has severely reduced systolic function. The cavity was severely enlarged. There is mildly increased right ventricular wall thickness. Right ventricular systolic pressure is moderately elevated with an estimated pressure of 55.8  mmHg. 3. Right atrial size was severely dilated. 4. Small pericardial effusion. 5. The pericardial effusion is circumferential. 6. The mitral valve is normal in structure. 7. The tricuspid valve is normal in structure. Tricuspid valve regurgitation is moderate. 8. The aortic valve is tricuspid Mild thickening of the aortic valve Aortic valve regurgitation is moderate by color flow Doppler. mild stenosis of the aortic  valve. 9. There is mild dilatation of the aortic root. 10. Pulmonary hypertension is moderate. 11. The inferior vena cava was dilated in size with <50% respiratory variability.   Assessment and Plan    1. LVH/Chronic diastolic HF - moderate asymmetric hypertrophy, MRI findings not consistent with hypertrophic CM. Pattern questionable for amyloid, SPEPand UPEP overall unremarkable. - LVEF has since normalized  - he is euvolemic today, continue current meds  2. Pulmonary HTNwith RV failure -severe by echo. History of COPD and rheumatoid arthtitis - I have strongly recommended RHC for patient,he has continously refused and continues to refuse  -  continue current therapy at this time.      Antoine Poche, M.D., F.A.C.C.

## 2020-04-23 ENCOUNTER — Encounter: Payer: Self-pay | Admitting: Cardiology
